# Patient Record
Sex: Female | Born: 1965 | Race: Black or African American | Hispanic: No | Marital: Married | Smoking: Never smoker
Health system: Southern US, Community
[De-identification: ages and names within clinical notes are randomized; demographics above are authoritative.]

## PROBLEM LIST (undated history)

## (undated) DIAGNOSIS — Z9221 Personal history of antineoplastic chemotherapy: Secondary | ICD-10-CM

## (undated) DIAGNOSIS — Z8042 Family history of malignant neoplasm of prostate: Secondary | ICD-10-CM

## (undated) DIAGNOSIS — K219 Gastro-esophageal reflux disease without esophagitis: Secondary | ICD-10-CM

## (undated) DIAGNOSIS — C50919 Malignant neoplasm of unspecified site of unspecified female breast: Secondary | ICD-10-CM

## (undated) DIAGNOSIS — I10 Essential (primary) hypertension: Secondary | ICD-10-CM

## (undated) DIAGNOSIS — Z803 Family history of malignant neoplasm of breast: Secondary | ICD-10-CM

## (undated) DIAGNOSIS — J45909 Unspecified asthma, uncomplicated: Secondary | ICD-10-CM

## (undated) DIAGNOSIS — Z8489 Family history of other specified conditions: Secondary | ICD-10-CM

## (undated) DIAGNOSIS — Z8049 Family history of malignant neoplasm of other genital organs: Secondary | ICD-10-CM

## (undated) HISTORY — DX: Unspecified asthma, uncomplicated: J45.909

## (undated) HISTORY — DX: Family history of malignant neoplasm of prostate: Z80.42

## (undated) HISTORY — DX: Family history of malignant neoplasm of other genital organs: Z80.49

## (undated) HISTORY — PX: ABLATION: SHX5711

## (undated) HISTORY — DX: Malignant neoplasm of unspecified site of unspecified female breast: C50.919

## (undated) HISTORY — DX: Essential (primary) hypertension: I10

## (undated) HISTORY — DX: Family history of malignant neoplasm of breast: Z80.3

---

## 2009-11-09 HISTORY — PX: BREAST BIOPSY: SHX20

## 2018-01-20 ENCOUNTER — Inpatient Hospital Stay
Admission: RE | Admit: 2018-01-20 | Discharge: 2018-01-20 | Disposition: A | Discharge status: Home / Self Care | Payer: Self-pay | Source: Ambulatory Visit | Attending: *Deleted | Admitting: *Deleted

## 2018-01-20 ENCOUNTER — Other Ambulatory Visit: Payer: Self-pay | Admitting: *Deleted

## 2018-01-20 DIAGNOSIS — Z9289 Personal history of other medical treatment: Secondary | ICD-10-CM

## 2018-01-24 ENCOUNTER — Other Ambulatory Visit: Payer: Self-pay | Admitting: Internal Medicine

## 2018-01-24 DIAGNOSIS — Z1231 Encounter for screening mammogram for malignant neoplasm of breast: Secondary | ICD-10-CM

## 2018-01-28 ENCOUNTER — Ambulatory Visit
Admission: RE | Admit: 2018-01-28 | Discharge: 2018-01-28 | Disposition: A | Discharge status: Home / Self Care | Payer: 59 | Source: Ambulatory Visit | Attending: Specialist | Admitting: Specialist

## 2018-01-28 ENCOUNTER — Other Ambulatory Visit: Payer: Self-pay | Admitting: Specialist

## 2018-01-28 DIAGNOSIS — M25469 Effusion, unspecified knee: Secondary | ICD-10-CM

## 2018-01-28 DIAGNOSIS — M79662 Pain in left lower leg: Secondary | ICD-10-CM | POA: Diagnosis not present

## 2018-01-28 DIAGNOSIS — M7989 Other specified soft tissue disorders: Secondary | ICD-10-CM | POA: Diagnosis not present

## 2018-02-04 ENCOUNTER — Ambulatory Visit
Admission: RE | Admit: 2018-02-04 | Discharge: 2018-02-04 | Disposition: A | Discharge status: Home / Self Care | Payer: 59 | Source: Ambulatory Visit | Attending: Internal Medicine | Admitting: Internal Medicine

## 2018-02-04 DIAGNOSIS — Z1231 Encounter for screening mammogram for malignant neoplasm of breast: Secondary | ICD-10-CM | POA: Diagnosis not present

## 2019-09-26 ENCOUNTER — Other Ambulatory Visit: Payer: Self-pay | Admitting: Family

## 2019-09-26 DIAGNOSIS — Z1231 Encounter for screening mammogram for malignant neoplasm of breast: Secondary | ICD-10-CM

## 2019-12-04 ENCOUNTER — Ambulatory Visit: Payer: No Typology Code available for payment source | Attending: Internal Medicine

## 2020-01-15 HISTORY — PX: COLONOSCOPY: SHX174

## 2020-02-11 DIAGNOSIS — Z421 Encounter for breast reconstruction following mastectomy: Secondary | ICD-10-CM

## 2020-02-11 DIAGNOSIS — C50912 Malignant neoplasm of unspecified site of left female breast: Secondary | ICD-10-CM | POA: Diagnosis not present

## 2020-04-10 DIAGNOSIS — J329 Chronic sinusitis, unspecified: Secondary | ICD-10-CM | POA: Diagnosis not present

## 2020-04-10 DIAGNOSIS — J309 Allergic rhinitis, unspecified: Secondary | ICD-10-CM | POA: Diagnosis not present

## 2020-04-23 DIAGNOSIS — J301 Allergic rhinitis due to pollen: Secondary | ICD-10-CM | POA: Diagnosis not present

## 2020-05-08 DIAGNOSIS — J309 Allergic rhinitis, unspecified: Secondary | ICD-10-CM | POA: Diagnosis not present

## 2020-05-13 DIAGNOSIS — L237 Allergic contact dermatitis due to plants, except food: Secondary | ICD-10-CM | POA: Diagnosis not present

## 2020-05-24 DIAGNOSIS — J301 Allergic rhinitis due to pollen: Secondary | ICD-10-CM | POA: Diagnosis not present

## 2020-06-03 DIAGNOSIS — J301 Allergic rhinitis due to pollen: Secondary | ICD-10-CM | POA: Diagnosis not present

## 2020-06-06 DIAGNOSIS — J301 Allergic rhinitis due to pollen: Secondary | ICD-10-CM | POA: Diagnosis not present

## 2020-06-13 DIAGNOSIS — J301 Allergic rhinitis due to pollen: Secondary | ICD-10-CM | POA: Diagnosis not present

## 2020-06-17 DIAGNOSIS — J301 Allergic rhinitis due to pollen: Secondary | ICD-10-CM | POA: Diagnosis not present

## 2020-06-18 DIAGNOSIS — J301 Allergic rhinitis due to pollen: Secondary | ICD-10-CM | POA: Diagnosis not present

## 2020-06-20 DIAGNOSIS — J301 Allergic rhinitis due to pollen: Secondary | ICD-10-CM | POA: Diagnosis not present

## 2020-06-24 DIAGNOSIS — J301 Allergic rhinitis due to pollen: Secondary | ICD-10-CM | POA: Diagnosis not present

## 2020-06-27 DIAGNOSIS — J301 Allergic rhinitis due to pollen: Secondary | ICD-10-CM | POA: Diagnosis not present

## 2020-07-01 DIAGNOSIS — Z20822 Contact with and (suspected) exposure to covid-19: Secondary | ICD-10-CM | POA: Diagnosis not present

## 2020-07-01 DIAGNOSIS — B9689 Other specified bacterial agents as the cause of diseases classified elsewhere: Secondary | ICD-10-CM | POA: Diagnosis not present

## 2020-07-01 DIAGNOSIS — J329 Chronic sinusitis, unspecified: Secondary | ICD-10-CM | POA: Diagnosis not present

## 2020-07-04 DIAGNOSIS — J301 Allergic rhinitis due to pollen: Secondary | ICD-10-CM | POA: Diagnosis not present

## 2020-07-18 DIAGNOSIS — J301 Allergic rhinitis due to pollen: Secondary | ICD-10-CM | POA: Diagnosis not present

## 2020-07-24 DIAGNOSIS — J301 Allergic rhinitis due to pollen: Secondary | ICD-10-CM | POA: Diagnosis not present

## 2020-08-12 DIAGNOSIS — J301 Allergic rhinitis due to pollen: Secondary | ICD-10-CM | POA: Diagnosis not present

## 2020-08-22 DIAGNOSIS — J301 Allergic rhinitis due to pollen: Secondary | ICD-10-CM | POA: Diagnosis not present

## 2020-08-29 DIAGNOSIS — J301 Allergic rhinitis due to pollen: Secondary | ICD-10-CM | POA: Diagnosis not present

## 2020-09-10 DIAGNOSIS — J301 Allergic rhinitis due to pollen: Secondary | ICD-10-CM | POA: Diagnosis not present

## 2020-09-12 DIAGNOSIS — J301 Allergic rhinitis due to pollen: Secondary | ICD-10-CM | POA: Diagnosis not present

## 2020-09-19 DIAGNOSIS — J301 Allergic rhinitis due to pollen: Secondary | ICD-10-CM | POA: Diagnosis not present

## 2020-09-26 DIAGNOSIS — J301 Allergic rhinitis due to pollen: Secondary | ICD-10-CM | POA: Diagnosis not present

## 2020-09-30 DIAGNOSIS — Z113 Encounter for screening for infections with a predominantly sexual mode of transmission: Secondary | ICD-10-CM | POA: Diagnosis not present

## 2020-09-30 DIAGNOSIS — R7302 Impaired glucose tolerance (oral): Secondary | ICD-10-CM | POA: Diagnosis not present

## 2020-09-30 DIAGNOSIS — I10 Essential (primary) hypertension: Secondary | ICD-10-CM | POA: Diagnosis not present

## 2020-09-30 DIAGNOSIS — J309 Allergic rhinitis, unspecified: Secondary | ICD-10-CM | POA: Diagnosis not present

## 2020-09-30 DIAGNOSIS — E782 Mixed hyperlipidemia: Secondary | ICD-10-CM | POA: Diagnosis not present

## 2020-09-30 DIAGNOSIS — E559 Vitamin D deficiency, unspecified: Secondary | ICD-10-CM | POA: Diagnosis not present

## 2020-09-30 DIAGNOSIS — Z0000 Encounter for general adult medical examination without abnormal findings: Secondary | ICD-10-CM | POA: Diagnosis not present

## 2020-09-30 DIAGNOSIS — J4520 Mild intermittent asthma, uncomplicated: Secondary | ICD-10-CM | POA: Diagnosis not present

## 2020-09-30 DIAGNOSIS — Z124 Encounter for screening for malignant neoplasm of cervix: Secondary | ICD-10-CM | POA: Diagnosis not present

## 2020-09-30 DIAGNOSIS — Z112 Encounter for screening for other bacterial diseases: Secondary | ICD-10-CM | POA: Diagnosis not present

## 2020-09-30 DIAGNOSIS — Z1151 Encounter for screening for human papillomavirus (HPV): Secondary | ICD-10-CM | POA: Diagnosis not present

## 2020-09-30 DIAGNOSIS — N760 Acute vaginitis: Secondary | ICD-10-CM | POA: Diagnosis not present

## 2020-10-10 DIAGNOSIS — J301 Allergic rhinitis due to pollen: Secondary | ICD-10-CM | POA: Diagnosis not present

## 2020-10-17 DIAGNOSIS — J301 Allergic rhinitis due to pollen: Secondary | ICD-10-CM | POA: Diagnosis not present

## 2020-10-24 DIAGNOSIS — J301 Allergic rhinitis due to pollen: Secondary | ICD-10-CM | POA: Diagnosis not present

## 2020-11-07 DIAGNOSIS — J301 Allergic rhinitis due to pollen: Secondary | ICD-10-CM | POA: Diagnosis not present

## 2020-11-08 DIAGNOSIS — M170 Bilateral primary osteoarthritis of knee: Secondary | ICD-10-CM | POA: Diagnosis not present

## 2020-11-09 HISTORY — PX: MASTECTOMY: SHX3

## 2020-11-14 DIAGNOSIS — J301 Allergic rhinitis due to pollen: Secondary | ICD-10-CM | POA: Diagnosis not present

## 2020-11-19 ENCOUNTER — Ambulatory Visit
Admission: RE | Admit: 2020-11-19 | Discharge: 2020-11-19 | Disposition: A | Discharge status: Home / Self Care | Payer: No Typology Code available for payment source | Source: Ambulatory Visit | Attending: Family | Admitting: Family

## 2020-11-19 ENCOUNTER — Other Ambulatory Visit: Payer: Self-pay

## 2020-11-19 DIAGNOSIS — Z1231 Encounter for screening mammogram for malignant neoplasm of breast: Secondary | ICD-10-CM | POA: Insufficient documentation

## 2020-11-21 DIAGNOSIS — J301 Allergic rhinitis due to pollen: Secondary | ICD-10-CM | POA: Diagnosis not present

## 2020-11-26 ENCOUNTER — Other Ambulatory Visit: Payer: Self-pay | Admitting: Family

## 2020-11-26 DIAGNOSIS — R921 Mammographic calcification found on diagnostic imaging of breast: Secondary | ICD-10-CM

## 2020-11-26 DIAGNOSIS — R928 Other abnormal and inconclusive findings on diagnostic imaging of breast: Secondary | ICD-10-CM

## 2020-11-28 DIAGNOSIS — J301 Allergic rhinitis due to pollen: Secondary | ICD-10-CM | POA: Diagnosis not present

## 2020-12-04 ENCOUNTER — Ambulatory Visit
Admission: RE | Admit: 2020-12-04 | Discharge: 2020-12-04 | Disposition: A | Discharge status: Home / Self Care | Payer: No Typology Code available for payment source | Source: Ambulatory Visit | Attending: Family | Admitting: Family

## 2020-12-04 ENCOUNTER — Other Ambulatory Visit: Payer: Self-pay

## 2020-12-04 ENCOUNTER — Other Ambulatory Visit: Payer: Self-pay | Admitting: Family

## 2020-12-04 DIAGNOSIS — R921 Mammographic calcification found on diagnostic imaging of breast: Secondary | ICD-10-CM

## 2020-12-04 DIAGNOSIS — N6489 Other specified disorders of breast: Secondary | ICD-10-CM | POA: Insufficient documentation

## 2020-12-04 DIAGNOSIS — R928 Other abnormal and inconclusive findings on diagnostic imaging of breast: Secondary | ICD-10-CM | POA: Insufficient documentation

## 2020-12-05 DIAGNOSIS — J301 Allergic rhinitis due to pollen: Secondary | ICD-10-CM | POA: Diagnosis not present

## 2020-12-06 ENCOUNTER — Other Ambulatory Visit: Payer: Self-pay | Admitting: Family

## 2020-12-06 DIAGNOSIS — J301 Allergic rhinitis due to pollen: Secondary | ICD-10-CM | POA: Diagnosis not present

## 2020-12-06 DIAGNOSIS — N6320 Unspecified lump in the left breast, unspecified quadrant: Secondary | ICD-10-CM

## 2020-12-06 DIAGNOSIS — R921 Mammographic calcification found on diagnostic imaging of breast: Secondary | ICD-10-CM

## 2020-12-06 DIAGNOSIS — R928 Other abnormal and inconclusive findings on diagnostic imaging of breast: Secondary | ICD-10-CM

## 2020-12-12 ENCOUNTER — Ambulatory Visit
Admission: RE | Admit: 2020-12-12 | Discharge: 2020-12-12 | Disposition: A | Discharge status: Home / Self Care | Payer: No Typology Code available for payment source | Source: Ambulatory Visit | Attending: Family | Admitting: Family

## 2020-12-12 ENCOUNTER — Other Ambulatory Visit: Payer: Self-pay | Admitting: Family

## 2020-12-12 ENCOUNTER — Other Ambulatory Visit: Payer: Self-pay

## 2020-12-12 DIAGNOSIS — D0512 Intraductal carcinoma in situ of left breast: Secondary | ICD-10-CM | POA: Diagnosis not present

## 2020-12-12 DIAGNOSIS — J301 Allergic rhinitis due to pollen: Secondary | ICD-10-CM | POA: Diagnosis not present

## 2020-12-12 DIAGNOSIS — R921 Mammographic calcification found on diagnostic imaging of breast: Secondary | ICD-10-CM

## 2020-12-12 DIAGNOSIS — N6320 Unspecified lump in the left breast, unspecified quadrant: Secondary | ICD-10-CM | POA: Diagnosis not present

## 2020-12-12 DIAGNOSIS — R928 Other abnormal and inconclusive findings on diagnostic imaging of breast: Secondary | ICD-10-CM

## 2020-12-12 DIAGNOSIS — C50412 Malignant neoplasm of upper-outer quadrant of left female breast: Secondary | ICD-10-CM | POA: Diagnosis not present

## 2020-12-12 DIAGNOSIS — N6489 Other specified disorders of breast: Secondary | ICD-10-CM | POA: Diagnosis not present

## 2020-12-12 HISTORY — PX: BREAST BIOPSY: SHX20

## 2020-12-13 ENCOUNTER — Encounter: Payer: Self-pay | Admitting: *Deleted

## 2020-12-13 DIAGNOSIS — C50912 Malignant neoplasm of unspecified site of left female breast: Secondary | ICD-10-CM

## 2020-12-13 NOTE — Progress Notes (Signed)
Received message from Electa Sniff, RN that patient had been notified of her new diagnosis of invasive mammary carcinoma of the left breast and was ready for navigation.  Called patient.  She and her husband were on the phone.  She would like to go stay local with her care.  [facility]ires [facility].  Reviewed pathology and answered questions.  Explained it would be Monday before I could schedule her appointments,  [place] number was given to the patient to call with any questions or needs.

## 2020-12-16 ENCOUNTER — Encounter: Payer: Self-pay | Admitting: *Deleted

## 2020-12-16 NOTE — Progress Notes (Signed)
Called patient back today with appointment to see Dr. Rogue Bussing on 12/18/20 @ 9:30 and Dr. Christian Mate on 2/10/ @ 3:45.  Questions were answered.  Will give educational material at her medical oncology appointment.

## 2020-12-17 ENCOUNTER — Inpatient Hospital Stay: Payer: No Typology Code available for payment source | Attending: Internal Medicine | Admitting: Internal Medicine

## 2020-12-17 ENCOUNTER — Encounter: Payer: Self-pay | Admitting: Internal Medicine

## 2020-12-17 ENCOUNTER — Encounter: Payer: Self-pay | Admitting: *Deleted

## 2020-12-17 ENCOUNTER — Inpatient Hospital Stay: Payer: No Typology Code available for payment source

## 2020-12-17 DIAGNOSIS — D0512 Intraductal carcinoma in situ of left breast: Secondary | ICD-10-CM | POA: Diagnosis not present

## 2020-12-17 DIAGNOSIS — D4862 Neoplasm of uncertain behavior of left breast: Secondary | ICD-10-CM

## 2020-12-17 NOTE — Progress Notes (Signed)
Met patient today during her medical oncology consult with Dr. Rogue Bussing.  Her husband was available via the phone.  ER/PR/Her2 are still pending.  Patient is scheduled to see surgeon on 12/19/20.  Patient is to return to see Dr. Rogue Bussing after her surgery.  Gave patient breast cancer educational literature, "My Breast Cancer Treatment Handbook" by Josephine Igo, RN.  She is to call with any questions or needs.

## 2020-12-17 NOTE — Progress Notes (Signed)
[facility] [name] NOTE  Patient Care Team: [name], MD as PCP - General (Internal Medicine)  CHIEF COMPLAINTS/PURPOSE OF CONSULTATION: Breast cancer  # On physical exam, there is a small firm palpable area in the lateral aspect of the left breast.  Targeted ultrasound is performed, showing an irregular shadowing hypoechoic mass at 3:30, 3 cm from the nipple measuring 1.4 x 0.8 x 1.0 cm.  Slightly closer to the nipple is another irregular hypoechoic mass with indistinct margins which has a linear echogenic focus, which may represent the biopsy marking clip from a prior biopsy. The associated mass measures 0.7 x 0.3 x 0.6 cm.  Closer to the nipple again at 3:30, 1 cm from the nipple is a small irregular hypoechoic mass measuring 0.6 x 0.3 x 0.5 cm. There is an internal echogenic focus, possibly a calcification.  All together, the masses at 3:30, 1-3 cm from the nipple are in a linear array and span approximately 3.7 cm.  DIAGNOSIS:  A. BREAST, LEFT AT 3:30 O'CLOCK, 3 CM FROM THE NIPPLE; ULTRASOUND-GUIDED  CORE NEEDLE BIOPSY:  - INVASIVE MAMMARY CARCINOMA, NO SPECIAL TYPE.   Size of invasive carcinoma: 11 mm in this sample  Histologic grade of invasive carcinoma: Grade 3            Glandular/tubular differentiation score: 3            Nuclear pleomorphism score: 3            Mitotic rate score: 3            Total score: 9  Ductal carcinoma in situ: Present, high-grade with comedonecrosis  Lymphovascular invasion: Not identified   ER/PR/HER2: Immunohistochemistry will be performed on block A1, with  reflex to [place] for HER2 2+. The results will be reported in an addendum.   B. BREAST, LEFT AT 3:30 O'CLOCK, 1 CM FROM THE NIPPLE; ULTRASOUND-GUIDED  CORE NEEDLE BIOPSY:  - DUCTAL CARCINOMA IN SITU, HIGH-GRADE.  - NO EVIDENCE OF INFILTRATING MAMMARY CARCINOMA.   #A] 3 cm from the nipple measuring 1.4 x 0.8 x  1.0 cm. 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     HISTORY OF PRESENTING ILLNESS:  Judy Padilla 55 y.o.  female female with no prior history of breast cancer/or malignancies has been referred to [place] for further evaluation recommendations for new diagnosis of breast cancer.  Patient stated that she had a breast biopsy 10 years ago on the left breast; marker placed.  She states to have mammogram on a yearly basis.  Surveillance mammogram in 2019.  Disrupted because of Covid pandemic.   Patient states she was found to have an abnormal screening mammogram which led to diagnostic mammogram/ultrasound/followed by biopsy-as summarized above.  Patient has intermittent hot flashes.  Otherwise denies any antihormone therapy.  Used OCP: for 1 year  Used estrogen and progesterone therapy: none History of Radiation to the chest: none  Number of pregnancies: one pregnancy.  Previous biopsy: 10 years ago; needed closer follow up. Last mammogram 2 years ago/sec to pandemic.  Last menstrual cycle- 2020 mid;  2014-2015 uterine ablation;    Review of Systems  Constitutional: Negative for chills, diaphoresis, fever, malaise/fatigue and weight loss.  HENT: Negative for nosebleeds and sore throat.   Eyes: Negative for double vision.  Respiratory:  Negative for cough, hemoptysis, sputum production, shortness of breath and wheezing.   Cardiovascular: Negative for chest pain, palpitations, orthopnea and leg swelling.  Gastrointestinal: Negative for abdominal pain, blood in stool, constipation, diarrhea, heartburn, melena, nausea and vomiting.  Genitourinary: Negative for dysuria, frequency and urgency.  Musculoskeletal: Negative for back pain and joint pain.  Skin: Negative.  Negative for itching and rash.  Neurological: Negative for dizziness,  tingling, focal weakness, weakness and headaches.  Endo/Heme/Allergies: Does not bruise/bleed easily.  Psychiatric/Behavioral: Negative for depression. The patient is not nervous/anxious and does not have insomnia.      MEDICAL HISTORY:  Past Medical History:  Diagnosis Date  . Asthma   . Breast cancer ([place])   . Hypertension     SURGICAL HISTORY: Past Surgical History:  Procedure Laterality Date  . BREAST BIOPSY Left 2011   Benign per pt  . BREAST BIOPSY Left 12/12/2020   3:30 3 cmfn, Q marker, path pending  . BREAST BIOPSY Left 12/12/2020   3:30 1 cmfn, Vision marker, path pending  . COLONOSCOPY  01/15/2020    SOCIAL HISTORY: Social History   Socioeconomic History  . Marital status: Married    Spouse name: Not on file  . Number of children: Not on file  . Years of education: Not on file  . Highest education level: Not on file  Occupational History  . Not on file  Tobacco Use  . Smoking status: Never Smoker  . Smokeless tobacco: Never Used  Substance and Sexual Activity  . Alcohol use: Not Currently  . Drug use: Never  . Sexual activity: Not on file  Other Topics Concern  . Not on file  Social History Narrative   Lives in [place] with husband; kids- college. Works for [employer]- working from home. No smoking or alcohol.    Social Determinants of Health   Financial Resource Strain: Not on file  Food Insecurity: Not on file  Transportation Needs: Not on file  Physical Activity: Not on file  Stress: Not on file  Social Connections: Not on file  Intimate Partner Violence: Not on file    FAMILY HISTORY: Family History  Problem Relation Age of Onset  . Hypertension Mother   . Diabetes Mother   . Hypertension Father   . Diabetes Father   . Cancer Father        prostate cancer-70s  . Cancer Maternal Grandmother        uterine cancer  . Breast cancer Sister        in in 67s.     ALLERGIES:  is allergic to acetaminophen-caffeine and  shrimp extract allergy skin test.  MEDICATIONS:  Current Outpatient Medications  Medication Sig Dispense Refill  . albuterol (VENTOLIN HFA) 108 (90 Base) MCG/ACT inhaler Inhale 1 puff into the lungs as needed for shortness of breath.    Marland Kitchen amLODipine (NORVASC) 10 MG tablet Take 1 tablet by mouth daily.    Marland Kitchen atorvastatin (LIPITOR) 20 MG tablet Take 1 tablet by mouth daily.    . budesonide (PULMICORT) 0.5 MG/2ML nebulizer solution Take 2 mLs by nebulization as needed for shortness of breath.    . cetirizine (ZYRTEC) 10 MG tablet Take 1 tablet by mouth daily.    . Cholecalciferol 1.25 MG (50000 UT) capsule Take 1 capsule by mouth once a week.    Marland Kitchen EPINEPHrine 0.3 mg/0.3 mL IJ SOAJ injection epinephrine 0.3 mg/0.3 mL injection, auto-injector    . fluticasone (FLONASE) 50 MCG/ACT nasal spray Place 1  spray into both nostrils daily.    . fluticasone furoate-vilanterol (BREO ELLIPTA) 100-25 MCG/INH AEPB Inhale 1 puff into the lungs as needed for shortness of breath.    . naproxen sodium (ALEVE) 220 MG tablet Take 1 tablet by mouth in the morning and at bedtime.    . pantoprazole (PROTONIX) 40 MG tablet Take 1 tablet by mouth daily.     No current facility-administered medications for this visit.      Marland Kitchen  PHYSICAL EXAMINATION: ECOG PERFORMANCE STATUS: 0 - Asymptomatic  Vitals:   12/17/20 0954  BP: 121/88  Pulse: 76  Resp: 16  Temp: (!) 96.5 F (35.8 C)  SpO2: 100%   Filed Weights   12/17/20 0954  Weight: 192 lb (87.1 kg)    Physical Exam Constitutional:      Comments: Alone; ambulating independently.  HENT:     Head: Normocephalic and atraumatic.     Mouth/Throat:     Mouth: Oropharynx is clear and moist.     Pharynx: No oropharyngeal exudate.  Eyes:     Pupils: Pupils are equal, round, and reactive to light.  Cardiovascular:     Rate and Rhythm: Normal rate and regular rhythm.  Pulmonary:     Effort: Pulmonary effort is normal. No respiratory distress.     Breath sounds:  Normal breath sounds. No wheezing.  Abdominal:     General: Bowel sounds are normal. There is no distension.     Palpations: Abdomen is soft. There is no mass.     Tenderness: There is no abdominal tenderness. There is no guarding or rebound.  Musculoskeletal:        General: No tenderness or edema. Normal range of motion.     Cervical back: Normal range of motion and neck supple.  Skin:    General: Skin is warm.     Comments: Right and left BREAST exam (in the presence of nurse)- no unusual skin changes. ~1-2 cm nodules felt in the left breast 3 o'clock position.  Mobile.  Nontender.  Biopsy changes noted.   Neurological:     Mental Status: She is alert and oriented to person, place, and time.  Psychiatric:        Mood and Affect: Affect normal.      LABORATORY DATA:  I have reviewed the data as listed No results found for: WBC, HGB, HCT, MCV, PLT No results for input(s): NA, K, CL, CO2, GLUCOSE, BUN, CREATININE, CALCIUM, GFRNONAA, GFRAA, PROT, ALBUMIN, AST, ALT, ALKPHOS, BILITOT, BILIDIR, IBILI in the last 8760 hours.  RADIOGRAPHIC STUDIES: I have personally reviewed the radiological images as listed and agreed with the findings in the report. MM Digital Diagnostic Unilat L  Result Date: 12/04/2020 CLINICAL DATA:  55 year old female presenting as a recall from screening for possible left breast calcifications. EXAM: DIGITAL DIAGNOSTIC LEFT MAMMOGRAM WITH TOMOSYNTHESIS TECHNIQUE: Left digital diagnostic mammography and breast tomosynthesis was performed. COMPARISON:  Previous exam(s). ACR Breast Density Category c: The breast tissue is heterogeneously dense, which may obscure small masses. FINDINGS: Spot 2D magnification views and full field mL views of the left breast performed. There are grouped pleomorphic calcifications in the outer left breast spanning 1.8 cm. There is an associated asymmetry. No additional new findings identified elsewhere in the left breast. IMPRESSION:  Suspicious calcifications spanning 1.8 cm in the outer left breast with an associated asymmetry. Patient was not able to stay for recommended left breast ultrasound. RECOMMENDATION: 1.  Left breast ultrasound. 2. If a mass is  identified in the outer left breast associated with the calcifications seen mammographically, recommend ultrasound-guided core needle biopsy of this area. If no sonographic correlate is identified, recommend stereotactic core needle biopsy of the left breast calcifications. I was not able to discussed the recommendations with the patient in person today because she left in order to return to work. She will be contacted by our scheduler to make the follow-up appointments. BI-RADS CATEGORY  4: Suspicious. Electronically Signed   By: Audie Pinto M.D.   On: 12/04/2020 15:32   US BREAST LTD UNI LEFT INC AXILLA  Result Date: 12/12/2020 CLINICAL DATA:  55 year old female presenting for ultrasound to complete the diagnostic workup of a left breast asymmetry with calcifications identified on her screening mammogram. EXAM: ULTRASOUND OF THE LEFT BREAST COMPARISON:  Previous exam(s). FINDINGS: On physical exam, there is a small firm palpable area in the lateral aspect of the left breast. Targeted ultrasound is performed, showing an irregular shadowing hypoechoic mass at 3:30, 3 cm from the nipple measuring 1.4 x 0.8 x 1.0 cm. Slightly closer to the nipple is another irregular hypoechoic mass with indistinct margins which has a linear echogenic focus, which may represent the biopsy marking clip from a prior biopsy. The associated mass measures 0.7 x 0.3 x 0.6 cm. Closer to the nipple again at 3:30, 1 cm from the nipple is a small irregular hypoechoic mass measuring 0.6 x 0.3 x 0.5 cm. There is an internal echogenic focus, possibly a calcification. All together, the masses at 3:30, 1-3 cm from the nipple are in a linear array and span approximately 3.7 cm. Ultrasound of the left axilla demonstrates  multiple normal-appearing lymph nodes. IMPRESSION: 1. There are 3 masses in a linear array at 3:30 which span approximately 3.7 cm. 2.  No evidence of left axillary lymphadenopathy. RECOMMENDATION: Ultrasound guided biopsy is recommended for the left breast mass at 3:30, 1 cm from the nipple and at 3:30, 3 cm from the nipple. This will be performed today, and dictated in a separate report. I have discussed the findings and recommendations with the patient. If applicable, a reminder letter will be sent to the patient regarding the next appointment. BI-RADS CATEGORY  5: Highly suggestive of malignancy. Electronically Signed   By: Ammie Ferrier M.D.   On: 12/12/2020 09:24   MM 3D SCREEN BREAST BILATERAL  Result Date: 11/19/2020 CLINICAL DATA:  Screening. EXAM: DIGITAL SCREENING BILATERAL MAMMOGRAM WITH TOMO AND CAD COMPARISON:  Previous exam(s). ACR Breast Density Category c: The breast tissue is heterogeneously dense, which may obscure small masses. FINDINGS: In the left breast, calcifications in the outer aspect of the breast in the middle depth warrant further evaluation. In the right breast, no findings suspicious for malignancy. Images were processed with CAD. IMPRESSION: Further evaluation is suggested for calcifications in the left breast. RECOMMENDATION: Diagnostic mammogram of the left breast. (Code:[id number]) The patient will be contacted regarding the findings, and additional imaging will be scheduled. BI-RADS CATEGORY  0: Incomplete. Need additional imaging evaluation and/or prior mammograms for comparison. Electronically Signed   By: Claudie Revering M.D.   On: 11/19/2020 11:45   MM CLIP PLACEMENT LEFT  Result Date: 12/12/2020 CLINICAL DATA:  Post biopsy mammogram of the left breast for clip placement. EXAM: DIAGNOSTIC LEFT MAMMOGRAM POST ULTRASOUND BIOPSY COMPARISON:  Previous exam(s). FINDINGS: Mammographic images were obtained following ultrasound guided biopsy of 2 masses in the lateral left  breast. The biopsy marking clips are in expected position at the sites of biopsy.  The distal margin of the mass with calcifications in the lateral left breast spans 4.1 cm including the vision tissue marker at the second site of biopsy. There is a 0.9 cm oval mass in the central left breast, just anterior to the open spring shaped biopsy marking clip from the patient's previous biopsy. This mass is not clearly seen on the patient's 2019 or 2016 mammograms, though those studies were not performed with tomosynthesis. IMPRESSION: 1. Appropriate positioning of the Q and vision shaped biopsy marking clip at the sites of biopsy in the lateral left breast. 2. There is a 0.9 cm oval mass just anterior to the biopsy marking clip from the patient's prior benign biopsy. This was not seen on the 2019 or 2016 mammograms. Final Assessment: Post Procedure Mammograms for Marker Placement Electronically Signed   By: Ammie Ferrier M.D.   On: 12/12/2020 09:29   [place] LT BREAST BX W LOC DEV 1ST LESION IMG BX SPEC US GUIDE  Addendum Date: 12/13/2020   ADDENDUM REPORT: 12/13/2020 14:56 ADDENDUM: PATHOLOGY revealed: Site A. BREAST, LEFT AT 3:30 O'CLOCK, 3 CM FROM THE NIPPLE; ULTRASOUND-GUIDED CORE NEEDLE BIOPSY: - INVASIVE MAMMARY CARCINOMA, NO SPECIAL TYPE. 11 mm in this sample. Grade 3. Ductal carcinoma in situ: Present, high-grade with comedonecrosis. Lymphovascular invasion: Not identified. Pathology results are CONCORDANT with imaging findings, per Dr. Ammie Ferrier. PATHOLOGY revealed: Site B. BREAST, LEFT AT 3:30 O'CLOCK, 1 CM FROM THE NIPPLE; ULTRASOUND-GUIDED CORE NEEDLE BIOPSY: - DUCTAL CARCINOMA IN SITU, HIGH-GRADE. - NO EVIDENCE OF INFILTRATING MAMMARY CARCINOMA. Pathology results are CONCORDANT with imaging findings, per Dr. Ammie Ferrier. Pathology results and recommendations below were discussed with patient by telephone on 12/13/2020. Patient reported biopsy site within normal limits with slight tenderness at the  site. Post biopsy care instructions were reviewed, questions were answered and my direct phone number was provided to patient. Patient was instructed to call [facility] if any concerns or questions arise related to the biopsy. Recommendations: 1. Surgical consultation. Request for surgical consultation relayed to Al Pimple RN and Tanya Nones RN at [facility] by Electa Sniff RN on 12/13/2020. Note that there is abnormal tissue seen between the two sites of biopsy in the left breast, and bracketing is recommended for localization prior to surgery if the patient undergoes breast conservation. 2. MRI recommended to evaluate extent of disease due to the multifocal disease and high grade DCIS, with other possible sites of disease seen on mammogram and ultrasound. The patient also has dense, heterogeneous breast tissue. If no other sites of disease are seen for targeting on MRI, recommend stereotactic guided biopsy of the 62m round mass anterior to the spring shaped biopsy marking clip in the left breast on the mammogram. Pathology results reported by LElecta SniffRN on 12/13/2020. Electronically Signed   By: MAmmie FerrierM.D.   On: 12/13/2020 14:56   Result Date: 12/13/2020 CLINICAL DATA:  55year old female presenting for ultrasound-guided biopsy of 2 left breast masses. EXAM: ULTRASOUND GUIDED LEFT BREAST CORE NEEDLE BIOPSY COMPARISON:  Previous exam(s). FINDINGS: I met with the patient and we discussed the procedure of ultrasound-guided biopsy, including benefits and alternatives. We discussed the high likelihood of a successful procedure. We discussed the risks of the procedure, including infection, bleeding, tissue injury, clip migration, and inadequate sampling. Informed written consent was given. The usual time-out protocol was performed immediately prior to the procedure. #1 Lesion quadrant: Lower outer quadrant Using sterile technique and 1% Lidocaine as local anesthetic, under  direct ultrasound visualization, a 14 gauge spring-loaded device was used to perform biopsy of a mass in the left breast at 3:30, 3 cm from the nipple using an inferior approach. At the conclusion of the procedure a Q shaped tissue marker clip was deployed into the biopsy cavity. -------------------------------------------------------------------------------------------------------------------------------------------- #2 Lesion quadrant: Lower outer quadrant Using sterile technique and 1% Lidocaine as local anesthetic, under direct ultrasound visualization, a 14 gauge spring-loaded device was used to perform biopsy of a mass in the left breast at 3:30, 1 cm from the nipple using an inferior approach. At the conclusion of the procedure a vision tissue marker clip was deployed into the biopsy cavity. Follow up 2 view mammogram was performed and dictated separately. IMPRESSION: 1. Ultrasound guided biopsy of a left breast mass at 3:30, 3 cm from the nipple. No apparent complications. 2. Ultrasound guided biopsy of a left breast mass at 3:30, 1 cm from the nipple. No apparent complications. Electronically Signed: By: Ammie Ferrier M.D. On: 12/12/2020 09:26   [place] LT BREAST BX W LOC DEV EA ADD LESION IMG BX SPEC US GUIDE  Addendum Date: 12/13/2020   ADDENDUM REPORT: 12/13/2020 14:56 ADDENDUM: PATHOLOGY revealed: Site A. BREAST, LEFT AT 3:30 O'CLOCK, 3 CM FROM THE NIPPLE; ULTRASOUND-GUIDED CORE NEEDLE BIOPSY: - INVASIVE MAMMARY CARCINOMA, NO SPECIAL TYPE. 11 mm in this sample. Grade 3. Ductal carcinoma in situ: Present, high-grade with comedonecrosis. Lymphovascular invasion: Not identified. Pathology results are CONCORDANT with imaging findings, per Dr. Ammie Ferrier. PATHOLOGY revealed: Site B. BREAST, LEFT AT 3:30 O'CLOCK, 1 CM FROM THE NIPPLE; ULTRASOUND-GUIDED CORE NEEDLE BIOPSY: - DUCTAL CARCINOMA IN SITU, HIGH-GRADE. - NO EVIDENCE OF INFILTRATING MAMMARY CARCINOMA. Pathology results are CONCORDANT with  imaging findings, per Dr. Ammie Ferrier. Pathology results and recommendations below were discussed with patient by telephone on 12/13/2020. Patient reported biopsy site within normal limits with slight tenderness at the site. Post biopsy care instructions were reviewed, questions were answered and my direct phone number was provided to patient. Patient was instructed to call [facility] if any concerns or questions arise related to the biopsy. Recommendations: 1. Surgical consultation. Request for surgical consultation relayed to Al Pimple RN and Tanya Nones RN at [facility] by Electa Sniff RN on 12/13/2020. Note that there is abnormal tissue seen between the two sites of biopsy in the left breast, and bracketing is recommended for localization prior to surgery if the patient undergoes breast conservation. 2. MRI recommended to evaluate extent of disease due to the multifocal disease and high grade DCIS, with other possible sites of disease seen on mammogram and ultrasound. The patient also has dense, heterogeneous breast tissue. If no other sites of disease are seen for targeting on MRI, recommend stereotactic guided biopsy of the 49m round mass anterior to the spring shaped biopsy marking clip in the left breast on the mammogram. Pathology results reported by LElecta SniffRN on 12/13/2020. Electronically Signed   By: MAmmie FerrierM.D.   On: 12/13/2020 14:56   Result Date: 12/13/2020 CLINICAL DATA:  55year old female presenting for ultrasound-guided biopsy of 2 left breast masses. EXAM: ULTRASOUND GUIDED LEFT BREAST CORE NEEDLE BIOPSY COMPARISON:  Previous exam(s). FINDINGS: I met with the patient and we discussed the procedure of ultrasound-guided biopsy, including benefits and alternatives. We discussed the high likelihood of a successful procedure. We discussed the risks of the procedure, including infection, bleeding, tissue injury, clip migration, and inadequate sampling.  Informed written consent was given. The usual time-out  protocol was performed immediately prior to the procedure. #1 Lesion quadrant: Lower outer quadrant Using sterile technique and 1% Lidocaine as local anesthetic, under direct ultrasound visualization, a 14 gauge spring-loaded device was used to perform biopsy of a mass in the left breast at 3:30, 3 cm from the nipple using an inferior approach. At the conclusion of the procedure a Q shaped tissue marker clip was deployed into the biopsy cavity. -------------------------------------------------------------------------------------------------------------------------------------------- #2 Lesion quadrant: Lower outer quadrant Using sterile technique and 1% Lidocaine as local anesthetic, under direct ultrasound visualization, a 14 gauge spring-loaded device was used to perform biopsy of a mass in the left breast at 3:30, 1 cm from the nipple using an inferior approach. At the conclusion of the procedure a vision tissue marker clip was deployed into the biopsy cavity. Follow up 2 view mammogram was performed and dictated separately. IMPRESSION: 1. Ultrasound guided biopsy of a left breast mass at 3:30, 3 cm from the nipple. No apparent complications. 2. Ultrasound guided biopsy of a left breast mass at 3:30, 1 cm from the nipple. No apparent complications. Electronically Signed: By: Ammie Ferrier M.D. On: 12/12/2020 09:26    ASSESSMENT & PLAN:   Neoplasm of uncertain behavior of lower outer quadrant of female breast, left # Left breast 3 o'clock position left breast. 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  # I had a long discussion with the patient in general regarding the treatment options of breast cancer including-surgery; adjuvant radiation; role of adjuvant systemic therapy including-chemotherapy antihormone therapy.   # Patient will likely need lumpectomy with sentinel  lymph node evaluation; followed by radiation. Decision regarding chemotherapy based on final surgical pathology/gene assay.  Await ER/PR status to decide on endocrine therapy.   # Genetics: Patient meets the criteria for genetics; sister diagnosed with breast cancer.  Would recommend genetic counseling/down the line.   Thank you  for allowing me to participate in the care of your pleasant patient. Please do not hesitate to contact me with questions or concerns in the interim.  Spoke with [employer].  Also spoke to patient's husband over the phone.  # DISPOSITION: # follow up TBD- Dr.B   All questions were answered. The patient/family knows to call the clinic with any problems, questions or concerns.    Cammie Sickle, MD 12/17/2020 11:31 AM

## 2020-12-17 NOTE — Assessment & Plan Note (Addendum)
#  Left breast 3 o'clock position left breast. 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  # I had a long discussion with the patient in general regarding the treatment options of breast cancer including-surgery; adjuvant radiation; role of adjuvant systemic therapy including-chemotherapy antihormone therapy.   # Patient will likely need lumpectomy with sentinel lymph node evaluation; followed by radiation. Decision regarding chemotherapy based on final surgical pathology/gene assay.  Await ER/PR status to decide on endocrine therapy.   # Genetics: Patient meets the criteria for genetics; sister diagnosed with breast cancer.  Would recommend genetic counseling/down the line.   Thank you  for allowing me to participate in the care of your pleasant patient. Please do not hesitate to contact me with questions or concerns in the interim.  Spoke with [employer].  Also spoke to patient's husband over the phone.  # DISPOSITION: # follow up TBD- Dr.B

## 2020-12-19 ENCOUNTER — Ambulatory Visit (INDEPENDENT_AMBULATORY_CARE_PROVIDER_SITE_OTHER): Payer: No Typology Code available for payment source | Admitting: Surgery

## 2020-12-19 ENCOUNTER — Other Ambulatory Visit: Payer: Self-pay

## 2020-12-19 ENCOUNTER — Encounter: Payer: Self-pay | Admitting: Surgery

## 2020-12-19 ENCOUNTER — Ambulatory Visit: Payer: No Typology Code available for payment source | Admitting: Surgery

## 2020-12-19 VITALS — BP 132/88 | HR 82 | Temp 99.0°F | Ht 68.0 in | Wt 192.4 lb

## 2020-12-19 DIAGNOSIS — C50912 Malignant neoplasm of unspecified site of left female breast: Secondary | ICD-10-CM

## 2020-12-19 NOTE — Progress Notes (Signed)
Patient ID: Judy Padilla, female   DOB: [birth year], 55 y.o.   MRN: [id number]  Chief Complaint: Left breast cancer  History of Present Illness Judy Padilla is a 55 y.o. female with recent diagnosis of left breast cancer, multifocal at 330 of the left breast.  At 3 cm from nipple is an invasive carcinoma about 11 mm on the sample, grade 3.  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  It was thought prudent to proceed with MRI to better determine the extent of her DCIS. Her biopsy was on February 3, and her prognostic indicators were not back yet.  She utilized birth control for about 1 year in 2000.  She is postmenopausal.  She has a family history of breast cancer in her sister.  She began menstruating at the age of 88 she has been pregnant once at the age of 79.  She has not felt a mass, noted any discharge from the nipple or any skin changes.  She has had no breast pain and she does not do monthly exams but she does do yearly mammography.   Past Medical History Past Medical History:  Diagnosis Date  . Asthma   . Breast cancer ([place])   . Hypertension       Past Surgical History:  Procedure Laterality Date  . ABLATION    . BREAST BIOPSY Left 2011   Benign per pt  . BREAST BIOPSY Left 12/12/2020   3:30 3 cmfn, Q marker, path pending  . BREAST BIOPSY Left 12/12/2020   3:30 1 cmfn, Vision marker, path pending  . COLONOSCOPY  01/15/2020    Allergies  Allergen Reactions  . Acetaminophen-Caffeine Swelling  . Shrimp Extract Allergy Skin Test Shortness Of Breath    Current Outpatient Medications  Medication Sig Dispense Refill  . albuterol (VENTOLIN HFA) 108 (90 Base) MCG/ACT inhaler Inhale 1 puff into the lungs as needed for shortness of breath.    Marland Kitchen amLODipine (NORVASC) 10 MG tablet Take 1 tablet by mouth daily.    Marland Kitchen atorvastatin (LIPITOR) 20 MG tablet Take 1 tablet by mouth daily.    . budesonide (PULMICORT) 0.5 MG/2ML nebulizer solution Take 2 mLs by nebulization as  needed for shortness of breath.    . cetirizine (ZYRTEC) 10 MG tablet Take 1 tablet by mouth daily.    . Cholecalciferol 1.25 MG (50000 UT) capsule Take 1 capsule by mouth once a week.    Marland Kitchen EPINEPHrine 0.3 mg/0.3 mL IJ SOAJ injection epinephrine 0.3 mg/0.3 mL injection, auto-injector    . fluticasone (FLONASE) 50 MCG/ACT nasal spray Place 1 spray into both nostrils daily.    . fluticasone furoate-vilanterol (BREO ELLIPTA) 100-25 MCG/INH AEPB Inhale 1 puff into the lungs as needed for shortness of breath.    . naproxen sodium (ALEVE) 220 MG tablet Take 1 tablet by mouth in the morning and at bedtime.    . pantoprazole (PROTONIX) 40 MG tablet Take 1 tablet by mouth daily.     No current facility-administered medications for this visit.    Family History Family History  Problem Relation Age of Onset  . Hypertension Mother   . Diabetes Mother   . Hypertension Father   . Diabetes Father   . Cancer Father        prostate cancer-70s  . Cancer Maternal Grandmother        uterine cancer  . Breast cancer Sister        in in 21s.  Social History Social History   Tobacco Use  . Smoking status: Never Smoker  . Smokeless tobacco: Never Used  Substance Use Topics  . Alcohol use: Not Currently  . Drug use: Never        Review of Systems  Constitutional: Negative.   HENT: Negative.   Eyes: Negative.   Respiratory: Negative.   Cardiovascular: Negative.   Gastrointestinal: Negative.   Genitourinary: Negative.   Skin: Negative.   Neurological: Negative.   Psychiatric/Behavioral: Negative.       Physical Exam Blood pressure 132/88, pulse 82, temperature 99 F (37.2 C), temperature source Oral, height 5\' 8"  (1.727 m), weight 192 lb 6.4 oz (87.3 kg), last menstrual period 01/04/2018. Last Weight  Most recent update: 12/19/2020  2:15 PM   Weight  87.3 kg (192 lb 6.4 oz)            CONSTITUTIONAL: Well developed, and nourished, appropriately responsive and aware without  distress.   EYES: Sclera non-icteric.   EARS, NOSE, MOUTH AND THROAT: Mask worn.   Hearing is intact to voice.  NECK: Trachea is midline, and there is no jugular venous distension.  LYMPH NODES:  Lymph nodes in the neck are not enlarged. RESPIRATORY:  Lungs are clear, and breath sounds are equal bilaterally. Normal respiratory effort without pathologic use of accessory muscles. CARDIOVASCULAR: Heart is regular in rate and rhythm. GI: The abdomen is soft, nontender, and nondistended. There were no palpable masses. I did not appreciate hepatosplenomegaly. There were normal bowel sounds. GU: Her breast exam is unremarkable aside from the postbiopsy changes in the outer lower quadrant of the left breast.  No suspicious nor dominant masses in either breast present as per exam post biopsy. MUSCULOSKELETAL:  Symmetrical muscle tone appreciated in all four extremities.    SKIN: Skin turgor is normal. No pathologic skin lesions appreciated.  NEUROLOGIC:  Motor and sensation appear grossly normal.  Cranial nerves are grossly without defect. PSYCH:  Alert and oriented to person, place and time. Affect is appropriate for situation.  Data Reviewed I have personally reviewed what is currently available of the patient's imaging, recent labs and medical records.   Labs:  No flowsheet data found. No flowsheet data found.    Imaging: Radiology review:   CLINICAL DATA:  Screening.  EXAM: DIGITAL SCREENING BILATERAL MAMMOGRAM WITH TOMO AND CAD  COMPARISON:  Previous exam(s).  ACR Breast Density Category c: The breast tissue is heterogeneously dense, which may obscure small masses.  FINDINGS: In the left breast, calcifications in the outer aspect of the breast in the middle depth warrant further evaluation. In the right breast, no findings suspicious for malignancy. Images were processed with CAD.  IMPRESSION: Further evaluation is suggested for calcifications in the  left breast.  RECOMMENDATION: Diagnostic mammogram of the left breast. (Code:FI-L-34M)  The patient will be contacted regarding the findings, and additional imaging will be scheduled.  BI-RADS CATEGORY  0: Incomplete. Need additional imaging evaluation and/or prior mammograms for comparison.   Electronically Signed   By: Claudie Revering M.D.   On: 11/19/2020 11:45 CLINICAL DATA:  55 year old female presenting as a recall from screening for possible left breast calcifications.  EXAM: DIGITAL DIAGNOSTIC LEFT MAMMOGRAM WITH TOMOSYNTHESIS  TECHNIQUE: Left digital diagnostic mammography and breast tomosynthesis was performed.  COMPARISON:  Previous exam(s).  ACR Breast Density Category c: The breast tissue is heterogeneously dense, which may obscure small masses.  FINDINGS: Spot 2D magnification views and full field mL views of the left breast  performed. There are grouped pleomorphic calcifications in the outer left breast spanning 1.8 cm. There is an associated asymmetry. No additional new findings identified elsewhere in the left breast.  IMPRESSION: Suspicious calcifications spanning 1.8 cm in the outer left breast with an associated asymmetry. Patient was not able to stay for recommended left breast ultrasound.  RECOMMENDATION: 1.  Left breast ultrasound.  2. If a mass is identified in the outer left breast associated with the calcifications seen mammographically, recommend ultrasound-guided core needle biopsy of this area. If no sonographic correlate is identified, recommend stereotactic core needle biopsy of the left breast calcifications.  I was not able to discussed the recommendations with the patient in person today because she left in order to return to work. She will be contacted by our scheduler to make the follow-up appointments.  BI-RADS CATEGORY  4: Suspicious.   Electronically Signed   By: Audie Pinto M.D.   On: 12/04/2020  15:32  ADDENDUM REPORT: 12/13/2020 14:56  ADDENDUM: PATHOLOGY revealed: Site A. BREAST, LEFT AT 3:30 O'CLOCK, 3 CM FROM THE NIPPLE; ULTRASOUND-GUIDED CORE NEEDLE BIOPSY: - INVASIVE MAMMARY CARCINOMA, NO SPECIAL TYPE. 11 mm in this sample. Grade 3. Ductal carcinoma in situ: Present, high-grade with comedonecrosis. Lymphovascular invasion: Not identified.  Pathology results are CONCORDANT with imaging findings, per Dr. Ammie Ferrier.  PATHOLOGY revealed: Site B. BREAST, LEFT AT 3:30 O'CLOCK, 1 CM FROM THE NIPPLE; ULTRASOUND-GUIDED CORE NEEDLE BIOPSY: - DUCTAL CARCINOMA IN SITU, HIGH-GRADE. - NO EVIDENCE OF INFILTRATING MAMMARY CARCINOMA.  Pathology results are CONCORDANT with imaging findings, per Dr. Ammie Ferrier.  Pathology results and recommendations below were discussed with patient by telephone on 12/13/2020. Patient reported biopsy site within normal limits with slight tenderness at the site. Post biopsy care instructions were reviewed, questions were answered and my direct phone number was provided to patient. Patient was instructed to call [facility] if any concerns or questions arise related to the biopsy.  Recommendations:  1. Surgical consultation. Request for surgical consultation relayed to Al Pimple RN and Tanya Nones RN at [facility] by Electa Sniff RN on 12/13/2020. Note that there is abnormal tissue seen between the two sites of biopsy in the left breast, and bracketing is recommended for localization prior to surgery if the patient undergoes breast conservation.  2. MRI recommended to evaluate extent of disease due to the multifocal disease and high grade DCIS, with other possible sites of disease seen on mammogram and ultrasound. The patient also has dense, heterogeneous breast tissue. If no other sites of disease are seen for targeting on MRI, recommend stereotactic guided biopsy of the 79mm round mass  anterior to the spring shaped biopsy marking clip in the left breast on the mammogram.  Pathology results reported by Electa Sniff RN on 12/13/2020.   Electronically Signed   By: Ammie Ferrier M.D.   On: 12/13/2020 14:56   Assessment    Invasive mammary carcinoma left breast, DCIS left breast.  Multifocal. Patient Active Problem List   Diagnosis Date Noted  . Neoplasm of uncertain behavior of lower outer quadrant of female breast, left 12/17/2020    Plan    We will follow-up prognostic indicators. Will obtain breast MRI, currently scheduled for December 23, 2020. Patient to follow-up in the office Tuesday, February 15, anticipating results of the above. Today we reviewed various case scenarios and hypotheticals.  We discussed them at length answering multiple questions.  We can be much more definitive on Tuesday, and likely  proceed with scheduling surgery. We also proceeded with scheduling consultation for plastic surgery, as one hypothetical raised has the possibility of either oncoplastic surgery or mastectomy. We discussed the role of radiation treatment and breast conservation.  We also discussed the goals of breast conservation requiring negative margins.  If she elected to proceed with mastectomy, or is limited to that option, she would definitely wish to consider plastic surgical for immediate reconstruction.  Face-to-face time spent with the patient and accompanying care providers(if present) was 55 minutes, with more than 50% of the time spent counseling, educating, and coordinating care of the patient.      Ronny Bacon M.D., FACS 12/19/2020, 3:33 PM

## 2020-12-19 NOTE — Patient Instructions (Addendum)
MRI Breast scheduled 12/23/2020 @ 8:45 am at [facility]. Nothing to eat/drink 4 hours prior. Referral has been sent to Plastic surgeon Debbra Riding . Someone from their office will call to schedule an appointment. If you do not hear from their office by the end of the next week please call our office.  Lumpectomy  A lumpectomy, sometimes called a partial mastectomy, is surgery to remove a cancerous tumor or mass (the lump) from a breast. It is a form of breast-conserving or breast-preservation surgery. This means that the cancerous tissue is removed but the breast remains intact. During a lumpectomy, the portion of the breast that contains the tumor is removed. Some normal tissue around the lump may be taken out to make sure that all of the tumor has been removed. Lymph nodes under your arm may also be removed and tested to find out if the cancer has spread. Lymph nodes are part of the body's disease-fighting system (immune system) and are usually the first place where breast cancer spreads. Tell a health care provider about:  Any allergies you have.  All medicines you are taking, including vitamins, herbs, eye drops, creams, and over-the-counter medicines.  Any problems you or family members have had with anesthetic medicines.  Any blood disorders you have.  Any surgeries you have had.  Any medical conditions you have.  Whether you are pregnant or may be pregnant. What are the risks? Generally, this is a safe procedure. However, problems may occur, including:  Bleeding.  Infection.  Allergic reaction to medicines.  Pain, swelling, weakness, or numbness in the arm on the side of your surgery.  Temporary swelling.  Change in the shape of the breast, particularly if a large portion is removed.  Scar tissue that forms at the surgical site and feels hard to the touch.  Blood clots. What happens before the procedure? Staying hydrated Follow instructions from your  health care provider about hydration, which may include:  Up to 2 hours before the procedure - you may continue to drink clear liquids, such as water, clear fruit juice, black coffee, and plain tea.   Eating and drinking restrictions Follow instructions from your health care provider about eating and drinking, which may include:  8 hours before the procedure - stop eating heavy meals or foods, such as meat, fried foods, or fatty foods.  6 hours before the procedure - stop eating light meals or foods, such as toast or cereal.  6 hours before the procedure - stop drinking milk or drinks that contain milk.  2 hours before the procedure - stop drinking clear liquids. Medicines Ask your health care provider about:  Changing or stopping your regular medicines. This is especially important if you are taking diabetes medicines or blood thinners.  Taking medicines such as aspirin and ibuprofen. These medicines can thin your blood. Do not take these medicines unless your health care provider tells you to take them.  Taking over-the-counter medicines, vitamins, herbs, and supplements. General instructions  Prior to surgery, your health care provider may do a procedure to locate and mark the tumor area in your breast (localization). This will help guide your surgeon to where the incision will be made. This may be done with: ? Imaging, such as a mammogram, ultrasound, or MRI. ? Insertion of a small wire, clip, or seed, or an implant that will reflect a radar signal.  You may have screening tests or exams to get baseline measurements of your arm. These can  be compared to measurements done after surgery to monitor for swelling (lymphedema) that can develop after having lymph nodes removed.  Ask your health care provider: ? How your surgery site will be marked. ? What steps will be taken to help prevent infection. These may include:  Washing skin with a germ-killing soap.  Taking antibiotic  medicine.  Plan to have someone take you home from the hospital or clinic.  Plan to have a responsible adult care for you for at least 24 hours after you leave the hospital or clinic. This is important. What happens during the procedure?  An IV will be inserted into one of your veins.  You will be given one or more of the following: ? A medicine to help you relax (sedative). ? A medicine to numb the area (local anesthetic). ? A medicine to make you fall asleep (general anesthetic).  Your health care provider will use a kind of electric scalpel that uses heat to reduce bleeding (electrocautery knife). A curved incision that follows the natural curve of your breast will be made. This type of incision will allow for minimal scarring and better healing.  The tumor will be removed along with some of the tissue around it. This will be sent to the lab for testing. Your health care provider may also remove lymph nodes at this time if needed.  If the tumor is close to the muscles over your chest, some muscle tissue may also be removed.  A small drain tube may be inserted into your breast area or armpit to collect fluid that may build up after surgery. This tube will be connected to a suction bulb on the outside of your body to remove the fluid.  The incision will be closed with stitches (sutures).  A bandage (dressing) may be placed over the incision. The procedure may vary among health care providers and hospitals.   What happens after the procedure?  Your blood pressure, heart rate, breathing rate, and blood oxygen level will be monitored until you leave the hospital or clinic.  You will be given medicine for pain as needed.  Your IV will be removed when you are able to eat and drink by mouth.  You will be encouraged to get up and walk as soon as you can. This is important to improve blood flow and breathing. Ask for help if you feel weak or unsteady.  You may have: ? A drain tube in  place for 2-3 days to prevent a collection of blood (hematoma) from developing in the breast. You will be given instructions about caring for the drain before you go home. ? A pressure bandage applied for 1-2 days to prevent bleeding or swelling. Your pressure bandage may look like a thick piece of fabric or an elastic wrap. Ask your health care provider how to care for your bandage at home.  You may be given a tight sleeve to wear over your arm on the side of your surgery. You should wear this sleeve as told by your health care provider.  Do not drive for 24 hours if you were given a sedative during your procedure. Summary  A lumpectomy, sometimes called a partial mastectomy, is surgery to remove a cancerous tumor or mass (the lump) from a breast.  During a lumpectomy, the portion of the breast that contains the tumor is removed. Lymph nodes under your arm may also be removed and tested to find out if the cancer has spread.  Plan to  have someone take you home from the hospital or clinic.  You may have a drain tube in place for 2-3 days to prevent a collection of blood (hematoma) from developing in the breast. You will be given instructions about caring for the drain before you go home. This information is not intended to replace advice given to you by your health care provider. Make sure you discuss any questions you have with your health care provider. Document Revised: 05/01/2019 Document Reviewed: 05/01/2019 Elsevier Patient Education  [place].

## 2020-12-23 ENCOUNTER — Ambulatory Visit
Admission: RE | Admit: 2020-12-23 | Discharge: 2020-12-23 | Disposition: A | Discharge status: Home / Self Care | Payer: No Typology Code available for payment source | Source: Ambulatory Visit | Attending: Surgery | Admitting: Surgery

## 2020-12-23 ENCOUNTER — Institutional Professional Consult (permissible substitution): Payer: No Typology Code available for payment source | Admitting: Plastic Surgery

## 2020-12-23 ENCOUNTER — Other Ambulatory Visit: Payer: Self-pay

## 2020-12-23 DIAGNOSIS — C50912 Malignant neoplasm of unspecified site of left female breast: Secondary | ICD-10-CM | POA: Diagnosis not present

## 2020-12-23 DIAGNOSIS — C50512 Malignant neoplasm of lower-outer quadrant of left female breast: Secondary | ICD-10-CM | POA: Diagnosis not present

## 2020-12-23 LAB — SURGICAL PATHOLOGY

## 2020-12-23 MED ORDER — GADOBUTROL 1 MMOL/ML IV SOLN
8.0000 mL | Freq: Once | INTRAVENOUS | Status: AC | PRN
  Administered 2020-12-23: 8 mL via INTRAVENOUS

## 2020-12-24 ENCOUNTER — Encounter: Payer: Self-pay | Admitting: Surgery

## 2020-12-24 ENCOUNTER — Ambulatory Visit (INDEPENDENT_AMBULATORY_CARE_PROVIDER_SITE_OTHER): Payer: No Typology Code available for payment source | Admitting: Surgery

## 2020-12-24 ENCOUNTER — Telehealth: Payer: Self-pay | Admitting: Internal Medicine

## 2020-12-24 VITALS — BP 115/75 | HR 83 | Ht 68.0 in | Wt 192.0 lb

## 2020-12-24 DIAGNOSIS — Z170 Estrogen receptor positive status [ER+]: Secondary | ICD-10-CM

## 2020-12-24 DIAGNOSIS — C50512 Malignant neoplasm of lower-outer quadrant of left female breast: Secondary | ICD-10-CM

## 2020-12-24 NOTE — Telephone Encounter (Signed)
On 2/15-I called patient and left a voicemail to review the results of the breast receptor profile-ER positive/PR positive HER-2/neu positive.  Also discussed with Dr. Christian Mate.  Plan upfront surgery as invasive component appears less than 2 cm.  Patient will need Mediport placement for adjuvant chemotherapy.  Patient currently awaiting discussion with plastic surgery.   FYI-Sheena/Anne.

## 2020-12-24 NOTE — Progress Notes (Signed)
Extensive discussion.   MRI yesterday appears to reveal that the extent of nonmass enhancement likely the extent of DCIS may extend a total of 5 cm, however this is not the size of the invasive mass that was biopsied. Fortunately it is all localized to the 330 region of the left breast. This seems to be keeping the potential for breast conservation viable.    Prognostic indicator showed that this is triple positive, with 3+ HER-2/neu positivity. However the actual invasive mass present is relatively small on ultrasound less than 2 cm.  In discussion with Dr Rogue Bussing, that Herceptin would be best utilized in the adjuvant setting, so no neoadjuvant planned.   I believe we still have options, though it is more confident that surgical/local control efficiency will be completed with mastectomy I do not feel I can limit her choice.  It appears her visit with plastic surgery is been deferred to the 18th. Somewhat limited with my experience with oncoplastic options, I am deferring to Dr. Eusebio Friendly expertise to determine if oncoplastic reconstruction is feasible considering the area involved with DCIS.    Again, I am uncomfortable with proceeding with a more aggressive approach I.e.mastectomy and sentinel lymph node biopsy however, I am reluctant to limit her options without another opinion.  This discussion was at least 40 minutes with all of it focused on treatment options.

## 2020-12-24 NOTE — Patient Instructions (Addendum)
Dr Marla Roe will see you today and speak with you about what can be done surgically. We will speak further with Dr Tish Men about what to do first as far as surgery verses chemotherapy. We will call you about the next steps after this discussion.

## 2020-12-25 DIAGNOSIS — Z170 Estrogen receptor positive status [ER+]: Secondary | ICD-10-CM | POA: Insufficient documentation

## 2020-12-25 DIAGNOSIS — C50512 Malignant neoplasm of lower-outer quadrant of left female breast: Secondary | ICD-10-CM | POA: Insufficient documentation

## 2020-12-26 ENCOUNTER — Telehealth: Payer: Self-pay | Admitting: Internal Medicine

## 2020-12-26 DIAGNOSIS — J301 Allergic rhinitis due to pollen: Secondary | ICD-10-CM | POA: Diagnosis not present

## 2020-12-26 NOTE — Telephone Encounter (Signed)
On 2/16- I called patient and had a very long discussion with the patient and husband regarding the results of the breast cancer profile/and its implication.  Discussed that patient will need adjuvant chemotherapy.  Discussed chemotherapy options/schedules/d duration.  Recommend port placement.  With regards to surgery discussed the pros and cons of lumpectomy versus mastectomy.  However for the final recommendations for surgical option to shared decision with her surgeons [Dr.Rodenberg; Dr.[name]].  Patient asked to inform us regarding the timing of the surgery-we'll plan a follow-up visit in 2 weeks post surgery to review the adjuvant therapy plan. GB  FYI-Dr.Rodenberg;Dr.[name].

## 2020-12-27 ENCOUNTER — Encounter: Payer: Self-pay | Admitting: Plastic Surgery

## 2020-12-27 ENCOUNTER — Ambulatory Visit (INDEPENDENT_AMBULATORY_CARE_PROVIDER_SITE_OTHER): Payer: No Typology Code available for payment source | Admitting: Plastic Surgery

## 2020-12-27 ENCOUNTER — Other Ambulatory Visit: Payer: Self-pay

## 2020-12-27 VITALS — BP 127/81 | HR 82 | Temp 97.1°F | Ht 68.0 in | Wt 190.0 lb

## 2020-12-27 DIAGNOSIS — D4862 Neoplasm of uncertain behavior of left breast: Secondary | ICD-10-CM | POA: Diagnosis not present

## 2020-12-27 DIAGNOSIS — Z170 Estrogen receptor positive status [ER+]: Secondary | ICD-10-CM | POA: Diagnosis not present

## 2020-12-27 DIAGNOSIS — C50512 Malignant neoplasm of lower-outer quadrant of left female breast: Secondary | ICD-10-CM

## 2020-12-27 NOTE — Progress Notes (Addendum)
Patient ID: Judy Padilla, female    DOB: March 05, 1966, 55 y.o.   MRN: [id number]   Chief Complaint  Patient presents with  . Advice Only  . Breast Cancer    The patient is a 55 year old female here for evaluation and consultation for breast reconstruction.  She was recently screened with a mammogram and found to have irregularities.  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  The lesion is in the 3 o'clock position of the left lateral breast.  She is 5 feet 8 inches tall and weighs 190 pounds.  Her preoperative bra size is a 38B.  She would like to be around the same size.  She has a history of asthma and is not a smoker.  Currently a sister is going through breast cancer as well.  She is seeing Dr. Christian Mate in [place].   Review of Systems  Constitutional: Negative.  Negative for activity change and appetite change.  HENT: Negative.   Eyes: Negative.   Respiratory: Negative.  Negative for chest tightness and shortness of breath.   Cardiovascular: Negative for leg swelling.  Gastrointestinal: Negative for abdominal distention and abdominal pain.  Endocrine: Negative.   Genitourinary: Negative.   Musculoskeletal: Negative.   Neurological: Negative.   Hematological: Negative.     Past Medical History:  Diagnosis Date  . Asthma   . Breast cancer ([place])   . Hypertension     Past Surgical History:  Procedure Laterality Date  . ABLATION    . BREAST BIOPSY Left 2011   Benign per pt  . BREAST BIOPSY Left 12/12/2020   3:30 3 cmfn, Q marker, path pending  . BREAST BIOPSY Left 12/12/2020   3:30 1 cmfn, Vision marker, path pending  . COLONOSCOPY  01/15/2020      Current Outpatient Medications:  .  albuterol (VENTOLIN HFA) 108 (90 Base) MCG/ACT inhaler, Inhale 1 puff into the lungs as needed for shortness of breath., Disp: , Rfl:  .  amLODipine (NORVASC) 10 MG tablet, Take 1 tablet by mouth daily., Disp: , Rfl:   .  atorvastatin (LIPITOR) 20 MG tablet, Take 1 tablet by mouth daily., Disp: , Rfl:  .  budesonide (PULMICORT) 0.5 MG/2ML nebulizer solution, Take 2 mLs by nebulization as needed for shortness of breath., Disp: , Rfl:  .  cetirizine (ZYRTEC) 10 MG tablet, Take 1 tablet by mouth daily., Disp: , Rfl:  .  Cholecalciferol 1.25 MG (50000 UT) capsule, Take 1 capsule by mouth once a week., Disp: , Rfl:  .  EPINEPHrine 0.3 mg/0.3 mL IJ SOAJ injection, epinephrine 0.3 mg/0.3 mL injection, auto-injector, Disp: , Rfl:  .  fluticasone (FLONASE) 50 MCG/ACT nasal spray, Place 1 spray into both nostrils daily., Disp: , Rfl:  .  fluticasone furoate-vilanterol (BREO ELLIPTA) 100-25 MCG/INH AEPB, Inhale 1 puff into the lungs as needed for shortness of breath., Disp: , Rfl:  .  naproxen sodium (ALEVE) 220 MG tablet, Take 1 tablet by mouth in the morning and at bedtime., Disp: , Rfl:  .  NON FORMULARY, Takes weekly allergy shots, Disp: , Rfl:  .  pantoprazole (PROTONIX) 40 MG tablet, Take 1 tablet by mouth daily., Disp: , Rfl:    Objective:   Vitals:   12/27/20 0845  BP: 127/81  Pulse: 82  Temp: (!) 97.1 F (36.2 C)  SpO2: 100%    Physical Exam Vitals and nursing note reviewed.  Constitutional:  Appearance: Normal appearance.  HENT:     Head: Normocephalic and atraumatic.  Cardiovascular:     Rate and Rhythm: Normal rate.     Pulses: Normal pulses.  Pulmonary:     Effort: Pulmonary effort is normal. No respiratory distress.  Abdominal:     General: Abdomen is flat. There is no distension.     Tenderness: There is no abdominal tenderness.  Skin:    General: Skin is warm.     Coloration: Skin is not jaundiced.     Findings: No bruising or lesion.  Neurological:     General: No focal deficit present.     Mental Status: She is alert and oriented to person, place, and time.  Psychiatric:        Mood and Affect: Mood normal.        Behavior: Behavior normal.        Thought Content: Thought  content normal.     Assessment & Plan:  Malignant neoplasm of lower-outer quadrant of left breast of female, estrogen receptor positive ([place])  Neoplasm of uncertain behavior of lower outer quadrant of female breast, left  We had a detailed conversation about the patient's options for breast reconstruction. Several reconstruction options were explained to the patient.  It is important to remember that breast reconstruction is an optional procedure. Reconstruction often requires several stages of surgery and this means more than one operation.  The surgeries are often done several months apart.  The entire process from start to finish can take a year or more. The major goal of breast reconstruction is to look normal in clothing. There will always be scars and a difference noticeable without clothes.  This is true for asymmetries where both breasts will not be identical.  Surgery may be needed or desired to the non-cancerous breast in order to achieve better symmetry and satisfactory results.  Regardless of the reconstructive method, there is always risks and the possibility that the procedure will fail or have complications.  This could required additional surgeries.    We discussed the available methods of breast reconstruction and included:  1. Tissue expander with Acellular dermal matrix followed by implant based reconstruction. This can be done as one surgery or multiple surgeries.  2. Autologous reconstruction can include using a muscle or tissue from another area of the body for the reconstruction.  3. Combined procedures like the latissismus dorsi flaps that often uses the muscle with an expander or implant.  For each of the method discussed the risks, benefits, scars and recovery time were discussed in detail. Specific risks included bleeding, infection, hematoma, seroma, scarring, pain, wound healing complications, flap loss, fat necrosis, capsular contracture, need for implant removal, donor  site complications, bulge, hernia, umbilical necrosis, need for urgent reoperation, and need for dressing changes.   After the options were discussed we focused on the patient's desires and the procedure that was best for her based on all the information.  A total of 45 minutes of face-to-face time was spent in this encounter, of which >6 she was in get in for 0% was spent in counseling.    With her family history and current size of her breast she is leaning towards reconstruction with expander and Flex HD placement.  We did discuss all the other options as listed above.  I also confirmed all the information is correct with her and Dr. Christian Mate and I spoke with Dr. Christian Mate.  I evaluated the pathology as well.  We will  move towards immediate breast reconstruction of the left breast with expander and Flex HD.  The patient is aware this is a staged procedure.  Pictures were obtained of the patient and placed in the chart with the patient's or guardian's permission.   [place], DO

## 2020-12-30 ENCOUNTER — Other Ambulatory Visit: Payer: Self-pay | Admitting: Surgery

## 2020-12-30 DIAGNOSIS — Z170 Estrogen receptor positive status [ER+]: Secondary | ICD-10-CM

## 2020-12-30 DIAGNOSIS — C50512 Malignant neoplasm of lower-outer quadrant of left female breast: Secondary | ICD-10-CM

## 2020-12-31 ENCOUNTER — Telehealth: Payer: Self-pay | Admitting: Surgery

## 2020-12-31 DIAGNOSIS — R0981 Nasal congestion: Secondary | ICD-10-CM | POA: Diagnosis not present

## 2020-12-31 DIAGNOSIS — J309 Allergic rhinitis, unspecified: Secondary | ICD-10-CM | POA: Diagnosis not present

## 2020-12-31 NOTE — Telephone Encounter (Signed)
Patient has been advised of Pre-Admission date/time, COVID Testing date and Surgery date.  Surgery Date: 02/10/21 Preadmission Testing Date: 02/03/21 (phone 8a-1p) Covid Testing Date: 02/06/21 - patient advised to go to the [facility] ([place] between 8a-1p  Patient has been made aware to arrive at 7:45 am day for surgery on 02/10/21 as will be having SLN bx prior to surgery.  Patient voices understanding with the above.

## 2021-01-03 ENCOUNTER — Other Ambulatory Visit: Payer: Self-pay

## 2021-01-03 ENCOUNTER — Telehealth (INDEPENDENT_AMBULATORY_CARE_PROVIDER_SITE_OTHER): Payer: No Typology Code available for payment source | Admitting: Plastic Surgery

## 2021-01-03 ENCOUNTER — Encounter: Payer: Self-pay | Admitting: Plastic Surgery

## 2021-01-03 DIAGNOSIS — Z170 Estrogen receptor positive status [ER+]: Secondary | ICD-10-CM

## 2021-01-03 DIAGNOSIS — C50512 Malignant neoplasm of lower-outer quadrant of left female breast: Secondary | ICD-10-CM

## 2021-01-03 NOTE — Progress Notes (Signed)
   Subjective:    Patient ID: Judy Padilla, female    DOB: 1966/05/17, 55 y.o.   MRN: [id number]  The patient is a 55 yrs old bf joining me by phone.  She was diagnosed with left breast invasive mammary carcinoma with DCIS.  She has decided on a mastectomy of the left breast.  She is 5 feet 8 inches tall weighs 190 pounds.  Her preoperative bra size is a 38B.  She would still like to be around the same size.  She works for [employer].  She is able to work at home on the computer.  She has a history of asthma and a sister going through breast cancer as well.  She is not a smoker.ncer as well.    Review of Systems  Constitutional: Negative.   Eyes: Negative.   Respiratory: Negative.   Cardiovascular: Negative.   Gastrointestinal: Negative.   Endocrine: Negative.   Genitourinary: Negative.   Musculoskeletal: Negative.        Objective:   Physical Exam - virtual      Assessment & Plan:     ICD-10-CM   1. Malignant neoplasm of lower-outer quadrant of left breast of female, estrogen receptor positive ([place])  C50.512    Z17.0     The patient gave consent to have this visit done by telemedicine / virtual visit.  This is also consent for access the chart and treat the patient via this visit. The patient is located at home.  I, the provider, am at the office.  We spent 10 minutes together for the visit.      Plan for left breast mastectomy with immediate breast reconstruction with Expander and Flex HD placement with Dr. Christian Mate.

## 2021-01-09 DIAGNOSIS — J301 Allergic rhinitis due to pollen: Secondary | ICD-10-CM | POA: Diagnosis not present

## 2021-01-14 ENCOUNTER — Other Ambulatory Visit: Payer: Self-pay | Admitting: Surgery

## 2021-01-14 DIAGNOSIS — C50512 Malignant neoplasm of lower-outer quadrant of left female breast: Secondary | ICD-10-CM

## 2021-01-14 DIAGNOSIS — Z170 Estrogen receptor positive status [ER+]: Secondary | ICD-10-CM

## 2021-01-23 DIAGNOSIS — J301 Allergic rhinitis due to pollen: Secondary | ICD-10-CM | POA: Diagnosis not present

## 2021-01-28 ENCOUNTER — Other Ambulatory Visit: Payer: Self-pay

## 2021-01-28 ENCOUNTER — Encounter: Payer: Self-pay | Admitting: Surgical

## 2021-01-28 ENCOUNTER — Ambulatory Visit (INDEPENDENT_AMBULATORY_CARE_PROVIDER_SITE_OTHER): Payer: No Typology Code available for payment source | Admitting: Surgical

## 2021-01-28 VITALS — BP 130/85 | HR 69 | Ht 68.0 in | Wt 198.8 lb

## 2021-01-28 DIAGNOSIS — D4862 Neoplasm of uncertain behavior of left breast: Secondary | ICD-10-CM

## 2021-01-28 DIAGNOSIS — C50512 Malignant neoplasm of lower-outer quadrant of left female breast: Secondary | ICD-10-CM

## 2021-01-28 DIAGNOSIS — Z170 Estrogen receptor positive status [ER+]: Secondary | ICD-10-CM

## 2021-01-28 MED ORDER — CEPHALEXIN 500 MG PO CAPS: 500.0000 mg | ORAL_CAPSULE | Freq: Four times a day (QID) | ORAL | 0 refills | Status: AC

## 2021-01-28 MED ORDER — ONDANSETRON HCL 4 MG PO TABS: 4.0000 mg | ORAL_TABLET | Freq: Three times a day (TID) | ORAL | 0 refills | Status: DC | PRN

## 2021-01-28 MED ORDER — HYDROCODONE-ACETAMINOPHEN 7.5-325 MG PO TABS: 1.0000 | ORAL_TABLET | Freq: Four times a day (QID) | ORAL | 0 refills | Status: AC | PRN

## 2021-01-28 MED ORDER — DIAZEPAM 2 MG PO TABS: 2.0000 mg | ORAL_TABLET | Freq: Two times a day (BID) | ORAL | 0 refills | Status: DC | PRN

## 2021-01-28 NOTE — Progress Notes (Signed)
Patient ID: Judy Padilla, female    DOB: June 02, 1966, 55 y.o.   MRN: [id number]  Chief Complaint  Patient presents with  . Pre-op Exam      ICD-10-CM   1. Malignant neoplasm of lower-outer quadrant of left breast of female, estrogen receptor positive ([place])  C50.512    Z17.0   2. Neoplasm of uncertain behavior of lower outer quadrant of female breast, left  D48.62     History of Present Illness: Judy Padilla is a 55 y.o.  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  She presents for preoperative evaluation for upcoming procedure, immediate left breast reconstruction placement of tissue expander and Flex HD, scheduled for 02/10/2021 with Dr. Marla Roe. Patient is scheduled for left simple mastectomy with axillary sentinel lymph node biopsy by Dr. Christian Mate with general surgery followed by the immediate breast reconstruction.  The patient has not had problems with anesthesia. No history of DVT/PE.  No family history of DVT/PE.  No family or personal history of bleeding or clotting disorders.  Patient is not currently taking any blood thinners.  No history of CVA/MI.   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  She has decided on a mastectomy of the left breast.  Her preop bra size is 30 8B.  She would like to be about the same size.  She is not a smoker.  Job: Works from home for [employer] for: Asthma, hypertension  Patient reports she is overall feeling well, she has some questions about the planned procedure.  She reports she is going to have chemotherapy postoperatively. She reports she is not going to need radiation due to having a mastectomy versus lumpectomy.  Past Medical History: Allergies: Allergies  Allergen Reactions  . Shrimp Extract Allergy Skin Test Shortness Of Breath  . Other Swelling    Cannot take Excedrin     Current Medications:  Current  Outpatient Medications:  .  albuterol (VENTOLIN HFA) 108 (90 Base) MCG/ACT inhaler, Inhale 1 puff into the lungs every 6 (six) hours as needed for shortness of breath., Disp: , Rfl:  .  amLODipine (NORVASC) 10 MG tablet, Take 10 mg by mouth daily., Disp: , Rfl:  .  atorvastatin (LIPITOR) 80 MG tablet, Take 80 mg by mouth daily., Disp: , Rfl:  .  budesonide (PULMICORT) 0.5 MG/2ML nebulizer solution, Take 2 mLs by nebulization daily., Disp: , Rfl:  .  cetirizine (ZYRTEC) 10 MG tablet, Take 10 mg by mouth daily as needed for allergies., Disp: , Rfl:  .  Cholecalciferol 1.25 MG (50000 UT) capsule, Take 50,000 Units by mouth once a week., Disp: , Rfl:  .  fluticasone (FLONASE) 50 MCG/ACT nasal spray, Place 1 spray into both nostrils daily as needed for allergies., Disp: , Rfl:  .  fluticasone furoate-vilanterol (BREO ELLIPTA) 100-25 MCG/INH AEPB, Inhale 1 puff into the lungs as needed for shortness of breath., Disp: , Rfl:  .  Multiple Vitamins-Minerals (CENTRUM ADULTS) TABS, Take 1 tablet by mouth daily., Disp: , Rfl:  .  naproxen sodium (ALEVE) 220 MG tablet, Take 220 mg by mouth daily as needed (knee pain)., Disp: , Rfl:  .  NON FORMULARY, Takes weekly allergy shots, Disp: , Rfl:  .  pantoprazole (PROTONIX) 40 MG tablet, Take 40 mg by mouth daily., Disp: , Rfl:  .  EPINEPHrine 0.3 mg/0.3 mL IJ SOAJ injection, Inject 0.3 mg  into the muscle as needed for anaphylaxis. (Patient not taking: Reported on 01/28/2021), Disp: , Rfl:   Past Medical Problems: Past Medical History:  Diagnosis Date  . Asthma   . Breast cancer ([place])   . Hypertension     Past Surgical History: Past Surgical History:  Procedure Laterality Date  . ABLATION    . BREAST BIOPSY Left 2011   Benign per pt  . BREAST BIOPSY Left 12/12/2020   3:30 3 cmfn, Q marker, path pending  . BREAST BIOPSY Left 12/12/2020   3:30 1 cmfn, Vision marker, path pending  . COLONOSCOPY  01/15/2020    Social History: Social History    Socioeconomic History  . Marital status: Married    Spouse name: Not on file  . Number of children: Not on file  . Years of education: Not on file  . Highest education level: Not on file  Occupational History  . Not on file  Tobacco Use  . Smoking status: Never Smoker  . Smokeless tobacco: Never Used  Substance and Sexual Activity  . Alcohol use: Not Currently  . Drug use: Never  . Sexual activity: Not on file  Other Topics Concern  . Not on file  Social History Narrative   Lives in [place] with husband; kids- college. Works for [employer]- working from home. No smoking or alcohol.    Social Determinants of Health   Financial Resource Strain: Not on file  Food Insecurity: Not on file  Transportation Needs: Not on file  Physical Activity: Not on file  Stress: Not on file  Social Connections: Not on file  Intimate Partner Violence: Not on file    Family History: Family History  Problem Relation Age of Onset  . Hypertension Mother   . Diabetes Mother   . Hypertension Father   . Diabetes Father   . Cancer Father        prostate cancer-70s  . Cancer Maternal Grandmother        uterine cancer  . Breast cancer Sister        in in 43s.     Review of Systems: Review of Systems  Constitutional: Negative.   Respiratory: Negative.   Cardiovascular: Negative.   Gastrointestinal: Negative.   Neurological: Negative.     Physical Exam: Vital Signs BP 130/85 (BP Location: Left Arm, Patient Position: Sitting, Cuff Size: Normal)   Pulse 69   Ht 5\' 8"  (1.727 m)   Wt 198 lb 12.8 oz (90.2 kg)   LMP 01/04/2018   SpO2 98%   BMI 30.23 kg/m   Physical Exam Constitutional:      General: Not in acute distress.    Appearance: Normal appearance. Not ill-appearing.  HENT:     Head: Normocephalic and atraumatic.  Eyes:     Pupils: Pupils are equal, round Neck:     Musculoskeletal: Normal range of motion.  Cardiovascular:     Rate and Rhythm: Normal  rate    Pulses: Normal pulses. Pulmonary:     Effort: Pulmonary effort is normal. No respiratory distress.  Abdominal:     General: Abdomen is flat. There is no distension.     Palpations: Abdomen is soft.     Tenderness: There is no abdominal tenderness.  Musculoskeletal: Normal range of motion.  Skin:    General: Skin is warm and dry.     Findings: No erythema or rash.  Neurological:     General: No focal deficit present.     Mental  Status: Alert and oriented to person, place, and time. Mental status is at baseline.     Motor: No weakness.  Psychiatric:        Mood and Affect: Mood normal.        Behavior: Behavior normal.    Assessment/Plan: The patient is scheduled for immediate left breast reconstruction with placement of tissue expander and Flex HD with Dr. Marla Roe after left breast mastectomy by general surgery.  Risks, benefits, and alternatives of procedure discussed, questions answered and consent obtained.    Smoking Status: Non-smoker; Counseling Given?  N/A Last Mammogram: Screening mammogram of bilateral breasts on 11/19/2020 followed by diagnostic left breast mammogram and left breast clip placement.; Results: Invasive mammary carcinoma of left breast  Caprini Score: 6, high; Risk Factors include: Age, BMI greater than 25, current breast cancer and length of planned surgery. Recommendation for mechanical and pharmacological prophylaxis for 7 to 10 days postoperatively. Encourage early ambulation.  Given the patient's overall health status and her functionality, will hold off on postoperative pharmacological prophylaxis and monitor.  Pictures obtained: 12/27/2020  Post-op Rx sent to pharmacy: Norco, Zofran, Keflex, Valium  Patient was provided with the breast reconstruction and General Surgical Risk consent document and Pain Medication Agreement prior to their appointment.  They had adequate time to read through the risk consent documents and Pain Medication Agreement.  We also discussed them in person together during this preop appointment. All of their questions were answered to their satisfaction.  Recommended calling if they have any further questions.  Risk consent form and Pain Medication Agreement to be scanned into [facility] mammogram of bilateral breasts chart.  The risks that can be encountered with and after placement of a breast expander placement were discussed and include the following but not limited to these: bleeding, infection, delayed healing, anesthesia risks, skin sensation changes, injury to structures including nerves, blood vessels, and muscles which may be temporary or permanent, allergies to tape, suture materials and glues, blood products, topical preparations or injected agents, skin contour irregularities, skin discoloration and swelling, deep vein thrombosis, cardiac and pulmonary complications, pain, which may persist, fluid accumulation, wrinkling of the skin over the expander, changes in nipple or breast sensation, expander leakage or rupture, faulty position of the expander, persistent pain, formation of tight scar tissue around the expander (capsular contracture), possible need for revisional surgery or staged procedures.   Electronically signed by: Carola Rhine Alexcia Schools, PA-C 01/28/2021 1:45 PM

## 2021-01-28 NOTE — H&P (View-Only) (Signed)
Patient ID: Judy Padilla, female    DOB: Aug 30, 1966, 55 y.o.   MRN: [id number]  Chief Complaint  Patient presents with  . Pre-op Exam      ICD-10-CM   1. Malignant neoplasm of lower-outer quadrant of left breast of female, estrogen receptor positive ([place])  C50.512    Z17.0   2. Neoplasm of uncertain behavior of lower outer quadrant of female breast, left  D48.62     History of Present Illness: Judy Padilla is a 55 y.o.  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  She presents for preoperative evaluation for upcoming procedure, immediate left breast reconstruction placement of tissue expander and Flex HD, scheduled for 02/10/2021 with Dr. Marla Roe. Patient is scheduled for left simple mastectomy with axillary sentinel lymph node biopsy by Dr. Christian Mate with general surgery followed by the immediate breast reconstruction.  The patient has not had problems with anesthesia. No history of DVT/PE.  No family history of DVT/PE.  No family or personal history of bleeding or clotting disorders.  Patient is not currently taking any blood thinners.  No history of CVA/MI.   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  She has decided on a mastectomy of the left breast.  Her preop bra size is 30 8B.  She would like to be about the same size.  She is not a smoker.  Job: Works from home for [employer] for: Asthma, hypertension  Patient reports she is overall feeling well, she has some questions about the planned procedure.  She reports she is going to have chemotherapy postoperatively. She reports she is not going to need radiation due to having a mastectomy versus lumpectomy.  Past Medical History: Allergies: Allergies  Allergen Reactions  . Shrimp Extract Allergy Skin Test Shortness Of Breath  . Other Swelling    Cannot take Excedrin     Current Medications:  Current  Outpatient Medications:  .  albuterol (VENTOLIN HFA) 108 (90 Base) MCG/ACT inhaler, Inhale 1 puff into the lungs every 6 (six) hours as needed for shortness of breath., Disp: , Rfl:  .  amLODipine (NORVASC) 10 MG tablet, Take 10 mg by mouth daily., Disp: , Rfl:  .  atorvastatin (LIPITOR) 80 MG tablet, Take 80 mg by mouth daily., Disp: , Rfl:  .  budesonide (PULMICORT) 0.5 MG/2ML nebulizer solution, Take 2 mLs by nebulization daily., Disp: , Rfl:  .  cetirizine (ZYRTEC) 10 MG tablet, Take 10 mg by mouth daily as needed for allergies., Disp: , Rfl:  .  Cholecalciferol 1.25 MG (50000 UT) capsule, Take 50,000 Units by mouth once a week., Disp: , Rfl:  .  fluticasone (FLONASE) 50 MCG/ACT nasal spray, Place 1 spray into both nostrils daily as needed for allergies., Disp: , Rfl:  .  fluticasone furoate-vilanterol (BREO ELLIPTA) 100-25 MCG/INH AEPB, Inhale 1 puff into the lungs as needed for shortness of breath., Disp: , Rfl:  .  Multiple Vitamins-Minerals (CENTRUM ADULTS) TABS, Take 1 tablet by mouth daily., Disp: , Rfl:  .  naproxen sodium (ALEVE) 220 MG tablet, Take 220 mg by mouth daily as needed (knee pain)., Disp: , Rfl:  .  NON FORMULARY, Takes weekly allergy shots, Disp: , Rfl:  .  pantoprazole (PROTONIX) 40 MG tablet, Take 40 mg by mouth daily., Disp: , Rfl:  .  EPINEPHrine 0.3 mg/0.3 mL IJ SOAJ injection, Inject 0.3 mg  into the muscle as needed for anaphylaxis. (Patient not taking: Reported on 01/28/2021), Disp: , Rfl:   Past Medical Problems: Past Medical History:  Diagnosis Date  . Asthma   . Breast cancer ([place])   . Hypertension     Past Surgical History: Past Surgical History:  Procedure Laterality Date  . ABLATION    . BREAST BIOPSY Left 2011   Benign per pt  . BREAST BIOPSY Left 12/12/2020   3:30 3 cmfn, Q marker, path pending  . BREAST BIOPSY Left 12/12/2020   3:30 1 cmfn, Vision marker, path pending  . COLONOSCOPY  01/15/2020    Social History: Social History    Socioeconomic History  . Marital status: Married    Spouse name: Not on file  . Number of children: Not on file  . Years of education: Not on file  . Highest education level: Not on file  Occupational History  . Not on file  Tobacco Use  . Smoking status: Never Smoker  . Smokeless tobacco: Never Used  Substance and Sexual Activity  . Alcohol use: Not Currently  . Drug use: Never  . Sexual activity: Not on file  Other Topics Concern  . Not on file  Social History Narrative   Lives in [place] with husband; kids- college. Works for [employer]- working from home. No smoking or alcohol.    Social Determinants of Health   Financial Resource Strain: Not on file  Food Insecurity: Not on file  Transportation Needs: Not on file  Physical Activity: Not on file  Stress: Not on file  Social Connections: Not on file  Intimate Partner Violence: Not on file    Family History: Family History  Problem Relation Age of Onset  . Hypertension Mother   . Diabetes Mother   . Hypertension Father   . Diabetes Father   . Cancer Father        prostate cancer-70s  . Cancer Maternal Grandmother        uterine cancer  . Breast cancer Sister        in in 33s.     Review of Systems: Review of Systems  Constitutional: Negative.   Respiratory: Negative.   Cardiovascular: Negative.   Gastrointestinal: Negative.   Neurological: Negative.     Physical Exam: Vital Signs BP 130/85 (BP Location: Left Arm, Patient Position: Sitting, Cuff Size: Normal)   Pulse 69   Ht 5\' 8"  (1.727 m)   Wt 198 lb 12.8 oz (90.2 kg)   LMP 01/04/2018   SpO2 98%   BMI 30.23 kg/m   Physical Exam Constitutional:      General: Not in acute distress.    Appearance: Normal appearance. Not ill-appearing.  HENT:     Head: Normocephalic and atraumatic.  Eyes:     Pupils: Pupils are equal, round Neck:     Musculoskeletal: Normal range of motion.  Cardiovascular:     Rate and Rhythm: Normal  rate    Pulses: Normal pulses. Pulmonary:     Effort: Pulmonary effort is normal. No respiratory distress.  Abdominal:     General: Abdomen is flat. There is no distension.     Palpations: Abdomen is soft.     Tenderness: There is no abdominal tenderness.  Musculoskeletal: Normal range of motion.  Skin:    General: Skin is warm and dry.     Findings: No erythema or rash.  Neurological:     General: No focal deficit present.     Mental  Status: Alert and oriented to person, place, and time. Mental status is at baseline.     Motor: No weakness.  Psychiatric:        Mood and Affect: Mood normal.        Behavior: Behavior normal.    Assessment/Plan: The patient is scheduled for immediate left breast reconstruction with placement of tissue expander and Flex HD with Dr. Marla Roe after left breast mastectomy by general surgery.  Risks, benefits, and alternatives of procedure discussed, questions answered and consent obtained.    Smoking Status: Non-smoker; Counseling Given?  N/A Last Mammogram: Screening mammogram of bilateral breasts on 11/19/2020 followed by diagnostic left breast mammogram and left breast clip placement.; Results: Invasive mammary carcinoma of left breast  Caprini Score: 6, high; Risk Factors include: Age, BMI greater than 25, current breast cancer and length of planned surgery. Recommendation for mechanical and pharmacological prophylaxis for 7 to 10 days postoperatively. Encourage early ambulation.  Given the patient's overall health status and her functionality, will hold off on postoperative pharmacological prophylaxis and monitor.  Pictures obtained: 12/27/2020  Post-op Rx sent to pharmacy: Norco, Zofran, Keflex, Valium  Patient was provided with the breast reconstruction and General Surgical Risk consent document and Pain Medication Agreement prior to their appointment.  They had adequate time to read through the risk consent documents and Pain Medication Agreement.  We also discussed them in person together during this preop appointment. All of their questions were answered to their satisfaction.  Recommended calling if they have any further questions.  Risk consent form and Pain Medication Agreement to be scanned into [facility] mammogram of bilateral breasts chart.  The risks that can be encountered with and after placement of a breast expander placement were discussed and include the following but not limited to these: bleeding, infection, delayed healing, anesthesia risks, skin sensation changes, injury to structures including nerves, blood vessels, and muscles which may be temporary or permanent, allergies to tape, suture materials and glues, blood products, topical preparations or injected agents, skin contour irregularities, skin discoloration and swelling, deep vein thrombosis, cardiac and pulmonary complications, pain, which may persist, fluid accumulation, wrinkling of the skin over the expander, changes in nipple or breast sensation, expander leakage or rupture, faulty position of the expander, persistent pain, formation of tight scar tissue around the expander (capsular contracture), possible need for revisional surgery or staged procedures.   Electronically signed by: Carola Rhine Savalas Monje, PA-C 01/28/2021 1:45 PM

## 2021-01-30 DIAGNOSIS — J301 Allergic rhinitis due to pollen: Secondary | ICD-10-CM | POA: Diagnosis not present

## 2021-02-03 ENCOUNTER — Other Ambulatory Visit: Payer: Self-pay

## 2021-02-03 ENCOUNTER — Ambulatory Visit: Payer: Self-pay | Admitting: Surgery

## 2021-02-03 ENCOUNTER — Encounter
Admission: RE | Admit: 2021-02-03 | Discharge: 2021-02-03 | Disposition: A | Discharge status: Home / Self Care | Payer: No Typology Code available for payment source | Source: Ambulatory Visit | Attending: Surgery | Admitting: Surgery

## 2021-02-03 DIAGNOSIS — Z01818 Encounter for other preprocedural examination: Secondary | ICD-10-CM | POA: Diagnosis not present

## 2021-02-03 DIAGNOSIS — C50512 Malignant neoplasm of lower-outer quadrant of left female breast: Secondary | ICD-10-CM

## 2021-02-03 HISTORY — DX: Family history of other specified conditions: Z84.89

## 2021-02-03 HISTORY — DX: Gastro-esophageal reflux disease without esophagitis: K21.9

## 2021-02-03 NOTE — Patient Instructions (Addendum)
Your procedure is scheduled on:02-10-21 MONDAY Report to the Radiology Desk in the [facility] (2nd desk on right) @ 7:45 am  REMEMBER: Instructions that are not followed completely may result in serious medical risk, up to and including death; or upon the discretion of your surgeon and anesthesiologist your surgery may need to be rescheduled.  Do not eat food after midnight the night before surgery.  No gum chewing, lozengers or hard candies.  You may however, drink CLEAR liquids up to 2 hours before you are scheduled to arrive for your surgery. Do not drink anything within 2 hours of your scheduled arrival time.  Clear liquids include: - water  - apple juice without pulp - gatorade - black coffee or tea (Do NOT add milk or creamers to the coffee or tea) Do NOT drink anything that is not on this list.  TAKE THESE MEDICATIONS THE MORNING OF SURGERY WITH A SIP OF WATER: -NORVASC (AMLODIPINE) -LIPITOR (ATORVASTATIN) -PROTONIX (PANTOPRAZOLE)-take one the night before and one on the morning of surgery - helps to prevent nausea after surgery.)  USE YOUR ALBUTEROL INHALER THE MORNING OF SURGERY AND BRING ALBUTEROL INHALER TO [facility]  One week prior to surgery: Stop Anti-inflammatories (NSAIDS) such as Advil, Aleve, Ibuprofen, Motrin, Naproxen, Naprosyn and Aspirin based products such as Excedrin, Goodys Powder, BC Powder-OK TO TAKE TYLENOL IF NEEDED  Stop ANY OVER THE COUNTER supplements until after surgery-HOWEVER, YOU MAY CONTINUE YOUR VITAMIN D AND MULTIVITAMIN UP UNTIL THE DAY BEFORE YOUR SURGERY  No Alcohol for 24 hours before or after surgery.  No Smoking including e-cigarettes for 24 hours prior to surgery.  No chewable tobacco products for at least 6 hours prior to surgery.  No nicotine patches on the day of surgery.  Do not use any "recreational" drugs for at least a week prior to your surgery.  Please be advised that the combination of cocaine and anesthesia may have  negative outcomes, up to and including death. If you test positive for cocaine, your surgery will be cancelled.  On the morning of surgery brush your teeth with toothpaste and water, you may rinse your mouth with mouthwash if you wish. Do not swallow any toothpaste or mouthwash.  Do not wear jewelry, make-up, hairpins, clips or nail polish.  Do not wear lotions, powders, or perfumes.   Do not shave body from the neck down 48 hours prior to surgery just in case you cut yourself which could leave a site for infection.  Also, freshly shaved skin may become irritated if using the CHG soap.  Contact lenses, hearing aids and dentures may not be worn into surgery.  Do not bring valuables to the hospital. [facility] is not responsible for any missing/lost belongings or valuables.   Use CHG Soap as directed on instruction sheet.  Notify your doctor if there is any change in your medical condition (cold, fever, infection).  Wear comfortable clothing (specific to your surgery type) to the hospital.  Plan for stool softeners for home use; pain medications have a tendency to cause constipation. You can also help prevent constipation by eating foods high in fiber such as fruits and vegetables and drinking plenty of fluids as your diet allows.  After surgery, you can help prevent lung complications by doing breathing exercises.  Take deep breaths and cough every 1-2 hours. Your doctor may order a device called an Incentive Spirometer to help you take deep breaths. When coughing or sneezing, hold a pillow firmly against your  incision with both hands. This is called "splinting." Doing this helps protect your incision. It also decreases belly discomfort.  If you are being admitted to the hospital overnight, leave your suitcase in the car. After surgery it may be brought to your room.  If you are being discharged the day of surgery, you will not be allowed to drive home. You will need a responsible  adult (18 years or older) to drive you home and stay with you that night.   If you are taking public transportation, you will need to have a responsible adult (18 years or older) with you. Please confirm with your physician that it is acceptable to use public transportation.   Please call the [facility] at [phone] if you have any questions about these instructions.  Surgery Visitation Policy:  Patients undergoing a surgery or procedure may have one family member or support person with them as long as that person is not COVID-19 positive or experiencing its symptoms.  That person may remain in the waiting area during the procedure.  Inpatient Visitation:    Visiting hours are 7 a.m. to 8 p.m. Inpatients will be allowed two visitors daily. The visitors may change each day during the patient's stay. No visitors under the age of 1. Any visitor under the age of 72 must be accompanied by an adult. The visitor must pass COVID-19 screenings, use hand sanitizer when entering and exiting the patient's room and wear a mask at all times, including in the patient's room. Patients must also wear a mask when staff or their visitor are in the room. Masking is required regardless of vaccination status.

## 2021-02-04 ENCOUNTER — Encounter
Admission: RE | Admit: 2021-02-04 | Discharge: 2021-02-04 | Disposition: A | Discharge status: Home / Self Care | Payer: No Typology Code available for payment source | Source: Ambulatory Visit | Attending: Surgery | Admitting: Surgery

## 2021-02-04 ENCOUNTER — Other Ambulatory Visit: Payer: Self-pay

## 2021-02-04 DIAGNOSIS — Z01812 Encounter for preprocedural laboratory examination: Secondary | ICD-10-CM | POA: Insufficient documentation

## 2021-02-04 DIAGNOSIS — Z01818 Encounter for other preprocedural examination: Secondary | ICD-10-CM | POA: Diagnosis not present

## 2021-02-04 LAB — COMPREHENSIVE METABOLIC PANEL
ALT: 28 U/L (ref 0–44)
AST: 22 U/L (ref 15–41)
Albumin: 4.3 g/dL (ref 3.5–5.0)
Alkaline Phosphatase: 83 U/L (ref 38–126)
Anion gap: 7 (ref 5–15)
BUN: 22 mg/dL — ABNORMAL HIGH (ref 6–20)
CO2: 24 mmol/L (ref 22–32)
Calcium: 9.4 mg/dL (ref 8.9–10.3)
Chloride: 107 mmol/L (ref 98–111)
Creatinine, Ser: 0.9 mg/dL (ref 0.44–1.00)
GFR, Estimated: >60 mL/min (ref >60)
Glucose, Bld: 99 mg/dL (ref 70–99)
Potassium: 4.1 mmol/L (ref 3.5–5.1)
Sodium: 138 mmol/L (ref 135–145)
Total Bilirubin: 0.9 mg/dL (ref 0.3–1.2)
Total Protein: 7.2 g/dL (ref 6.5–8.1)

## 2021-02-04 LAB — CBC WITH DIFFERENTIAL/PLATELET
Abs Immature Granulocytes: 0.03 10*3/uL (ref 0.00–0.07)
Basophils Absolute: 0 10*3/uL (ref 0.0–0.1)
Basophils Relative: 0 %
Eosinophils Absolute: 0.2 10*3/uL (ref 0.0–0.5)
Eosinophils Relative: 4 %
HCT: 41.2 % (ref 36.0–46.0)
Hemoglobin: 13.4 g/dL (ref 12.0–15.0)
Immature Granulocytes: 1 %
Lymphocytes Relative: 32 %
Lymphs Abs: 1.9 10*3/uL (ref 0.7–4.0)
MCH: 26.8 pg (ref 26.0–34.0)
MCHC: 32.5 g/dL (ref 30.0–36.0)
MCV: 82.4 fL (ref 80.0–100.0)
Monocytes Absolute: 0.5 10*3/uL (ref 0.1–1.0)
Monocytes Relative: 9 %
Neutro Abs: 3.1 10*3/uL (ref 1.7–7.7)
Neutrophils Relative %: 54 %
Platelets: 257 10*3/uL (ref 150–400)
RBC: 5 MIL/uL (ref 3.87–5.11)
RDW: 14.8 % (ref 11.5–15.5)
WBC: 5.8 10*3/uL (ref 4.0–10.5)
nRBC: 0 % (ref 0.0–0.2)

## 2021-02-06 ENCOUNTER — Other Ambulatory Visit: Payer: Self-pay

## 2021-02-06 ENCOUNTER — Ambulatory Visit (INDEPENDENT_AMBULATORY_CARE_PROVIDER_SITE_OTHER): Payer: No Typology Code available for payment source | Admitting: Surgery

## 2021-02-06 ENCOUNTER — Other Ambulatory Visit
Admission: RE | Admit: 2021-02-06 | Discharge: 2021-02-06 | Disposition: A | Discharge status: Home / Self Care | Payer: No Typology Code available for payment source | Source: Ambulatory Visit | Attending: Surgery | Admitting: Surgery

## 2021-02-06 ENCOUNTER — Encounter: Payer: Self-pay | Admitting: Surgery

## 2021-02-06 VITALS — BP 143/87 | HR 89 | Temp 98.4°F | Ht 68.0 in | Wt 192.6 lb

## 2021-02-06 DIAGNOSIS — Z01812 Encounter for preprocedural laboratory examination: Secondary | ICD-10-CM | POA: Diagnosis not present

## 2021-02-06 DIAGNOSIS — Z170 Estrogen receptor positive status [ER+]: Secondary | ICD-10-CM | POA: Diagnosis not present

## 2021-02-06 DIAGNOSIS — Z20822 Contact with and (suspected) exposure to covid-19: Secondary | ICD-10-CM | POA: Insufficient documentation

## 2021-02-06 DIAGNOSIS — C50512 Malignant neoplasm of lower-outer quadrant of left female breast: Secondary | ICD-10-CM

## 2021-02-06 LAB — SARS CORONAVIRUS 2 (TAT 6-24 HRS): SARS Coronavirus 2: NEGATIVE

## 2021-02-06 NOTE — H&P (View-Only) (Signed)
Patient ID: Judy Padilla, female   DOB: 08-May-1966, 55 y.o.   MRN: [id number]  Chief Complaint: Left breast cancer  History of Present Illness She presents today in preparation for her left total mastectomy with SLNBx and reconstruction scheduled for Monday April 4.   She reminded me about the consideration of getting her Port-A-Cath placed in the same OR visit. I reviewed Dr. Aletha Halim note, indicating he did want a MediPort placed. So I believe we can get this accomplished in her same trip to the OR. Previously on initial presentation: Judy Padilla is a 55 y.o. female with recent diagnosis of left breast cancer, multifocal at 330 of the left breast.  At 3 cm from nipple is an invasive carcinoma about 11 mm on the sample, grade 3.  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  It was thought prudent to proceed with MRI to better determine the extent of her DCIS. Her biopsy was on February 3, and her prognostic indicators were not back yet.  She utilized birth control for about 1 year in 2000.  She is postmenopausal.  She has a family history of breast cancer in her sister.  She began menstruating at the age of 70 she has been pregnant once at the age of 89.  She has not felt a mass, noted any discharge from the nipple or any skin changes.  She has had no breast pain and she does not do monthly exams but she does do yearly mammography.   Past Medical History Past Medical History:  Diagnosis Date  . Asthma    well controlled  . Breast cancer ([place])   . Family history of adverse reaction to anesthesia    sister-hard time waking up  . GERD (gastroesophageal reflux disease)   . Hypertension       Past Surgical History:  Procedure Laterality Date  . ABLATION    . BREAST BIOPSY Left 2011   Benign per pt  . BREAST BIOPSY Left 12/12/2020   3:30 3 cmfn, Q marker, path pending  . BREAST BIOPSY Left 12/12/2020   3:30 1 cmfn, Vision marker, path pending  . COLONOSCOPY   01/15/2020    Allergies  Allergen Reactions  . Shrimp Extract Allergy Skin Test Shortness Of Breath  . Other Swelling    Cannot take Excedrin     Current Outpatient Medications  Medication Sig Dispense Refill  . albuterol (VENTOLIN HFA) 108 (90 Base) MCG/ACT inhaler Inhale 1 puff into the lungs every 6 (six) hours as needed for shortness of breath.    Marland Kitchen amLODipine (NORVASC) 10 MG tablet Take 10 mg by mouth every morning.    Marland Kitchen atorvastatin (LIPITOR) 80 MG tablet Take 80 mg by mouth every morning.    . budesonide (PULMICORT) 0.5 MG/2ML nebulizer solution Take 2 mLs by nebulization daily.    . cetirizine (ZYRTEC) 10 MG tablet Take 10 mg by mouth daily as needed for allergies.    . Cholecalciferol 1.25 MG (50000 UT) capsule Take 50,000 Units by mouth once a week.    . diazepam (VALIUM) 2 MG tablet Take 1 tablet (2 mg total) by mouth every 12 (twelve) hours as needed for muscle spasms. (Patient not taking: Reported on 02/03/2021) 20 tablet 0  . EPINEPHrine 0.3 mg/0.3 mL IJ SOAJ injection Inject 0.3 mg into the muscle as needed for anaphylaxis. (Patient not taking: Reported on 01/28/2021)    . fluticasone (FLONASE) 50 MCG/ACT nasal spray Place 1 spray into  both nostrils daily as needed for allergies.    . fluticasone furoate-vilanterol (BREO ELLIPTA) 100-25 MCG/INH AEPB Inhale 1 puff into the lungs as needed for shortness of breath.    . GNP BUDESONIDE NASAL SPRAY NA Place 1 spray into the nose daily.    . Multiple Vitamins-Minerals (CENTRUM ADULTS) TABS Take 1 tablet by mouth daily.    . naproxen sodium (ALEVE) 220 MG tablet Take 220 mg by mouth daily as needed (knee pain).    . NON FORMULARY Takes weekly allergy shots    . ondansetron (ZOFRAN) 4 MG tablet Take 1 tablet (4 mg total) by mouth every 8 (eight) hours as needed for nausea or vomiting. (Patient not taking: Reported on 02/03/2021) 20 tablet 0  . pantoprazole (PROTONIX) 40 MG tablet Take 40 mg by mouth every morning.     No current  facility-administered medications for this visit.    Family History Family History  Problem Relation Age of Onset  . Hypertension Mother   . Diabetes Mother   . Hypertension Father   . Diabetes Father   . Cancer Father        prostate cancer-70s  . Cancer Maternal Grandmother        uterine cancer  . Breast cancer Sister        in in 54s.       Social History Social History   Tobacco Use  . Smoking status: Never Smoker  . Smokeless tobacco: Never Used  Vaping Use  . Vaping Use: Never used  Substance Use Topics  . Alcohol use: Not Currently  . Drug use: Never        Review of Systems  Constitutional: Negative.   HENT: Negative.   Eyes: Negative.   Respiratory: Negative.   Cardiovascular: Negative.   Gastrointestinal: Negative.   Genitourinary: Negative.   Skin: Negative.   Neurological: Negative.   Psychiatric/Behavioral: Negative.       Physical Exam Last menstrual period 01/04/2018.   CONSTITUTIONAL: Well developed, and nourished, appropriately responsive and aware without distress.   EYES: Sclera non-icteric.   EARS, NOSE, MOUTH AND THROAT: Mask worn.   Hearing is intact to voice.  NECK: Trachea is midline, and there is no jugular venous distension.  LYMPH NODES:  Lymph nodes in the neck are not enlarged. RESPIRATORY:  Lungs are clear, and breath sounds are equal bilaterally. Normal respiratory effort without pathologic use of accessory muscles. CARDIOVASCULAR: Heart is regular in rate and rhythm. GI: The abdomen is soft, nontender, and nondistended. There were no palpable masses. I did not appreciate hepatosplenomegaly. There were normal bowel sounds. GU: Her breast exam is unremarkable aside from the postbiopsy changes in the outer lower quadrant of the left breast.  No suspicious nor dominant masses in either breast present as per exam post biopsy. MUSCULOSKELETAL:  Symmetrical muscle tone appreciated in all four extremities.    SKIN: Skin turgor is  normal. No pathologic skin lesions appreciated.  NEUROLOGIC:  Motor and sensation appear grossly normal.  Cranial nerves are grossly without defect. PSYCH:  Alert and oriented to person, place and time. Affect is appropriate for situation.  Data Reviewed I have personally reviewed what is currently available of the patient's imaging, recent labs and medical records.   Labs:  CBC Latest Ref Rng & Units 02/04/2021  WBC 4.0 - 10.5 K/uL 5.8  Hemoglobin 12.0 - 15.0 g/dL 13.4  Hematocrit 36.0 - 46.0 % 41.2  Platelets 150 - 400 K/uL 257   CMP  Latest Ref Rng & Units 02/04/2021  Glucose 70 - 99 mg/dL 99  BUN 6 - 20 mg/dL 22(H)  Creatinine 0.44 - 1.00 mg/dL 0.90  Sodium 135 - 145 mmol/L 138  Potassium 3.5 - 5.1 mmol/L 4.1  Chloride 98 - 111 mmol/L 107  CO2 22 - 32 mmol/L 24  Calcium 8.9 - 10.3 mg/dL 9.4  Total Protein 6.5 - 8.1 g/dL 7.2  Total Bilirubin 0.3 - 1.2 mg/dL 0.9  Alkaline Phos 38 - 126 U/L 83  AST 15 - 41 U/L 22  ALT 0 - 44 U/L 28      Imaging: Radiology review:   CLINICAL DATA:  Screening.  EXAM: DIGITAL SCREENING BILATERAL MAMMOGRAM WITH TOMO AND CAD  COMPARISON:  Previous exam(s).  ACR Breast Density Category c: The breast tissue is heterogeneously dense, which may obscure small masses.  FINDINGS: In the left breast, calcifications in the outer aspect of the breast in the middle depth warrant further evaluation. In the right breast, no findings suspicious for malignancy. Images were processed with CAD.  IMPRESSION: Further evaluation is suggested for calcifications in the left breast.  RECOMMENDATION: Diagnostic mammogram of the left breast. (Code:FI-L-6M)  The patient will be contacted regarding the findings, and additional imaging will be scheduled.  BI-RADS CATEGORY  0: Incomplete. Need additional imaging evaluation and/or prior mammograms for comparison.   Electronically Signed   By: Claudie Revering M.D.   On: 11/19/2020 11:45 CLINICAL  DATA:  55 year old female presenting as a recall from screening for possible left breast calcifications.  EXAM: DIGITAL DIAGNOSTIC LEFT MAMMOGRAM WITH TOMOSYNTHESIS  TECHNIQUE: Left digital diagnostic mammography and breast tomosynthesis was performed.  COMPARISON:  Previous exam(s).  ACR Breast Density Category c: The breast tissue is heterogeneously dense, which may obscure small masses.  FINDINGS: Spot 2D magnification views and full field mL views of the left breast performed. There are grouped pleomorphic calcifications in the outer left breast spanning 1.8 cm. There is an associated asymmetry. No additional new findings identified elsewhere in the left breast.  IMPRESSION: Suspicious calcifications spanning 1.8 cm in the outer left breast with an associated asymmetry. Patient was not able to stay for recommended left breast ultrasound.  RECOMMENDATION: 1.  Left breast ultrasound.  2. If a mass is identified in the outer left breast associated with the calcifications seen mammographically, recommend ultrasound-guided core needle biopsy of this area. If no sonographic correlate is identified, recommend stereotactic core needle biopsy of the left breast calcifications.  I was not able to discussed the recommendations with the patient in person today because she left in order to return to work. She will be contacted by our scheduler to make the follow-up appointments.  BI-RADS CATEGORY  4: Suspicious.   Electronically Signed   By: Audie Pinto M.D.   On: 12/04/2020 15:32  ADDENDUM REPORT: 12/13/2020 14:56  ADDENDUM: PATHOLOGY revealed: Site A. BREAST, LEFT AT 3:30 O'CLOCK, 3 CM FROM THE NIPPLE; ULTRASOUND-GUIDED CORE NEEDLE BIOPSY: - INVASIVE MAMMARY CARCINOMA, NO SPECIAL TYPE. 11 mm in this sample. Grade 3. Ductal carcinoma in situ: Present, high-grade with comedonecrosis. Lymphovascular invasion: Not identified.  Pathology results are  CONCORDANT with imaging findings, per Dr. Ammie Ferrier.  PATHOLOGY revealed: Site B. BREAST, LEFT AT 3:30 O'CLOCK, 1 CM FROM THE NIPPLE; ULTRASOUND-GUIDED CORE NEEDLE BIOPSY: - DUCTAL CARCINOMA IN SITU, HIGH-GRADE. - NO EVIDENCE OF INFILTRATING MAMMARY CARCINOMA.  Pathology results are CONCORDANT with imaging findings, per Dr. Ammie Ferrier.  Pathology results and recommendations below were discussed  with patient by telephone on 12/13/2020. Patient reported biopsy site within normal limits with slight tenderness at the site. Post biopsy care instructions were reviewed, questions were answered and my direct phone number was provided to patient. Patient was instructed to call [facility] if any concerns or questions arise related to the biopsy.  Recommendations:  1. Surgical consultation. Request for surgical consultation relayed to Al Pimple RN and Tanya Nones RN at [facility] by Electa Sniff RN on 12/13/2020. Note that there is abnormal tissue seen between the two sites of biopsy in the left breast, and bracketing is recommended for localization prior to surgery if the patient undergoes breast conservation.  2. MRI recommended to evaluate extent of disease due to the multifocal disease and high grade DCIS, with other possible sites of disease seen on mammogram and ultrasound. The patient also has dense, heterogeneous breast tissue. If no other sites of disease are seen for targeting on MRI, recommend stereotactic guided biopsy of the 37m round mass anterior to the spring shaped biopsy marking clip in the left breast on the mammogram.  Pathology results reported by LElecta SniffRN on 12/13/2020.   Electronically Signed   By: MAmmie FerrierM.D.   On: 12/13/2020 14:56 CLINICAL DATA:  Status post ultrasound-guided biopsies of 2 LEFT breast masses at the 3:30 o'clock axis, 3 cm from the nipple and 1 cm from the nipple respectively.  Both biopsy sites were positive for malignancy.  Largest mass was located at the 3:30 o'clock axis measuring 1.4 cm greatest dimension. Altogether, these masses measured 3.7 cm extent by ultrasound. Postprocedure diagnostic mammogram of 12/12/2020 showed the extent from the most posterior calcifications to the most anterior clip to be 4.1 cm.  Q shaped clip placed at the biopsy site located 3 cm from the nipple. Vision clip placed at the biopsy site located 1 cm from the nipple.  LABS:  Not performed at imaging center.  EXAM: BILATERAL BREAST MRI WITH AND WITHOUT CONTRAST  TECHNIQUE: Multiplanar, multisequence MR images of both breasts were obtained prior to and following the intravenous administration of 8 ml of Gadavist  Three-dimensional MR images were rendered by post-processing of the original MR data on an independent workstation. The three-dimensional MR images were interpreted, and findings are reported in the following complete MRI report for this study. Three dimensional images were evaluated at the independent interpreting workstation using the DynaCAD thin client.  COMPARISON:  Previous exams including diagnostic mammogram dated 12/04/2020, ultrasound-guided biopsy dated 12/12/2020 and LEFT breast diagnostic mammogram dated 12/12/2020.  FINDINGS: Breast composition: c. Heterogeneous fibroglandular tissue.  Background parenchymal enhancement: Mild  Right breast: No suspicious enhancing mass, non-mass enhancement or secondary signs of malignancy within the RIGHT breast.  Left breast: 2 biopsy site markers are identified within the lower outer quadrant of the LEFT breast, located approximately 2 cm apart, corresponding to the 2 sites of biopsy-proven malignancy. Contiguous mass and non-mass enhancement is present within the lower outer quadrant of the LEFT breast, including these 2 biopsy site markers, measuring 5 cm extent (series 10, image  37).  The most posterior portion of this contiguous mass and non-mass enhancement, corresponding to the known malignancies, is located 1.2 cm posterior to the most posterior biopsy clip, corresponding to the most posterior extent of calcifications seen on postprocedure mammogram of 12/12/2020. The craniocaudal extent measures up to 2.5 cm at the level of the most anterior biopsy site marker.  There is additional contiguous linear non-mass enhancement  extending to the skin of the lower outer quadrant of the LEFT breast, at anterior depth, versus contiguous prominent superficial blood vessel (series 10, images 18-31).  Additional oval circumscribed enhancing mass is seen within the retroareolar LEFT breast, at far posterior depth, stable mammographically for greater than 5 years indicating benignity.  No suspicious enhancing masses, non-mass enhancement or secondary signs of malignancy elsewhere within the LEFT breast.  Lymph nodes: No abnormal appearing lymph nodes.  Ancillary findings:  None.  IMPRESSION: 1. Biopsy-proven malignancies within the lower outer quadrant of the LEFT breast. The associated contiguous mass and non-mass enhancement within the lower outer quadrant of the LEFT breast measures 5 cm AP extent, extending 1.2 cm posterior to the most posterior biopsy clip (corresponding to the most posterior extent of calcifications seen on postprocedure mammogram of 12/12/2020) and extending approximately 2 cm inferior to the most anterior biopsy site marker. 2. Additional contiguous linear non-mass enhancement extending to the skin of the lower outer quadrant (series 10, images 18 through 31), suspicious for skin involvement, versus adjacent prominent superficial blood vessel. 3. No evidence of multicentric disease within the LEFT breast. 4. No evidence of malignancy within the RIGHT breast. 5. No evidence of metastatic lymphadenopathy.  RECOMMENDATION: Per  treatment plan for patient's known LEFT breast malignancies (2 biopsy-proven sites within the lower outer quadrant), overall measuring 5 cm extent.  BI-RADS CATEGORY  6: Known biopsy-proven malignancy.   Electronically Signed   By: Franki Cabot M.D.   On: 12/23/2020 11:02  Assessment    Invasive mammary carcinoma left breast, DCIS left breast.  Multifocal.  Triple positive:  ER/PR and HER-2 positive.  Patient Active Problem List   Diagnosis Date Noted  . Malignant neoplasm of lower-outer quadrant of left breast of female, estrogen receptor positive ([place]) 12/25/2020  . Neoplasm of uncertain behavior of lower outer quadrant of female breast, left 12/17/2020    Plan    She had elected to proceed with left total mastectomy, with immediate reconstruction.  Port-A-Cath placement, right subclavian anticipated site. Risks reviewed, and accepted.   Scheduled for Monday, February 10, 2021. Face-to-face time spent with the patient and accompanying care providers(if present) was 20 minutes, with more than 50% of the time spent counseling, educating, and coordinating care of the patient.      Ronny Bacon M.D., FACS 02/06/2021, 8:59 AM

## 2021-02-06 NOTE — Patient Instructions (Signed)
If you have any concerns or questions, please feel free to call our office.       Exercises Following Breast Surgery The following exercises are recommended and safe to do after you have surgery on your breast or your lymph nodes near your armpit (axillary nodes). These exercises may improve your ability to move your chest, shoulders, and back (mobility). This can help to relieve pain, swelling, and stiffness that you may experience after surgery. Exercises Ask your health care provider which exercises are safe for you. Do exercises exactly as told by your health care provider and adjust them as directed. You should not feel pain when you do these exercises. Stop right away if you feel any pain. Do not begin these exercises until told by your health care provider. Deep breathing 1. Lie down on your back. You may bend your knees for comfort. 2. Slowly breathe in as much air as you can and expand your chest and abdomen. ? Think about pushing your belly button away from your spine while you do this. ? It may help to put your hands on your belly so you can feel it expand. 3. Slowly breathe out. Repeat these steps 4-5 times or the number of times that is comfortable for you.   Wand exercise 1. Lie down on your back. You may bend your knees for comfort. 2. Position your hands so your thumbs are pointing to each other. With both hands, hold a wand-shaped object, such as a broom handle or a cane. Your hands should be about shoulder width apart on the object. 3. Start with the object resting across your hips. 4. Slowly lift the wand toward the ceiling. Use your unaffected arm to help lift the wand. If it is comfortable for you, lengthen the movement to go from your hips to over your head. Repeat these steps 5-7 times. Elbow winging 1. Lie down on your back. You may bend your knees for comfort. 2. Clasp your hands together behind your head so that your elbows point toward the ceiling. 3. Keep your  hands together and move your elbows apart and down toward the floor as far as you comfortably can. Repeat these steps 5-7 times. Swelling reduction, lying down 1. Lie down on your back. You may bend your knees for comfort. 2. Raise (elevate) your affected arm above the level of your heart and keep it elevated during the exercise. You may rest your arm on top of pillows to make this easier. 3. Open and close your hand 15-25 times in a row. 4. Bend and straighten your elbow 15-25 times in a row. 5. You may keep your arm elevated for up to 45 minutes at a time. This helps to reduce swelling. Do this exercise 2-4 times a day. Swelling reduction, sitting 1. Hold a tennis ball, a rolled-up towel, or a similar object in your hand on your affected side. 2. Slowly squeeze the ball or towel. Try to do this using only your hand muscles. 3. Relax your hand. Repeat these steps 15-25 times.   Shoulder blade stretch 1. Sit in a chair, facing a table. Your back should be against the back of the chair. 2. Place your unaffected arm on the table with your palm down and your elbow bent. This arm will support you and will not move during the exercise. 3. Place your affected arm on the table with your palm down and your elbow straight. 4. Slide your affected arm forward, toward the opposite  side of the table, but avoid leaning forward. ? While you do this, you should feel your shoulder blade on your affected side move away from the back of the chair. ? You may put a towel under your hand to help it slide more easily. Repeat these steps 5-7 times. Shoulder blade squeeze 1. Sit in a stable chair with good posture. Avoid letting your back touch the back of the chair. 2. Your arms should be at your sides with your elbows bent. You may rest your forearms on a pillow. 3. Squeeze your shoulder blades together. Think about trying to bring them down and back. ? Keep your shoulders level. ? Do not lift your shoulders up  toward your ears. 4. Relax your muscles completely before you repeat this exercise. Repeat these steps 5-7 times. Side bend 1. Sit in a stable chair with your feet flat on the floor. You may put your feet shoulder-width apart to help you feel more stable. 2. Clasp your hands together in your lap and lift your hands slowly over your head until your arms are straight. You may use your unaffected arm to help lift your other arm. 3. With your arms above your head, bend at your waist to your right so you feel a stretch in your left side. 4. Straighten your abdomen and move your arms back to the center, above your head. 5. Repeat step 3 on the other side of your body, by bending to the left until you feel a stretch in your right side. Repeat these steps 5-7 times. Chest wall stretch 1. Stand facing a corner. Put one foot near the corner and the other foot about 18 inches (45 cm) behind the forward foot. It does not matter which foot is forward. 2. Bend your elbows and place your forearms on the wall, one on each side of the corner. Your elbows should be as close to shoulder height as possible. 3. Keep your body straight as you move your hips toward the corner. Do not let your shoulders move up toward your ears. Repeat these steps 5-7 times. Wall climb, flexion 1. Stand facing a wall with your toes about 8-10 inches (20-25 cm) from the wall. 2. Place your hands on the wall, about shoulder width apart. 3. Move your hands up the wall and stretch toward the ceiling (flexion). ? Do not let your shoulders shrug. ? Do not arch your back. Repeat these steps 5-7 times. Wall climb, abduction 1. Stand about 18 inches (45.7 cm) from a wall, with your affected side facing the wall. 2. Bend the elbow of your arm on your affected side, and place your hand on the wall. 3. Move your hand up the wall and toward the ceiling (abduction). Do not let your shoulders shrug. Repeat these steps 5-7 times. Contact a  health care provider if you:  Feel like you are getting weaker.  Have trouble doing any of the exercises.  Often fall or lose your balance.  Have pain or swelling that gets worse.  Develop: ? New pain or swelling. ? A feeling of heaviness in your arm. ? Headaches that are different than usual. ? Numbness or tingling in your arms or chest.  Become dizzy.  Have blurry vision.  Develop problems in your incisions, such as bleeding or drainage. This information is not intended to replace advice given to you by your health care provider. Make sure you discuss any questions you have with your health care provider. Document Revised:  02/16/2019 Document Reviewed: 07/29/2018 Elsevier Patient Education  [place].

## 2021-02-06 NOTE — Progress Notes (Signed)
Patient ID: Judy Padilla, female   DOB: 23-Aug-1966, 55 y.o.   MRN: [id number]  Chief Complaint: Left breast cancer  History of Present Illness She presents today in preparation for her left total mastectomy with SLNBx and reconstruction scheduled for Monday April 4.   She reminded me about the consideration of getting her Port-A-Cath placed in the same OR visit. I reviewed Dr. Aletha Halim note, indicating he did want a MediPort placed. So I believe we can get this accomplished in her same trip to the OR. Previously on initial presentation: Judy Padilla is a 55 y.o. female with recent diagnosis of left breast cancer, multifocal at 330 of the left breast.  At 3 cm from nipple is an invasive carcinoma about 11 mm on the sample, grade 3.  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  It was thought prudent to proceed with MRI to better determine the extent of her DCIS. Her biopsy was on February 3, and her prognostic indicators were not back yet.  She utilized birth control for about 1 year in 2000.  She is postmenopausal.  She has a family history of breast cancer in her sister.  She began menstruating at the age of 68 she has been pregnant once at the age of 5.  She has not felt a mass, noted any discharge from the nipple or any skin changes.  She has had no breast pain and she does not do monthly exams but she does do yearly mammography.   Past Medical History Past Medical History:  Diagnosis Date  . Asthma    well controlled  . Breast cancer ([place])   . Family history of adverse reaction to anesthesia    sister-hard time waking up  . GERD (gastroesophageal reflux disease)   . Hypertension       Past Surgical History:  Procedure Laterality Date  . ABLATION    . BREAST BIOPSY Left 2011   Benign per pt  . BREAST BIOPSY Left 12/12/2020   3:30 3 cmfn, Q marker, path pending  . BREAST BIOPSY Left 12/12/2020   3:30 1 cmfn, Vision marker, path pending  . COLONOSCOPY   01/15/2020    Allergies  Allergen Reactions  . Shrimp Extract Allergy Skin Test Shortness Of Breath  . Other Swelling    Cannot take Excedrin     Current Outpatient Medications  Medication Sig Dispense Refill  . albuterol (VENTOLIN HFA) 108 (90 Base) MCG/ACT inhaler Inhale 1 puff into the lungs every 6 (six) hours as needed for shortness of breath.    Marland Kitchen amLODipine (NORVASC) 10 MG tablet Take 10 mg by mouth every morning.    Marland Kitchen atorvastatin (LIPITOR) 80 MG tablet Take 80 mg by mouth every morning.    . budesonide (PULMICORT) 0.5 MG/2ML nebulizer solution Take 2 mLs by nebulization daily.    . cetirizine (ZYRTEC) 10 MG tablet Take 10 mg by mouth daily as needed for allergies.    . Cholecalciferol 1.25 MG (50000 UT) capsule Take 50,000 Units by mouth once a week.    . diazepam (VALIUM) 2 MG tablet Take 1 tablet (2 mg total) by mouth every 12 (twelve) hours as needed for muscle spasms. (Patient not taking: Reported on 02/03/2021) 20 tablet 0  . EPINEPHrine 0.3 mg/0.3 mL IJ SOAJ injection Inject 0.3 mg into the muscle as needed for anaphylaxis. (Patient not taking: Reported on 01/28/2021)    . fluticasone (FLONASE) 50 MCG/ACT nasal spray Place 1 spray into  both nostrils daily as needed for allergies.    . fluticasone furoate-vilanterol (BREO ELLIPTA) 100-25 MCG/INH AEPB Inhale 1 puff into the lungs as needed for shortness of breath.    . GNP BUDESONIDE NASAL SPRAY NA Place 1 spray into the nose daily.    . Multiple Vitamins-Minerals (CENTRUM ADULTS) TABS Take 1 tablet by mouth daily.    . naproxen sodium (ALEVE) 220 MG tablet Take 220 mg by mouth daily as needed (knee pain).    . NON FORMULARY Takes weekly allergy shots    . ondansetron (ZOFRAN) 4 MG tablet Take 1 tablet (4 mg total) by mouth every 8 (eight) hours as needed for nausea or vomiting. (Patient not taking: Reported on 02/03/2021) 20 tablet 0  . pantoprazole (PROTONIX) 40 MG tablet Take 40 mg by mouth every morning.     No current  facility-administered medications for this visit.    Family History Family History  Problem Relation Age of Onset  . Hypertension Mother   . Diabetes Mother   . Hypertension Father   . Diabetes Father   . Cancer Father        prostate cancer-70s  . Cancer Maternal Grandmother        uterine cancer  . Breast cancer Sister        in in 8s.       Social History Social History   Tobacco Use  . Smoking status: Never Smoker  . Smokeless tobacco: Never Used  Vaping Use  . Vaping Use: Never used  Substance Use Topics  . Alcohol use: Not Currently  . Drug use: Never        Review of Systems  Constitutional: Negative.   HENT: Negative.   Eyes: Negative.   Respiratory: Negative.   Cardiovascular: Negative.   Gastrointestinal: Negative.   Genitourinary: Negative.   Skin: Negative.   Neurological: Negative.   Psychiatric/Behavioral: Negative.       Physical Exam Last menstrual period 01/04/2018.   CONSTITUTIONAL: Well developed, and nourished, appropriately responsive and aware without distress.   EYES: Sclera non-icteric.   EARS, NOSE, MOUTH AND THROAT: Mask worn.   Hearing is intact to voice.  NECK: Trachea is midline, and there is no jugular venous distension.  LYMPH NODES:  Lymph nodes in the neck are not enlarged. RESPIRATORY:  Lungs are clear, and breath sounds are equal bilaterally. Normal respiratory effort without pathologic use of accessory muscles. CARDIOVASCULAR: Heart is regular in rate and rhythm. GI: The abdomen is soft, nontender, and nondistended. There were no palpable masses. I did not appreciate hepatosplenomegaly. There were normal bowel sounds. GU: Her breast exam is unremarkable aside from the postbiopsy changes in the outer lower quadrant of the left breast.  No suspicious nor dominant masses in either breast present as per exam post biopsy. MUSCULOSKELETAL:  Symmetrical muscle tone appreciated in all four extremities.    SKIN: Skin turgor is  normal. No pathologic skin lesions appreciated.  NEUROLOGIC:  Motor and sensation appear grossly normal.  Cranial nerves are grossly without defect. PSYCH:  Alert and oriented to person, place and time. Affect is appropriate for situation.  Data Reviewed I have personally reviewed what is currently available of the patient's imaging, recent labs and medical records.   Labs:  CBC Latest Ref Rng & Units 02/04/2021  WBC 4.0 - 10.5 K/uL 5.8  Hemoglobin 12.0 - 15.0 g/dL 13.4  Hematocrit 36.0 - 46.0 % 41.2  Platelets 150 - 400 K/uL 257   CMP  Latest Ref Rng & Units 02/04/2021  Glucose 70 - 99 mg/dL 99  BUN 6 - 20 mg/dL 22(H)  Creatinine 0.44 - 1.00 mg/dL 0.90  Sodium 135 - 145 mmol/L 138  Potassium 3.5 - 5.1 mmol/L 4.1  Chloride 98 - 111 mmol/L 107  CO2 22 - 32 mmol/L 24  Calcium 8.9 - 10.3 mg/dL 9.4  Total Protein 6.5 - 8.1 g/dL 7.2  Total Bilirubin 0.3 - 1.2 mg/dL 0.9  Alkaline Phos 38 - 126 U/L 83  AST 15 - 41 U/L 22  ALT 0 - 44 U/L 28      Imaging: Radiology review:   CLINICAL DATA:  Screening.  EXAM: DIGITAL SCREENING BILATERAL MAMMOGRAM WITH TOMO AND CAD  COMPARISON:  Previous exam(s).  ACR Breast Density Category c: The breast tissue is heterogeneously dense, which may obscure small masses.  FINDINGS: In the left breast, calcifications in the outer aspect of the breast in the middle depth warrant further evaluation. In the right breast, no findings suspicious for malignancy. Images were processed with CAD.  IMPRESSION: Further evaluation is suggested for calcifications in the left breast.  RECOMMENDATION: Diagnostic mammogram of the left breast. (Code:[id number])  The patient will be contacted regarding the findings, and additional imaging will be scheduled.  BI-RADS CATEGORY  0: Incomplete. Need additional imaging evaluation and/or prior mammograms for comparison.   Electronically Signed   By: Claudie Revering M.D.   On: 11/19/2020 11:45 CLINICAL  DATA:  55 year old female presenting as a recall from screening for possible left breast calcifications.  EXAM: DIGITAL DIAGNOSTIC LEFT MAMMOGRAM WITH TOMOSYNTHESIS  TECHNIQUE: Left digital diagnostic mammography and breast tomosynthesis was performed.  COMPARISON:  Previous exam(s).  ACR Breast Density Category c: The breast tissue is heterogeneously dense, which may obscure small masses.  FINDINGS: Spot 2D magnification views and full field mL views of the left breast performed. There are grouped pleomorphic calcifications in the outer left breast spanning 1.8 cm. There is an associated asymmetry. No additional new findings identified elsewhere in the left breast.  IMPRESSION: Suspicious calcifications spanning 1.8 cm in the outer left breast with an associated asymmetry. Patient was not able to stay for recommended left breast ultrasound.  RECOMMENDATION: 1.  Left breast ultrasound.  2. If a mass is identified in the outer left breast associated with the calcifications seen mammographically, recommend ultrasound-guided core needle biopsy of this area. If no sonographic correlate is identified, recommend stereotactic core needle biopsy of the left breast calcifications.  I was not able to discussed the recommendations with the patient in person today because she left in order to return to work. She will be contacted by our scheduler to make the follow-up appointments.  BI-RADS CATEGORY  4: Suspicious.   Electronically Signed   By: Audie Pinto M.D.   On: 12/04/2020 15:32  ADDENDUM REPORT: 12/13/2020 14:56  ADDENDUM: PATHOLOGY revealed: Site A. BREAST, LEFT AT 3:30 O'CLOCK, 3 CM FROM THE NIPPLE; ULTRASOUND-GUIDED CORE NEEDLE BIOPSY: - INVASIVE MAMMARY CARCINOMA, NO SPECIAL TYPE. 11 mm in this sample. Grade 3. Ductal carcinoma in situ: Present, high-grade with comedonecrosis. Lymphovascular invasion: Not identified.  Pathology results are  CONCORDANT with imaging findings, per Dr. Ammie Ferrier.  PATHOLOGY revealed: Site B. BREAST, LEFT AT 3:30 O'CLOCK, 1 CM FROM THE NIPPLE; ULTRASOUND-GUIDED CORE NEEDLE BIOPSY: - DUCTAL CARCINOMA IN SITU, HIGH-GRADE. - NO EVIDENCE OF INFILTRATING MAMMARY CARCINOMA.  Pathology results are CONCORDANT with imaging findings, per Dr. Ammie Ferrier.  Pathology results and recommendations below were discussed  with patient by telephone on 12/13/2020. Patient reported biopsy site within normal limits with slight tenderness at the site. Post biopsy care instructions were reviewed, questions were answered and my direct phone number was provided to patient. Patient was instructed to call [facility] if any concerns or questions arise related to the biopsy.  Recommendations:  1. Surgical consultation. Request for surgical consultation relayed to Al Pimple RN and Tanya Nones RN at [facility] by Electa Sniff RN on 12/13/2020. Note that there is abnormal tissue seen between the two sites of biopsy in the left breast, and bracketing is recommended for localization prior to surgery if the patient undergoes breast conservation.  2. MRI recommended to evaluate extent of disease due to the multifocal disease and high grade DCIS, with other possible sites of disease seen on mammogram and ultrasound. The patient also has dense, heterogeneous breast tissue. If no other sites of disease are seen for targeting on MRI, recommend stereotactic guided biopsy of the 16m round mass anterior to the spring shaped biopsy marking clip in the left breast on the mammogram.  Pathology results reported by LElecta SniffRN on 12/13/2020.   Electronically Signed   By: MAmmie FerrierM.D.   On: 12/13/2020 14:56 CLINICAL DATA:  Status post ultrasound-guided biopsies of 2 LEFT breast masses at the 3:30 o'clock axis, 3 cm from the nipple and 1 cm from the nipple respectively.  Both biopsy sites were positive for malignancy.  Largest mass was located at the 3:30 o'clock axis measuring 1.4 cm greatest dimension. Altogether, these masses measured 3.7 cm extent by ultrasound. Postprocedure diagnostic mammogram of 12/12/2020 showed the extent from the most posterior calcifications to the most anterior clip to be 4.1 cm.  Q shaped clip placed at the biopsy site located 3 cm from the nipple. Vision clip placed at the biopsy site located 1 cm from the nipple.  LABS:  Not performed at imaging center.  EXAM: BILATERAL BREAST MRI WITH AND WITHOUT CONTRAST  TECHNIQUE: Multiplanar, multisequence MR images of both breasts were obtained prior to and following the intravenous administration of 8 ml of Gadavist  Three-dimensional MR images were rendered by post-processing of the original MR data on an independent workstation. The three-dimensional MR images were interpreted, and findings are reported in the following complete MRI report for this study. Three dimensional images were evaluated at the independent interpreting workstation using the DynaCAD thin client.  COMPARISON:  Previous exams including diagnostic mammogram dated 12/04/2020, ultrasound-guided biopsy dated 12/12/2020 and LEFT breast diagnostic mammogram dated 12/12/2020.  FINDINGS: Breast composition: c. Heterogeneous fibroglandular tissue.  Background parenchymal enhancement: Mild  Right breast: No suspicious enhancing mass, non-mass enhancement or secondary signs of malignancy within the RIGHT breast.  Left breast: 2 biopsy site markers are identified within the lower outer quadrant of the LEFT breast, located approximately 2 cm apart, corresponding to the 2 sites of biopsy-proven malignancy. Contiguous mass and non-mass enhancement is present within the lower outer quadrant of the LEFT breast, including these 2 biopsy site markers, measuring 5 cm extent (series 10, image  37).  The most posterior portion of this contiguous mass and non-mass enhancement, corresponding to the known malignancies, is located 1.2 cm posterior to the most posterior biopsy clip, corresponding to the most posterior extent of calcifications seen on postprocedure mammogram of 12/12/2020. The craniocaudal extent measures up to 2.5 cm at the level of the most anterior biopsy site marker.  There is additional contiguous linear non-mass enhancement  extending to the skin of the lower outer quadrant of the LEFT breast, at anterior depth, versus contiguous prominent superficial blood vessel (series 10, images 18-31).  Additional oval circumscribed enhancing mass is seen within the retroareolar LEFT breast, at far posterior depth, stable mammographically for greater than 5 years indicating benignity.  No suspicious enhancing masses, non-mass enhancement or secondary signs of malignancy elsewhere within the LEFT breast.  Lymph nodes: No abnormal appearing lymph nodes.  Ancillary findings:  None.  IMPRESSION: 1. Biopsy-proven malignancies within the lower outer quadrant of the LEFT breast. The associated contiguous mass and non-mass enhancement within the lower outer quadrant of the LEFT breast measures 5 cm AP extent, extending 1.2 cm posterior to the most posterior biopsy clip (corresponding to the most posterior extent of calcifications seen on postprocedure mammogram of 12/12/2020) and extending approximately 2 cm inferior to the most anterior biopsy site marker. 2. Additional contiguous linear non-mass enhancement extending to the skin of the lower outer quadrant (series 10, images 18 through 31), suspicious for skin involvement, versus adjacent prominent superficial blood vessel. 3. No evidence of multicentric disease within the LEFT breast. 4. No evidence of malignancy within the RIGHT breast. 5. No evidence of metastatic lymphadenopathy.  RECOMMENDATION: Per  treatment plan for patient's known LEFT breast malignancies (2 biopsy-proven sites within the lower outer quadrant), overall measuring 5 cm extent.  BI-RADS CATEGORY  6: Known biopsy-proven malignancy.   Electronically Signed   By: Franki Cabot M.D.   On: 12/23/2020 11:02  Assessment    Invasive mammary carcinoma left breast, DCIS left breast.  Multifocal.  Triple positive:  ER/PR and HER-2 positive.  Patient Active Problem List   Diagnosis Date Noted  . Malignant neoplasm of lower-outer quadrant of left breast of female, estrogen receptor positive ([place]) 12/25/2020  . Neoplasm of uncertain behavior of lower outer quadrant of female breast, left 12/17/2020    Plan    She had elected to proceed with left total mastectomy, with immediate reconstruction.  Port-A-Cath placement, right subclavian anticipated site. Risks reviewed, and accepted.   Scheduled for Monday, February 10, 2021. Face-to-face time spent with the patient and accompanying care providers(if present) was 20 minutes, with more than 50% of the time spent counseling, educating, and coordinating care of the patient.      Ronny Bacon M.D., FACS 02/06/2021, 8:59 AM

## 2021-02-09 MED ORDER — CEFAZOLIN SODIUM-DEXTROSE 2-4 GM/100ML-% IV SOLN
2.0000 g | INTRAVENOUS | Status: AC
  Administered 2021-02-10: 2 g via INTRAVENOUS

## 2021-02-09 MED ORDER — CHLORHEXIDINE GLUCONATE CLOTH 2 % EX PADS: 6.0000 | MEDICATED_PAD | Freq: Once | CUTANEOUS | Status: DC

## 2021-02-09 MED ORDER — ORAL CARE MOUTH RINSE: 15.0000 mL | Freq: Once | OROMUCOSAL | Status: AC

## 2021-02-09 MED ORDER — CELECOXIB 200 MG PO CAPS: 200.0000 mg | ORAL_CAPSULE | ORAL | Status: AC

## 2021-02-09 MED ORDER — GABAPENTIN 300 MG PO CAPS: 300.0000 mg | ORAL_CAPSULE | ORAL | Status: AC

## 2021-02-09 MED ORDER — BUPIVACAINE LIPOSOME 1.3 % IJ SUSP: 20.0000 mL | Freq: Once | INTRAMUSCULAR | Status: DC

## 2021-02-09 MED ORDER — ACETAMINOPHEN 500 MG PO TABS: 1000.0000 mg | ORAL_TABLET | ORAL | Status: AC

## 2021-02-09 MED ORDER — CEFAZOLIN SODIUM-DEXTROSE 2-4 GM/100ML-% IV SOLN: 2.0000 g | INTRAVENOUS | Status: DC

## 2021-02-09 MED ORDER — LACTATED RINGERS IV SOLN: INTRAVENOUS | Status: DC

## 2021-02-09 MED ORDER — CHLORHEXIDINE GLUCONATE 0.12 % MT SOLN: 15.0000 mL | Freq: Once | OROMUCOSAL | Status: AC

## 2021-02-10 ENCOUNTER — Ambulatory Visit
Admission: RE | Admit: 2021-02-10 | Discharge: 2021-02-10 | Disposition: A | Discharge status: Home / Self Care | Payer: No Typology Code available for payment source | Source: Ambulatory Visit | Attending: Surgery | Admitting: Surgery

## 2021-02-10 ENCOUNTER — Other Ambulatory Visit: Payer: Self-pay

## 2021-02-10 ENCOUNTER — Ambulatory Visit: Payer: No Typology Code available for payment source

## 2021-02-10 ENCOUNTER — Encounter: Payer: Self-pay | Admitting: Surgery

## 2021-02-10 ENCOUNTER — Encounter
Admission: RE | Disposition: A | Discharge status: Home / Self Care | Payer: Self-pay | Source: Ambulatory Visit | Attending: Surgery

## 2021-02-10 ENCOUNTER — Ambulatory Visit: Payer: No Typology Code available for payment source | Admitting: Urgent Care

## 2021-02-10 DIAGNOSIS — Z170 Estrogen receptor positive status [ER+]: Secondary | ICD-10-CM

## 2021-02-10 DIAGNOSIS — Z79899 Other long term (current) drug therapy: Secondary | ICD-10-CM | POA: Diagnosis not present

## 2021-02-10 DIAGNOSIS — R928 Other abnormal and inconclusive findings on diagnostic imaging of breast: Secondary | ICD-10-CM | POA: Diagnosis not present

## 2021-02-10 DIAGNOSIS — C50412 Malignant neoplasm of upper-outer quadrant of left female breast: Secondary | ICD-10-CM | POA: Diagnosis not present

## 2021-02-10 DIAGNOSIS — C50512 Malignant neoplasm of lower-outer quadrant of left female breast: Secondary | ICD-10-CM | POA: Insufficient documentation

## 2021-02-10 DIAGNOSIS — Z452 Encounter for adjustment and management of vascular access device: Secondary | ICD-10-CM | POA: Diagnosis not present

## 2021-02-10 DIAGNOSIS — Z95828 Presence of other vascular implants and grafts: Secondary | ICD-10-CM

## 2021-02-10 DIAGNOSIS — I10 Essential (primary) hypertension: Secondary | ICD-10-CM | POA: Diagnosis not present

## 2021-02-10 HISTORY — PX: PORTACATH PLACEMENT: SHX2246

## 2021-02-10 HISTORY — PX: BREAST RECONSTRUCTION WITH PLACEMENT OF TISSUE EXPANDER AND FLEX HD (ACELLULAR HYDRATED DERMIS): SHX6295

## 2021-02-10 HISTORY — PX: SIMPLE MASTECTOMY WITH AXILLARY SENTINEL NODE BIOPSY: SHX6098

## 2021-02-10 SURGERY — SIMPLE MASTECTOMY WITH AXILLARY SENTINEL NODE BIOPSY
Anesthesia: General | Site: Chest | Laterality: Right

## 2021-02-10 MED ORDER — OXYCODONE HCL 5 MG PO TABS
5.0000 mg | ORAL_TABLET | ORAL | Status: DC | PRN
  Administered 2021-02-10: 5 mg via ORAL

## 2021-02-10 MED ORDER — BUPIVACAINE LIPOSOME 1.3 % IJ SUSP
INTRAMUSCULAR | Status: AC
  Filled 2021-02-10: qty 20

## 2021-02-10 MED ORDER — OXYCODONE HCL 5 MG PO TABS
ORAL_TABLET | ORAL | Status: AC
  Filled 2021-02-10: qty 1

## 2021-02-10 MED ORDER — DEXAMETHASONE SODIUM PHOSPHATE 10 MG/ML IJ SOLN
INTRAMUSCULAR | Status: DC | PRN
  Administered 2021-02-10: 10 mg via INTRAVENOUS

## 2021-02-10 MED ORDER — PROMETHAZINE HCL 25 MG/ML IJ SOLN: 6.2500 mg | INTRAMUSCULAR | Status: DC | PRN

## 2021-02-10 MED ORDER — FENTANYL CITRATE (PF) 100 MCG/2ML IJ SOLN
INTRAMUSCULAR | Status: AC
  Filled 2021-02-10: qty 2

## 2021-02-10 MED ORDER — FENTANYL CITRATE (PF) 100 MCG/2ML IJ SOLN
INTRAMUSCULAR | Status: DC | PRN
  Administered 2021-02-10: 50 ug via INTRAVENOUS
  Administered 2021-02-10 (×2): 25 ug via INTRAVENOUS

## 2021-02-10 MED ORDER — ISOSULFAN BLUE 1 % ~~LOC~~ SOLN
SUBCUTANEOUS | Status: AC
  Filled 2021-02-10: qty 5

## 2021-02-10 MED ORDER — LIDOCAINE HCL (CARDIAC) PF 100 MG/5ML IV SOSY
PREFILLED_SYRINGE | INTRAVENOUS | Status: DC | PRN
  Administered 2021-02-10: 100 mg via INTRAVENOUS

## 2021-02-10 MED ORDER — PROPOFOL 10 MG/ML IV BOLUS
INTRAVENOUS | Status: DC | PRN
  Administered 2021-02-10: 30 mg via INTRAVENOUS
  Administered 2021-02-10: 170 mg via INTRAVENOUS

## 2021-02-10 MED ORDER — DEXMEDETOMIDINE (PRECEDEX) IN NS 20 MCG/5ML (4 MCG/ML) IV SYRINGE
PREFILLED_SYRINGE | INTRAVENOUS | Status: AC
  Filled 2021-02-10: qty 5

## 2021-02-10 MED ORDER — SUCCINYLCHOLINE CHLORIDE 200 MG/10ML IV SOSY
PREFILLED_SYRINGE | INTRAVENOUS | Status: AC
  Filled 2021-02-10: qty 10

## 2021-02-10 MED ORDER — BUPIVACAINE-EPINEPHRINE (PF) 0.25% -1:200000 IJ SOLN
INTRAMUSCULAR | Status: DC | PRN
  Administered 2021-02-10: 9 mL

## 2021-02-10 MED ORDER — ONDANSETRON HCL 4 MG/2ML IJ SOLN
INTRAMUSCULAR | Status: DC | PRN
  Administered 2021-02-10: 4 mg via INTRAVENOUS

## 2021-02-10 MED ORDER — ACETAMINOPHEN 325 MG PO TABS: 650.0000 mg | ORAL_TABLET | ORAL | Status: DC | PRN

## 2021-02-10 MED ORDER — FENTANYL CITRATE (PF) 100 MCG/2ML IJ SOLN
25.0000 ug | INTRAMUSCULAR | Status: DC | PRN
  Administered 2021-02-10: 25 ug via INTRAVENOUS

## 2021-02-10 MED ORDER — CELECOXIB 200 MG PO CAPS
ORAL_CAPSULE | ORAL | Status: AC
  Administered 2021-02-10: 200 mg via ORAL
  Filled 2021-02-10: qty 1

## 2021-02-10 MED ORDER — BUPIVACAINE HCL (PF) 0.25 % IJ SOLN
INTRAMUSCULAR | Status: AC
  Filled 2021-02-10: qty 30

## 2021-02-10 MED ORDER — ROCURONIUM BROMIDE 10 MG/ML (PF) SYRINGE
PREFILLED_SYRINGE | INTRAVENOUS | Status: AC
  Filled 2021-02-10: qty 10

## 2021-02-10 MED ORDER — PROPOFOL 10 MG/ML IV BOLUS
INTRAVENOUS | Status: AC
  Filled 2021-02-10: qty 20

## 2021-02-10 MED ORDER — TECHNETIUM TC 99M TILMANOCEPT KIT
1.0000 | PACK | Freq: Once | INTRAVENOUS | Status: AC | PRN
  Administered 2021-02-10: 1.1 via INTRADERMAL

## 2021-02-10 MED ORDER — PHENYLEPHRINE HCL (PRESSORS) 10 MG/ML IV SOLN
INTRAVENOUS | Status: DC | PRN
  Administered 2021-02-10: 50 ug via INTRAVENOUS
  Administered 2021-02-10 (×5): 100 ug via INTRAVENOUS

## 2021-02-10 MED ORDER — EPHEDRINE SULFATE 50 MG/ML IJ SOLN
INTRAMUSCULAR | Status: DC | PRN
  Administered 2021-02-10 (×3): 5 mg via INTRAVENOUS
  Administered 2021-02-10: 10 mg via INTRAVENOUS
  Administered 2021-02-10: 5 mg via INTRAVENOUS
  Administered 2021-02-10: 10 mg via INTRAVENOUS

## 2021-02-10 MED ORDER — ISOSULFAN BLUE 1 % ~~LOC~~ SOLN
SUBCUTANEOUS | Status: DC | PRN
  Administered 2021-02-10: 4 mL via SUBCUTANEOUS

## 2021-02-10 MED ORDER — EPHEDRINE 5 MG/ML INJ
INTRAVENOUS | Status: AC
  Filled 2021-02-10: qty 10

## 2021-02-10 MED ORDER — MIDAZOLAM HCL 2 MG/2ML IJ SOLN
INTRAMUSCULAR | Status: AC
  Filled 2021-02-10: qty 2

## 2021-02-10 MED ORDER — NEOMYCIN-POLYMYXIN B GU 40-200000 IR SOLN
Status: AC
  Filled 2021-02-10: qty 1

## 2021-02-10 MED ORDER — BUPIVACAINE-EPINEPHRINE (PF) 0.25% -1:200000 IJ SOLN
INTRAMUSCULAR | Status: AC
  Filled 2021-02-10: qty 30

## 2021-02-10 MED ORDER — ONDANSETRON HCL 4 MG/2ML IJ SOLN
INTRAMUSCULAR | Status: AC
  Filled 2021-02-10: qty 2

## 2021-02-10 MED ORDER — MIDAZOLAM HCL 2 MG/2ML IJ SOLN
INTRAMUSCULAR | Status: DC | PRN
  Administered 2021-02-10: 2 mg via INTRAVENOUS

## 2021-02-10 MED ORDER — ACETAMINOPHEN 500 MG PO TABS
ORAL_TABLET | ORAL | Status: AC
  Administered 2021-02-10: 1000 mg via ORAL
  Filled 2021-02-10: qty 2

## 2021-02-10 MED ORDER — FENTANYL CITRATE (PF) 100 MCG/2ML IJ SOLN: 25.0000 ug | INTRAMUSCULAR | Status: DC | PRN

## 2021-02-10 MED ORDER — CHLORHEXIDINE GLUCONATE 0.12 % MT SOLN
OROMUCOSAL | Status: AC
  Administered 2021-02-10: 15 mL via OROMUCOSAL
  Filled 2021-02-10: qty 15

## 2021-02-10 MED ORDER — SODIUM CHLORIDE (PF) 0.9 % IJ SOLN
INTRAMUSCULAR | Status: AC
  Filled 2021-02-10: qty 50

## 2021-02-10 MED ORDER — GABAPENTIN 300 MG PO CAPS
ORAL_CAPSULE | ORAL | Status: AC
  Administered 2021-02-10: 300 mg via ORAL
  Filled 2021-02-10: qty 1

## 2021-02-10 MED ORDER — ACETAMINOPHEN 650 MG RE SUPP: 650.0000 mg | RECTAL | Status: DC | PRN

## 2021-02-10 MED ORDER — SODIUM CHLORIDE (PF) 0.9 % IJ SOLN
INTRAMUSCULAR | Status: AC
  Filled 2021-02-10: qty 10

## 2021-02-10 MED ORDER — BUPIVACAINE-EPINEPHRINE 0.25% -1:200000 IJ SOLN: INTRAMUSCULAR | Status: DC | PRN

## 2021-02-10 MED ORDER — HEPARIN SODIUM (PORCINE) 5000 UNIT/ML IJ SOLN
INTRAMUSCULAR | Status: AC
  Filled 2021-02-10: qty 1

## 2021-02-10 MED ORDER — SODIUM CHLORIDE 0.9% FLUSH: 3.0000 mL | Freq: Two times a day (BID) | INTRAVENOUS | Status: DC

## 2021-02-10 MED ORDER — CEFAZOLIN SODIUM-DEXTROSE 2-4 GM/100ML-% IV SOLN
INTRAVENOUS | Status: AC
  Filled 2021-02-10: qty 100

## 2021-02-10 MED ORDER — DEXMEDETOMIDINE (PRECEDEX) IN NS 20 MCG/5ML (4 MCG/ML) IV SYRINGE
PREFILLED_SYRINGE | INTRAVENOUS | Status: DC | PRN
  Administered 2021-02-10: 8 ug via INTRAVENOUS
  Administered 2021-02-10: 4 ug via INTRAVENOUS
  Administered 2021-02-10: 8 ug via INTRAVENOUS

## 2021-02-10 MED ORDER — SODIUM CHLORIDE 0.9 % IV SOLN
INTRAVENOUS | Status: DC | PRN
  Administered 2021-02-10: 4 mL via INTRAMUSCULAR

## 2021-02-10 MED ORDER — SODIUM CHLORIDE 0.9 % IV SOLN: 250.0000 mL | INTRAVENOUS | Status: DC | PRN

## 2021-02-10 MED ORDER — LIDOCAINE HCL (PF) 2 % IJ SOLN
INTRAMUSCULAR | Status: AC
  Filled 2021-02-10: qty 5

## 2021-02-10 MED ORDER — SODIUM CHLORIDE 0.9% FLUSH: 3.0000 mL | INTRAVENOUS | Status: DC | PRN

## 2021-02-10 MED ORDER — DEXAMETHASONE SODIUM PHOSPHATE 10 MG/ML IJ SOLN
INTRAMUSCULAR | Status: AC
  Filled 2021-02-10: qty 1

## 2021-02-10 MED ORDER — SODIUM CHLORIDE 0.9 % IV SOLN
INTRAVENOUS | Status: DC | PRN
  Administered 2021-02-10: 10 mL via INTRAMUSCULAR

## 2021-02-10 SURGICAL SUPPLY — 96 items
ADH SKN CLS APL DERMABOND .7 (GAUZE/BANDAGES/DRESSINGS) ×6
APL PRP STRL LF DISP 70% ISPRP (MISCELLANEOUS) ×4
APPLIER CLIP 9.375 SM OPEN (CLIP) ×3
APR CLP SM 9.3 20 MLT OPN (CLIP) ×2
BAG DECANTER FOR FLEXI CONT (MISCELLANEOUS) ×6 IMPLANT
BINDER BREAST LRG (GAUZE/BANDAGES/DRESSINGS) IMPLANT
BINDER BREAST MEDIUM (GAUZE/BANDAGES/DRESSINGS) IMPLANT
BINDER BREAST XLRG (GAUZE/BANDAGES/DRESSINGS) ×3 IMPLANT
BINDER BREAST XXLRG (GAUZE/BANDAGES/DRESSINGS) IMPLANT
BIOPATCH WHT 1IN DISK W/4.0 H (GAUZE/BANDAGES/DRESSINGS) ×3 IMPLANT
BLADE BOVIE TIP EXT 4 (BLADE) ×3 IMPLANT
BLADE SURG 15 STRL LF DISP TIS (BLADE) ×4 IMPLANT
BLADE SURG 15 STRL SS (BLADE) ×6
BLADE SURG SZ10 CARB STEEL (BLADE) ×6 IMPLANT
BNDG GAUZE 4.5X4.1 6PLY STRL (MISCELLANEOUS) ×6 IMPLANT
BOOT SUTURE AID YELLOW STND (SUTURE) ×3 IMPLANT
BULB RESERV EVAC DRAIN JP 100C (MISCELLANEOUS) ×3 IMPLANT
CANISTER SUCT 1200ML W/VALVE (MISCELLANEOUS) IMPLANT
CHLORAPREP W/TINT 26 (MISCELLANEOUS) ×6 IMPLANT
CLIP APPLIE 9.375 SM OPEN (CLIP) ×2 IMPLANT
CLIP VESOCCLUDE MED 6/CT (CLIP) IMPLANT
COVER LIGHT HANDLE STERIS (MISCELLANEOUS) ×6 IMPLANT
COVER WAND RF STERILE (DRAPES) ×3 IMPLANT
DECANTER SPIKE VIAL GLASS SM (MISCELLANEOUS) IMPLANT
DERMABOND ADVANCED (GAUZE/BANDAGES/DRESSINGS) ×3
DERMABOND ADVANCED .7 DNX12 (GAUZE/BANDAGES/DRESSINGS) ×6 IMPLANT
DRAIN CHANNEL JP 15F RND 16 (MISCELLANEOUS) IMPLANT
DRAIN CHANNEL JP 19F (MISCELLANEOUS) ×3 IMPLANT
DRAPE C-ARM XRAY 36X54 (DRAPES) ×3 IMPLANT
DRAPE LAPAROTOMY TRNSV 106X77 (MISCELLANEOUS) ×3 IMPLANT
DRSG GAUZE FLUFF 36X18 (GAUZE/BANDAGES/DRESSINGS) ×3 IMPLANT
DRSG TELFA 3X8 NADH (GAUZE/BANDAGES/DRESSINGS) ×3 IMPLANT
ELECT CAUTERY BLADE 6.4 (BLADE) ×6 IMPLANT
ELECT CAUTERY BLADE TIP 2.5 (TIP) ×6
ELECT REM PT RETURN 9FT ADLT (ELECTROSURGICAL) ×3
ELECTRODE CAUTERY BLDE TIP 2.5 (TIP) ×4 IMPLANT
ELECTRODE REM PT RTRN 9FT ADLT (ELECTROSURGICAL) ×2 IMPLANT
GLOVE ORTHO TXT STRL SZ7.5 (GLOVE) ×6 IMPLANT
GLOVE SURG ENC MOIS LTX SZ6.5 (GLOVE) ×12 IMPLANT
GOWN STRL REUS W/ TWL LRG LVL3 (GOWN DISPOSABLE) ×12 IMPLANT
GOWN STRL REUS W/TWL LRG LVL3 (GOWN DISPOSABLE) ×18
GRAFT FLEX HD 6X16 PLIABLE (Tissue) ×3 IMPLANT
HEMOSTAT ARISTA ABSORB 3G PWDR (HEMOSTASIS) IMPLANT
IMPL EXPANDER BREAST 535CC (Breast) ×2 IMPLANT
IMPLANT BREAST 535CC (Breast) ×1 IMPLANT
IMPLANT EXPANDER BREAST 535CC (Breast) ×2 IMPLANT
IV NS 1000ML (IV SOLUTION)
IV NS 1000ML BAXH (IV SOLUTION) IMPLANT
IV NS 500ML (IV SOLUTION) ×3
IV NS 500ML BAXH (IV SOLUTION) ×2 IMPLANT
KIT FILL ASEPTIC TRANSFER (MISCELLANEOUS) ×3 IMPLANT
KIT PORT POWER 8FR ISP CVUE (Port) ×3 IMPLANT
KIT TURNOVER KIT A (KITS) ×3 IMPLANT
MANIFOLD NEPTUNE II (INSTRUMENTS) ×3 IMPLANT
NEEDLE FILTER BLUNT 18X 1/2SAF (NEEDLE) ×2
NEEDLE FILTER BLUNT 18X1 1/2 (NEEDLE) ×4 IMPLANT
NEEDLE HYPO 22GX1.5 SAFETY (NEEDLE) ×3 IMPLANT
PACK BASIN MAJOR ARMC (MISCELLANEOUS) ×3 IMPLANT
PACK PORT-A-CATH (MISCELLANEOUS) ×3 IMPLANT
PACK UNIVERSAL (MISCELLANEOUS) IMPLANT
PAD ABD DERMACEA PRESS 5X9 (GAUZE/BANDAGES/DRESSINGS) ×12 IMPLANT
PIN SAFETY STRL (MISCELLANEOUS) ×3 IMPLANT
SLEVE PROBE SENORX GAMMA FIND (MISCELLANEOUS) ×3 IMPLANT
SOL PREP PVP 2OZ (MISCELLANEOUS) ×6
SOLUTION PREP PVP 2OZ (MISCELLANEOUS) ×4 IMPLANT
SPONGE LAP 18X18 RF (DISPOSABLE) ×6 IMPLANT
STAPLER SKIN PROX 35W (STAPLE) IMPLANT
SUT ETHILON 3-0 FS-10 30 BLK (SUTURE) ×3
SUT MNCRL 3-0 UNDYED SH (SUTURE) ×2 IMPLANT
SUT MNCRL 4-0 (SUTURE) ×6
SUT MNCRL 4-0 27XMFL (SUTURE) ×4
SUT MNCRL AB 4-0 PS2 18 (SUTURE) ×6 IMPLANT
SUT MNCRL+ 5-0 UNDYED PC-3 (SUTURE) ×4 IMPLANT
SUT MONOCRYL 3-0 UNDYED (SUTURE) ×1
SUT MONOCRYL 5-0 (SUTURE) ×2
SUT PDS PLUS 2 (SUTURE) ×6
SUT PDS PLUS AB 2-0 CT-1 (SUTURE) ×12 IMPLANT
SUT SILK 2 0 (SUTURE) ×3
SUT SILK 2 0 SH (SUTURE) ×3 IMPLANT
SUT SILK 2-0 18XBRD TIE 12 (SUTURE) ×2 IMPLANT
SUT SILK 3 0 SH 30 (SUTURE) ×3 IMPLANT
SUT SILK 4 0 SH (SUTURE) ×3 IMPLANT
SUT VIC AB 3-0 54X BRD REEL (SUTURE) IMPLANT
SUT VIC AB 3-0 BRD 54 (SUTURE)
SUT VIC AB 3-0 SH 27 (SUTURE)
SUT VIC AB 3-0 SH 27X BRD (SUTURE) IMPLANT
SUTURE EHLN 3-0 FS-10 30 BLK (SUTURE) ×2 IMPLANT
SUTURE MNCRL 4-0 27XMF (SUTURE) ×4 IMPLANT
SWABSTK COMLB BENZOIN TINCTURE (MISCELLANEOUS) IMPLANT
SYR 10ML LL (SYRINGE) ×9 IMPLANT
SYR 20ML LL LF (SYRINGE) ×3 IMPLANT
SYR 3ML LL SCALE MARK (SYRINGE) ×3 IMPLANT
SYR 5ML LL (SYRINGE) ×3 IMPLANT
SYR BULB IRRIG 60ML STRL (SYRINGE) ×6 IMPLANT
TOWEL OR 17X26 4PK STRL BLUE (TOWEL DISPOSABLE) ×3 IMPLANT
WATER STERILE IRR 1000ML POUR (IV SOLUTION) ×3 IMPLANT

## 2021-02-10 NOTE — Anesthesia Postprocedure Evaluation (Signed)
Anesthesia Post Note  Patient: Judy Padilla  Procedure(s) Performed: SIMPLE MASTECTOMY WITH AXILLARY SENTINEL NODE BIOPSY (Left Breast) INSERTION PORT-A-CATH (Right Chest) IMMEDIATE LEFT BREAST RECONSTRUCTION WITH PLACEMENT OF TISSUE EXPANDER AND FLEX HD (ACELLULAR HYDRATED DERMIS) (Left Breast)  Patient location during evaluation: PACU Anesthesia Type: General Level of consciousness: awake and alert and oriented Pain management: pain level controlled Vital Signs Assessment: post-procedure vital signs reviewed and stable Respiratory status: spontaneous breathing, nonlabored ventilation and respiratory function stable Cardiovascular status: blood pressure returned to baseline and stable Postop Assessment: no signs of nausea or vomiting Anesthetic complications: no   No complications documented.   Last Vitals:  Vitals:   02/10/21 1345 02/10/21 1406  BP: 104/67 98/63  Pulse: 76 87  Resp: 16 16  Temp: (!) 36.3 C   SpO2: 95% 95%    Last Pain:  Vitals:   02/10/21 1406  TempSrc: Temporal  PainSc: 5                  Raymont Andreoni

## 2021-02-10 NOTE — Anesthesia Procedure Notes (Signed)
Procedure Name: LMA Insertion Date/Time: 02/10/2021 9:30 AM Performed by: Doreen Salvage, CRNA Pre-anesthesia Checklist: Patient identified, Patient being monitored, Timeout performed, Emergency Drugs available and Suction available Patient Re-evaluated:Patient Re-evaluated prior to induction Oxygen Delivery Method: Circle system utilized Preoxygenation: Pre-oxygenation with 100% oxygen Induction Type: IV induction Ventilation: Mask ventilation without difficulty LMA: LMA inserted LMA Size: 4.5 Tube type: Oral Number of attempts: 1 Placement Confirmation: positive ETCO2 and breath sounds checked- equal and bilateral Tube secured with: Tape Dental Injury: Teeth and Oropharynx as per pre-operative assessment

## 2021-02-10 NOTE — Anesthesia Procedure Notes (Deleted)
Performed by: Dierdra Salameh, CRNA       

## 2021-02-10 NOTE — Anesthesia Preprocedure Evaluation (Addendum)
Anesthesia Evaluation  Patient identified by MRN, date of birth, ID band Patient awake    Reviewed: Allergy & Precautions, H&P , NPO status , Patient's Chart, lab work & pertinent test results, reviewed documented beta blocker date and time   History of Anesthesia Complications Negative for: history of anesthetic complications  Airway Mallampati: III  TM Distance: >3 FB Neck ROM: full    Dental  (+) Dental Advidsory Given, Teeth Intact, Missing   Pulmonary neg shortness of breath, asthma (well under control since allergy shots) , neg recent URI,    Pulmonary exam normal breath sounds clear to auscultation       Cardiovascular Exercise Tolerance: Good hypertension, (-) angina(-) Past MI and (-) Cardiac Stents Normal cardiovascular exam(-) dysrhythmias (-) Valvular Problems/Murmurs Rhythm:regular Rate:Normal     Neuro/Psych negative neurological ROS  negative psych ROS   GI/Hepatic Neg liver ROS, GERD  ,  Endo/Other  negative endocrine ROS  Renal/GU negative Renal ROS  negative genitourinary   Musculoskeletal   Abdominal   Peds  Hematology negative hematology ROS (+)   Anesthesia Other Findings Past Medical History: No date: Asthma     Comment:  well controlled No date: Breast cancer ([facility]) No date: Family history of adverse reaction to anesthesia     Comment:  sister-hard time waking up No date: GERD (gastroesophageal reflux disease) No date: Hypertension   Reproductive/Obstetrics negative OB ROS                            Anesthesia Physical Anesthesia Plan  ASA: II  Anesthesia Plan: General   Post-op Pain Management:    Induction: Intravenous  PONV Risk Score and Plan: 3 and Ondansetron, Dexamethasone, Midazolam, Promethazine and Treatment may vary due to age or medical condition  Airway Management Planned: LMA  Additional Equipment:   Intra-op Plan:   Post-operative  Plan: Extubation in OR  Informed Consent: I have reviewed the patients History and Physical, chart, labs and discussed the procedure including the risks, benefits and alternatives for the proposed anesthesia with the patient or authorized representative who has indicated his/her understanding and acceptance.     Dental Advisory Given  Plan Discussed with: Anesthesiologist, CRNA and Surgeon  Anesthesia Plan Comments:         Anesthesia Quick Evaluation

## 2021-02-10 NOTE — Discharge Instructions (Addendum)
INSTRUCTIONS FOR AFTER SURGERY   You will likely have some questions about what to expect following your operation.  The following information will help you and your family understand what to expect when you are discharged from the hospital.  Following these guidelines will help ensure a smooth recovery and reduce risks of complications.  Postoperative instructions include information on: diet, wound care, medications and physical activity.  AFTER SURGERY Expect to go home after the procedure.  In some cases, you may need to spend one night in the hospital for observation.  DIET This surgery does not require a specific diet.  However, I have to mention that the healthier you eat the better your body can start healing. It is important to increasing your protein intake.  This means limiting the foods with added sugar.  Focus on fruits and vegetables and some meat. It is very important to drink water after your surgery.  If your urine is bright yellow, then it is concentrated, and you need to drink more water.  As a general rule after surgery, you should have 8 ounces of water every hour while awake.  If you find you are persistently nauseated or unable to take in liquids let us know.  NO TOBACCO USE or EXPOSURE.  This will slow your healing process and increase the risk of a wound.  WOUND CARE If you have a drain: Clean with baby wipes for 3-5 days and then you can shower.  If you have a binder you may remove it to shower and then put it back on. If you have steri-strips / tape directly attached to your skin leave them in place. It is OK to get these wet.  No baths, pools or hot tubs for two weeks. We close your incision to leave the smallest and best-looking scar. No ointment or creams on your incisions until given the go ahead.  Especially not Neosporin (Too many skin reactions with this one).  A few weeks after surgery you can use Mederma and start massaging the scar. We ask you to wear your binder or  sports bra for the first 6 weeks around the clock, including while sleeping. This provides added comfort and helps reduce the fluid accumulation at the surgery site.  ACTIVITY No heavy lifting until cleared by the doctor.  It is OK to walk and climb stairs. In fact, moving your legs is very important to decrease your risk of a blood clot.  It will also help keep you from getting deconditioned.  Every 1 to 2 hours get up and walk for 5 minutes. This will help with a quicker recovery back to normal.  Let pain be your guide so you don't do too much.  NO, you cannot do the spring cleaning and don't plan on taking care of anyone else.  This is your time for TLC.   WORK Everyone returns to work at different times. As a rough guide, most people take at least 1 - 2 weeks off prior to returning to work. If you need documentation for your job, bring the forms to your postoperative follow up visit.  DRIVING Arrange for someone to bring you home from the hospital.  You may be able to drive a few days after surgery but not while taking any narcotics or valium.  BOWEL MOVEMENTS Constipation can occur after anesthesia and while taking pain medication.  It is important to stay ahead for your comfort.  We recommend taking Milk of Magnesia (2 tablespoons; twice   a day) while taking the pain pills.  SEROMA This is fluid your body tried to put in the surgical site.  This is normal but if it creates excessive pain and swelling let us know.  It usually decreases in a few weeks.  MEDICATIONS and PAIN CONTROL At your preoperative visit for you history and physical you were given the following medications: 1. An antibiotic: Start this medication when you get home and take according to the instructions on the bottle. 2. Zofran 4 mg:  This is to treat nausea and vomiting.  You can take this every 6 hours as needed and only if needed. 3. Norco (hydrocodone/acetaminophen) 5/325 mg:  This is only to be used after you have  taken the motrin or the tylenol. Every 8 hours as needed. Over the counter Medication to take: 4. Ibuprofen (Motrin) 600 mg:  Take this every 6 hours.  If you have additional pain then take 500 mg of the tylenol.  Only take the Norco after you have tried these two. 5. Miralax or stool softener of choice: Take this according to the bottle if you take the [place] Call your surgeon's office if any of the following occur: . Fever 101 degrees F or greater . Excessive bleeding or fluid from the incision site. . Pain that increases over time without aid from the medications . Redness, warmth, or pus draining from incision sites . Persistent nausea or inability to take in liquids . Severe misshapen area that underwent the operation.   [facility] Plastic Surgery Specialist  What is the benefit of having a drain?  During surgery your tissue layers are separated.  This raw surface stimulates your body to fill the space with serous fluid.  This is normal but you don't want that fluid to collect and prevent healing.  A fluid collection can also become infected.  The Jackson-Pratt (JP) drain is used to eliminate this collection of fluid and allow the tissue to heal together.    Jackson-Pratt (JP) bulb    How to care for your drainage and suction unit at home Your drainage catheter will be connected to a collection device. The vacuum caused when the device is compressed allows drainage to collect in the device.    Wendee Copp your hands with soap and water before and after touching the system. . Empty the JP drain every 12 hours once you get home from your procedure. . Record the fluid amount on the record sheet included. . Start with stripping the drain tube to push the clots or excess fluid to the bulb.  Do this by pinching the tube with one hand near your skin.  Then with the other hand squeeze the tubing and work it toward the bulb.  This should be done several times a day.  This may collapse the  tube which will correct on its own.   . Use a safety pin to attach your collection device to your clothing so there is no tension on the insertion site.   . If you have drainage at the skin insertion site, you can apply a gauze dressing and secure it with tape. . If the drain falls out, apply a gauze dressing over the drain insertion site and secure with tape.   To empty the collection device:   . Release the stopper on the top of the collection unit (bulb).  Signa Kell contents into a measuring container such as a plastic medicine cup.  . Record  the day and amount of drainage on the attached sheet. . This should be done at least twice a day.    To compress the Jackson-Pratt Bulb:  . Release the stopper at the top of the bulb. Marland Kitchen Squeeze the bulb tightly in your fist, squeezing air out of the bulb.  . Replace the stopper while the bulb is compressed.  . Be careful not to spill the contents when squeezing the bulb. . The drainage will start bright red and turn to pink and then yellow with time. . IMPORTANT: If the bulb is not squeezed before adding the stopper it will not draw out the fluid.  Care for the JP drain site and your skin daily:  . You may shower three days after surgery. . Secure the drain to a ribbon or cloth around your waist while showering so it does not pull out while showering. . Be sure your hands are cleaned with soap and water. . Use a clean wet cotton swab to clean the skin around the drain site.  . Use another cotton swab to place Vaseline or antibiotic ointment on the skin around the drain.     Contact your physician if any of the following occur:  Marland Kitchen The fluid in the bulb becomes cloudy. . Your temperature is greater than 101.4.  Marland Kitchen The incision opens. . If you have drainage at the skin insertion site, you can apply a gauze dressing and secure it with tape. . If the drain falls out, apply a gauze dressing over the drain insertion site and secure with tape.  . You will  usually have more drainage when you are active than while you rest or are asleep. If the drainage increases significantly or is bloody call the physician                             Bring this record with you to each office visit Date  Drainage Volume  Date   Drainage volume                                                                                                                                                                                         AMBULATORY SURGERY  DISCHARGE INSTRUCTIONS   1) The drugs that you were given will stay in your system until tomorrow so for the next 24 hours you should not:  A) Drive an automobile B) Make any legal decisions C) Drink any alcoholic beverage   2) You may resume regular meals tomorrow.  Today it is better to start with liquids and gradually work up  to solid foods.  You may eat anything you prefer, but it is better to start with liquids, then soup and crackers, and gradually work up to solid foods.   3) Please notify your doctor immediately if you have any unusual bleeding, trouble breathing, redness and pain at the surgery site, drainage, fever, or pain not relieved by medication.    4) Additional Instructions:   PAIN MED (OXYCODONE) GIVEN AT HOSPITAL AT 2:10 PM. TAKE MEDS AS PRESCRIBED PRIOR TO DAY OF SURGERY.        Please contact your physician with any problems or Same Day Surgery at [phone], Monday through Friday 6 am to 4 pm, or [place] at [facility] number at [phone].

## 2021-02-10 NOTE — Op Note (Signed)
  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  Post-operative Diagnosis: Same.  Procedure: Left simple mastectomy and sentinel lymph node biopsy  Surgeon:  Ronny Bacon, M.D., [facility]  Anesthesia:  General endotracheal  Estimated Blood Loss: 100 ml  Specimens: Left breast tissue, apparent single sentinel lymph node.  Complications: None.  Indications for Procedure:  This is a 55 y.o. female who presents with the above.  The risks of bleeding, infection, injury to surrounding structures, hematoma, seroma, open wound, cosmetic deformity, and the need for further surgery were all discussed with the patient and was willing to proceed.  Prior to this procedure, the patient had undergone sentinel lymphoscintigraphy.  Description of Procedure: The patient was correctly identified in the preoperative area and brought into the operating room.  The patient was placed supine with VTE prophylaxis in place.  Appropriate time-outs were performed.  Anesthesia was induced and the patient was intubated.  Appropriate antibiotics were infused.  A visual dye was injected in the left periareolar region under aseptic conditions, and was left, prior to the Port-A-Cath placement.  After the Port-A-Cath was completed, the left chest and axilla were prepped and draped in usual sterile fashion.   An elliptical incision was then copmpleted over the breast encompassing the nipple-areolar complex.  Using electrosurgery, subcutaneous flaps were created superiorly to the clavicle, inferiorly to the inframammary fold, medially to the sternum, and laterally to the latissimus dorsi with careful attention to create flaps of adequate thickness.  The breast tissue was then dissected with the pectoralis fascia as the deep margin.  2-0 silk suture was used to mark the specimen as short superior and long lateral.  The cavity was  then irrigated and hemostasis was assured with electrocautery.   Then using the hand-held probe an area of high count was identified in the axilla, blunt dissection with judicious cautery was used to dissect down the past the pectoral fascia into the axilla and the hand-held probe was used to guide dissection. A normal-appearing and 5000 count lymph node was identified and resected.  This had a count of over 5000.  No additional lymph nodes were identified with background counts of less than 200.  The wound bed had a count of less than 200.  The cavity was irrigated and hemostasis was assured with electrocautery.    The remaining procedure was then turned over to Dr. Audelia Hives for the immediate reconstruction as planned.  The patient tolerated the procedure well and all counts were correct at the transition.  Sentinel Node Biopsy Synoptic Operative Report  Operation performed with curative intent:Yes  Tracer(s) used to identify sentinel nodes in the upfront surgery (non-neoadjuvant) setting (select all that apply):Dye and Radioactive Tracer  Tracer(s) used to identify sentinel nodes in the neoadjuvant setting (select all that apply):N/A  All nodes (colored or non-colored) present at the end of a dye-filled lymphatic channel were removed:No: No colored lymphatics were identified within the axilla.   All significantly radioactive nodes were removed:Yes  All palpable suspicious nodes were removed:Yes  Biopsy-proven positive nodes marked with clips prior to chemotherapy were identified and removed:N/A    Ronny Bacon M.D., [facility] 02/10/2021, 12:19 PM

## 2021-02-10 NOTE — Op Note (Signed)
SURGICAL PROCEDURE REPORT  DATE OF PROCEDURE: 02/10/2021   ATTENDING SURGEON:  Ronny Bacon, M.D., FACS   ANESTHESIA: General LMA  PRE-OPERATIVE DIAGNOSIS: Left breast HER-2 positive cancer requiring durable central venous access for therapy  POST-OPERATIVE DIAGNOSIS: Same.  PROCEDURE: (cpt: 29798, 92119)  Insertion of right subclavian vein Bard PowerPort central venous catheter with subcutaneous port under fluoroscopic guidance   INTRAOPERATIVE FINDINGS: Fluoroscopic confirmation of guidewire in superior vena cava, tip at fluoroscopy appears to be at caval atrial junction, excellent return and easy flush, well-secured tunneled central venous catheter with subcutaneous port at completion of the procedure   FLUOROSCOPY: as recorded.   ESTIMATED BLOOD LOSS: 251m   SPECIMENS: None   IMPLANTS: 66F tunneled Bard PowerPort central venous catheter with subcutaneous port  DRAINS: None   COMPLICATIONS: None apparent, catheter meeting excess resistance while passing it through the breakaway introducer.  After further manipulation which resulted in 250 mL blood loss, we were able to pass the catheter via the breakaway introducer and complete the procedure without further event.  CONDITION AT COMPLETION: Hemodynamically stable, awake   DISPOSITION: PACU   INDICATION(S) FOR PROCEDURE:  Patient is a 55y.o. female who presented with HER-2 positive left breast cancer requiring durable central venous access for therapy. All risks, benefits, and alternatives to above elective procedures were discussed with the patient, who elected to proceed, and informed consent was accordingly obtained at that time.  DETAILS OF PROCEDURE:  Patient was brought to the operative suite and appropriately identified. Right subclavian access site and planned port placement site were prepped and draped in the usual sterile fashion. Following a brief timeout, in Trendelenburg position, percutaneous Right subclavian  venous access was obtained using Seldinger technique, by which local anesthetic was injected over the right subclavian region, and access needle was inserted into the SCV vein, through which soft guidewire was advanced, over which access needle was withdrawn. Length of catheter needed to position the catheter tip at the atrio-caval junction was then measured under direct fluoroscopic visualization, after which the catheter was cut to the measured length. Guidewire was secured, attention was directed to injection of local anesthetic along the planned port site, 2-3 cm transverse ipsilateral chest incision was made and confirmed to accommodate the subcutaneous port, and flushed measured catheter was attached to port, then the port was placed within the subcutaneous pocket. Insertion sheath was advanced over the guidewire, which was withdrawn along with the insertion sheath dilator.  The catheter was then advanced through the sheath first, meeting some resistance for the first time.  I then manipulated by reinserting the guidewire and the dilator, again with no resistance under fluoroscopic guidance.  I then withdrew both the guidewire and the dilator, adjusted the breakaway introducer and was able to pass the catheter into the SCV and SVC, without any further resistance.  Under fluoroscopy it appeared to be in the superior vena cava without kink or unusual twist or bend.  Port was confirmed to withdraw blood and flush easily, after which concentrated heparin was instilled into the port and catheter. Dermis at the subcutaneous pocket was re-approximated using buried interrupted 3-0 Vicryl suture, and 4-0 Monocryl suture was used to re-approximate skin at the insertion/subcutaneous port site in running subcuticular fashion for the subcutaneous port and buried interrupted fashion for the insertion site. Skin was cleaned, dried, and sterile skin glue was applied. Patient was then safely transferred to PACU for a chest  x-ray.  I was present for all aspects of the  procedures, and no intraprocedural complications were apparent.   Ronny Bacon, M.D., [facility] 02/10/2021 12:14 PM

## 2021-02-10 NOTE — Interval H&P Note (Signed)
History and Physical Interval Note:  02/10/2021 8:46 AM  Judy Padilla  has presented today for surgery, with the diagnosis of Left breast cancer.  The various methods of treatment have been discussed with the patient and family. After consideration of risks, benefits and other options for treatment, the patient has consented to  Procedure(s): SIMPLE MASTECTOMY WITH AXILLARY SENTINEL NODE BIOPSY (Left) INSERTION PORT-A-CATH (N/A) IMMEDIATE LEFT BREAST RECONSTRUCTION WITH PLACEMENT OF TISSUE EXPANDER AND FLEX HD (ACELLULAR HYDRATED DERMIS) (Left) as a surgical intervention.  The patient's history has been reviewed, patient examined, no change in status, stable for surgery.  I have reviewed the patient's chart and labs.  Questions were answered to the patient's satisfaction.     Ronny Bacon

## 2021-02-10 NOTE — Op Note (Signed)
Op report    DATE OF OPERATION:  02/10/2021  LOCATION: [facility]  SURGICAL DIVISION: Plastic Surgery  PREOPERATIVE DIAGNOSES:  1. Left Breast cancer.    POSTOPERATIVE DIAGNOSES:  1. Left Breast cancer.   PROCEDURE:  1. Left immediate breast reconstruction with placement of Acellular Dermal Matrix and tissue expanders.  SURGEON: Floy Angert Sanger Rebeckah Masih, DO  ANESTHESIA:  General.   COMPLICATIONS: None.   IMPLANTS: Left - Mentor 535 cc. Ref #[id number].  150 cc of injectable saline placed in the expander. Acellular Dermal Matrix 6 x 16 cm  INDICATIONS FOR PROCEDURE:  The patient, Judy Padilla, is a 55 y.o. female born on June 02, 1966, is here for  immediate first stage breast reconstruction with placement of a left tissue expander and Acellular dermal matrix. MRN: [id number]  CONSENT:  Informed consent was obtained directly from the patient. Risks, benefits and alternatives were fully discussed. Specific risks including but not limited to bleeding, infection, hematoma, seroma, scarring, pain, implant infection, implant extrusion, capsular contracture, asymmetry, wound healing problems, and need for further surgery were all discussed. The patient did have an ample opportunity to have her questions answered to her satisfaction.   DESCRIPTION OF PROCEDURE:  The patient was taken to the operating room by the general surgery team. SCDs were placed and IV antibiotics were given. The patient's chest was prepped and draped in a sterile fashion. A time out was performed and the implants to be used were identified.  A Left mastectomy was performed.  Once the general surgery team had completed their portion of the case the patient was rendered to the plastic and reconstructive surgery team.  Left:  The pectoralis major muscle was lifted from the chest wall with release of the lateral edge and lateral inframammary fold.  The pocket was irrigated with antibiotic solution and  hemostasis was achieved with electrocautery.  The ADM was then prepared according to the manufacture guidelines and slits placed to help with postoperative fluid management.  The ADM was then sutured to the inferior and lateral edge of the inframammary fold with 2-0 PDS starting with an interrupted stitch and then a running stitch.  The lateral portion was sutured to with interrupted sutures after the expander was placed.  The expander was prepared according to the manufacture guidelines, the air evacuated and then it was placed under the ADM and pectoralis major muscle.  The inferior and lateral tabs were used to secure the expander to the chest wall with 2-0 PDS.  The drain was placed at the inframammary fold over the ADM and secured to the skin with 3-0 Silk.    The deep layers were closed with 3-0 Monocryl followed by 4-0 and 5-0 Monocryl.  The skin was closed with 5-0 Monocryl and then dermabond was applied.  The ABDs and breast binder were placed.  The patient tolerated the procedure well and there were no complications.  The patient was allowed to wake from anesthesia and taken to the recovery room in satisfactory condition.

## 2021-02-10 NOTE — Transfer of Care (Signed)
Immediate Anesthesia Transfer of Care Note  Patient: Judy Padilla  Procedure(s) Performed: SIMPLE MASTECTOMY WITH AXILLARY SENTINEL NODE BIOPSY (Left Breast) INSERTION PORT-A-CATH (Right Chest) IMMEDIATE LEFT BREAST RECONSTRUCTION WITH PLACEMENT OF TISSUE EXPANDER AND FLEX HD (ACELLULAR HYDRATED DERMIS) (Left Breast)  Patient Location: PACU  Anesthesia Type:General  Level of Consciousness: drowsy and patient cooperative  Airway & Oxygen Therapy: Patient Spontanous Breathing and Patient connected to face mask  Post-op Assessment: Report given to RN and Post -op Vital signs reviewed and stable  Post vital signs: Reviewed and stable  Last Vitals:  Vitals Value Taken Time  BP 105/70 02/10/21 1253  Temp    Pulse 90 02/10/21 1255  Resp 15 02/10/21 1254  SpO2 100 % 02/10/21 1255  Vitals shown include unvalidated device data.  Last Pain:  Vitals:   02/10/21 0828  PainSc: 0-No pain         Complications: No complications documented.

## 2021-02-10 NOTE — Interval H&P Note (Signed)
History and Physical Interval Note:  02/10/2021 8:49 AM  Judy Padilla  has presented today for surgery, with the diagnosis of Left breast cancer.  The various methods of treatment have been discussed with the patient and family. After consideration of risks, benefits and other options for treatment, the patient has consented to  Procedure(s): SIMPLE MASTECTOMY WITH AXILLARY SENTINEL NODE BIOPSY (Left) INSERTION PORT-A-CATH (N/A) IMMEDIATE LEFT BREAST RECONSTRUCTION WITH PLACEMENT OF TISSUE EXPANDER AND FLEX HD (ACELLULAR HYDRATED DERMIS) (Left) as a surgical intervention.  The patient's history has been reviewed, patient examined, no change in status, stable for surgery.  I have reviewed the patient's chart and labs.  Questions were answered to the patient's satisfaction.     [name]

## 2021-02-11 ENCOUNTER — Encounter: Payer: Self-pay | Admitting: Surgery

## 2021-02-11 NOTE — Progress Notes (Signed)
Patient states she is taking over the counter medication for pain management because she is scared of narcotics.  This RN giving educational support surrounding the use of narcotics.  Suggestions for where to find educational material on narcotic use.  Patient advised to call surgeons office for reiteration of pain management for further instruction. Patient states pain is "tolerable" with just using OTC remedies.

## 2021-02-13 LAB — SURGICAL PATHOLOGY

## 2021-02-18 ENCOUNTER — Encounter: Payer: Self-pay | Admitting: Plastic Surgery

## 2021-02-18 ENCOUNTER — Other Ambulatory Visit: Payer: Self-pay

## 2021-02-18 ENCOUNTER — Ambulatory Visit (INDEPENDENT_AMBULATORY_CARE_PROVIDER_SITE_OTHER): Payer: No Typology Code available for payment source | Admitting: Plastic Surgery

## 2021-02-18 VITALS — BP 120/81 | HR 81

## 2021-02-18 DIAGNOSIS — D4862 Neoplasm of uncertain behavior of left breast: Secondary | ICD-10-CM

## 2021-02-18 NOTE — Progress Notes (Signed)
The patient is a 55 year old female here for follow-up after undergoing a left breast mastectomy with immediate reconstruction.  She has a 535 cc expander in place with 150 cc.  She tolerated expansion well.  The drain outputs been minimal and we will plan on removing the drain next week.  We placed injectable saline in the Expander using a sterile technique: Left: 50 cc for a total of 200 / 535 cc

## 2021-02-20 DIAGNOSIS — J301 Allergic rhinitis due to pollen: Secondary | ICD-10-CM | POA: Diagnosis not present

## 2021-02-24 ENCOUNTER — Telehealth: Payer: Self-pay | Admitting: Internal Medicine

## 2021-02-24 NOTE — Telephone Encounter (Signed)
C- please schedule appt- this week- MD; no labs- Thanks, GB

## 2021-02-25 ENCOUNTER — Other Ambulatory Visit: Payer: Self-pay

## 2021-02-25 ENCOUNTER — Encounter: Payer: Self-pay | Admitting: Surgical

## 2021-02-25 ENCOUNTER — Ambulatory Visit (INDEPENDENT_AMBULATORY_CARE_PROVIDER_SITE_OTHER): Payer: No Typology Code available for payment source | Admitting: Surgical

## 2021-02-25 VITALS — BP 132/89 | HR 77

## 2021-02-25 DIAGNOSIS — D4862 Neoplasm of uncertain behavior of left breast: Secondary | ICD-10-CM

## 2021-02-25 DIAGNOSIS — Z170 Estrogen receptor positive status [ER+]: Secondary | ICD-10-CM

## 2021-02-25 DIAGNOSIS — C50512 Malignant neoplasm of lower-outer quadrant of left female breast: Secondary | ICD-10-CM

## 2021-02-25 NOTE — Progress Notes (Signed)
Patient is a 55 year old female here for follow-up after undergoing left breast mastectomy followed by immediate breast reconstruction placement tissue expander with Dr. Marla Roe.  She is 2 weeks postop.  She reports overall she is doing well.  She has occasional pain but she reports that overall it is manageable.  She reports that she has been using Valium as needed.  She reports minimal output from the drain, approximately 7 cc per 24 hours.  Chaperone present on exam On exam left breast incision appears intact.  Steri-Strips and honeycomb dressing still in place.  No erythema noted.  JP drain with less than 3 cc of serosanguineous drainage in bulb.    We placed injectable saline in the Expander using a sterile technique: Left: 50 cc for a total of 200/535 cc  Continue to avoid strenuous activities.  Continue to wear compressive garment 24/7.  Recommend following up in 2 weeks for reevaluation.  Call with questions or concerns.

## 2021-02-26 ENCOUNTER — Inpatient Hospital Stay: Payer: No Typology Code available for payment source | Attending: Internal Medicine | Admitting: Internal Medicine

## 2021-02-26 ENCOUNTER — Encounter: Payer: Self-pay | Admitting: Internal Medicine

## 2021-02-26 VITALS — BP 123/85 | HR 84 | Temp 97.2°F | Resp 16 | Ht 68.0 in | Wt 194.0 lb

## 2021-02-26 DIAGNOSIS — Z170 Estrogen receptor positive status [ER+]: Secondary | ICD-10-CM | POA: Diagnosis not present

## 2021-02-26 DIAGNOSIS — J301 Allergic rhinitis due to pollen: Secondary | ICD-10-CM | POA: Diagnosis not present

## 2021-02-26 DIAGNOSIS — Z9012 Acquired absence of left breast and nipple: Secondary | ICD-10-CM | POA: Insufficient documentation

## 2021-02-26 DIAGNOSIS — Z9221 Personal history of antineoplastic chemotherapy: Secondary | ICD-10-CM | POA: Diagnosis not present

## 2021-02-26 DIAGNOSIS — C50512 Malignant neoplasm of lower-outer quadrant of left female breast: Secondary | ICD-10-CM | POA: Diagnosis not present

## 2021-02-26 MED ORDER — LIDOCAINE-PRILOCAINE 2.5-2.5 % EX CREA: TOPICAL_CREAM | CUTANEOUS | 0 refills | Status: DC

## 2021-02-26 MED ORDER — PROCHLORPERAZINE MALEATE 10 MG PO TABS: 10.0000 mg | ORAL_TABLET | Freq: Four times a day (QID) | ORAL | 1 refills | Status: DC | PRN

## 2021-02-26 MED ORDER — ONDANSETRON HCL 8 MG PO TABS: ORAL_TABLET | ORAL | 1 refills | Status: DC

## 2021-02-26 NOTE — Progress Notes (Signed)
Mastectomy on left breast on 4/4.

## 2021-02-26 NOTE — Progress Notes (Signed)
[facility] NOTE  Patient Care Team: [name], MD as PCP - General (Internal Medicine) [name], RN as Registered Nurse  CHIEF COMPLAINTS/PURPOSE OF CONSULTATION: Breast cancer    Oncology History Overview Note  # April 2022-stage Ia mammary carcinoma ER/PR positive HER2 positive [TRIPLE positive]; negative margins s/p simple mastectomy with plan for immediate reconstruction [Dr.Rodenberg/[name]]; NO RT  # May 2nd, 2022- Taxol-Herceptin  # LMP- mid 2020; Uterine ablation- [phone]; # HTN; Asthma- well controlled [on allergy shots];Marland Kitchen    Malignant neoplasm of lower-outer quadrant of left breast of female, estrogen receptor positive ([place])  12/25/2020 Initial Diagnosis   Malignant neoplasm of lower-outer quadrant of left breast of female, estrogen receptor positive ([place])   02/26/2021 Cancer Staging   Staging form: Breast, AJCC 8th Edition - Pathologic: Stage IA (pT1c, pN0, cM0, G3, ER+, PR+, HER2+) - Signed by Cammie Sickle, MD on 02/26/2021 Mitotic count score: Score 3 Histologic grading system: 3 grade system   02/26/2021 -  Chemotherapy    Patient is on Treatment Plan: BREAST PACLITAXEL + TRASTUZUMAB Q7D / TRASTUZUMAB Q21D         HISTORY OF PRESENTING ILLNESS:  Judy Padilla 55 y.o.  female newly diagnosed breast cancer is here for follow-up for surgery.  Patient had a simple mastectomy; currently has saline expanders in place.   Denies any significant pain.  Denies any nausea vomiting.  Patient had her drain taken out yesterday.   Review of Systems  Constitutional: Negative for chills, diaphoresis, fever, malaise/fatigue and weight loss.  HENT: Negative for nosebleeds and sore throat.   Eyes: Negative for double vision.  Respiratory: Negative for cough, hemoptysis, sputum production, shortness of breath and wheezing.   Cardiovascular: Negative for chest pain, palpitations, orthopnea and leg swelling.  Gastrointestinal:  Negative for abdominal pain, blood in stool, constipation, diarrhea, heartburn, melena, nausea and vomiting.  Genitourinary: Negative for dysuria, frequency and urgency.  Musculoskeletal: Negative for back pain and joint pain.  Skin: Negative.  Negative for itching and rash.  Neurological: Negative for dizziness, tingling, focal weakness, weakness and headaches.  Endo/Heme/Allergies: Does not bruise/bleed easily.  Psychiatric/Behavioral: Negative for depression. The patient is not nervous/anxious and does not have insomnia.      MEDICAL HISTORY:  Past Medical History:  Diagnosis Date  . Asthma    well controlled  . Breast cancer ([place])   . Family history of adverse reaction to anesthesia    sister-hard time waking up  . GERD (gastroesophageal reflux disease)   . Hypertension     SURGICAL HISTORY: Past Surgical History:  Procedure Laterality Date  . ABLATION    . BREAST BIOPSY Left 2011   Benign per pt  . BREAST BIOPSY Left 12/12/2020   3:30 3 cmfn, Q marker, path pending  . BREAST BIOPSY Left 12/12/2020   3:30 1 cmfn, Vision marker, path pending  . BREAST RECONSTRUCTION WITH PLACEMENT OF TISSUE EXPANDER AND FLEX HD (ACELLULAR HYDRATED DERMIS) Left 02/10/2021   Procedure: IMMEDIATE LEFT BREAST RECONSTRUCTION WITH PLACEMENT OF TISSUE EXPANDER AND FLEX HD (ACELLULAR HYDRATED DERMIS);  Surgeon: Wallace Going, DO;  Location: [facility] ORS;  Service: Plastics;  Laterality: Left;  . COLONOSCOPY  01/15/2020  . PORTACATH PLACEMENT Right 02/10/2021   Procedure: INSERTION PORT-A-CATH;  Surgeon: Ronny Bacon, MD;  Location: [facility] ORS;  Service: General;  Laterality: Right;  . SIMPLE MASTECTOMY WITH AXILLARY SENTINEL NODE BIOPSY Left 02/10/2021   Procedure: SIMPLE MASTECTOMY WITH AXILLARY SENTINEL NODE BIOPSY;  Surgeon: Ronny Bacon, MD;  Location: [facility] ORS;  Service: General;  Laterality: Left;    SOCIAL HISTORY: Social History   Socioeconomic History  . Marital status: Married     Spouse name: Not on file  . Number of children: Not on file  . Years of education: Not on file  . Highest education level: Not on file  Occupational History  . Not on file  Tobacco Use  . Smoking status: Never Smoker  . Smokeless tobacco: Never Used  Vaping Use  . Vaping Use: Never used  Substance and Sexual Activity  . Alcohol use: Not Currently  . Drug use: Never  . Sexual activity: Not on file  Other Topics Concern  . Not on file  Social History Narrative   Lives in [place] with husband; kids- college. Works for [employer]- working from home. No smoking or alcohol.    Social Determinants of Health   Financial Resource Strain: Not on file  Food Insecurity: Not on file  Transportation Needs: Not on file  Physical Activity: Not on file  Stress: Not on file  Social Connections: Not on file  Intimate Partner Violence: Not on file    FAMILY HISTORY: Family History  Problem Relation Age of Onset  . Hypertension Mother   . Diabetes Mother   . Hypertension Father   . Diabetes Father   . Cancer Father        prostate cancer-70s  . Cancer Maternal Grandmother        uterine cancer  . Breast cancer Sister        in in 18s.     ALLERGIES:  is allergic to shrimp extract allergy skin test and other.  MEDICATIONS:  Current Outpatient Medications  Medication Sig Dispense Refill  . albuterol (VENTOLIN HFA) 108 (90 Base) MCG/ACT inhaler Inhale 1 puff into the lungs every 6 (six) hours as needed for shortness of breath.    Marland Kitchen amLODipine (NORVASC) 10 MG tablet Take 10 mg by mouth every morning.    Marland Kitchen atorvastatin (LIPITOR) 80 MG tablet Take 80 mg by mouth every morning.    . budesonide (PULMICORT) 0.5 MG/2ML nebulizer solution Take 2 mLs by nebulization daily.    . cetirizine (ZYRTEC) 10 MG tablet Take 10 mg by mouth daily as needed for allergies.    . Cholecalciferol 1.25 MG (50000 UT) capsule Take 50,000 Units by mouth once a week.    . diazepam (VALIUM) 2  MG tablet Take 1 tablet (2 mg total) by mouth every 12 (twelve) hours as needed for muscle spasms. 20 tablet 0  . EPINEPHrine 0.3 mg/0.3 mL IJ SOAJ injection Inject 0.3 mg into the muscle as needed for anaphylaxis.    . fluticasone (FLONASE) 50 MCG/ACT nasal spray Place 1 spray into both nostrils daily as needed for allergies.    . fluticasone furoate-vilanterol (BREO ELLIPTA) 100-25 MCG/INH AEPB Inhale 1 puff into the lungs as needed for shortness of breath.    . GNP BUDESONIDE NASAL SPRAY NA Place 1 spray into the nose daily.    Marland Kitchen lidocaine-prilocaine (EMLA) cream Apply 30 -45 mins prior to port access. 30 g 0  . Multiple Vitamins-Minerals (CENTRUM ADULTS) TABS Take 1 tablet by mouth daily.    . NON FORMULARY Takes weekly allergy shots    . ondansetron (ZOFRAN) 8 MG tablet One pill every 8 hours as needed for nausea/vomitting. 40 tablet 1  . pantoprazole (PROTONIX) 40 MG tablet Take 40 mg by mouth every morning.    Marland Kitchen  prochlorperazine (COMPAZINE) 10 MG tablet Take 1 tablet (10 mg total) by mouth every 6 (six) hours as needed for nausea or vomiting. 40 tablet 1  . naproxen sodium (ALEVE) 220 MG tablet Take 220 mg by mouth daily as needed (knee pain). (Patient not taking: Reported on 02/26/2021)    . ondansetron (ZOFRAN) 4 MG tablet Take 1 tablet (4 mg total) by mouth every 8 (eight) hours as needed for nausea or vomiting. (Patient not taking: No sig reported) 20 tablet 0   No current facility-administered medications for this visit.      Marland Kitchen  PHYSICAL EXAMINATION: ECOG PERFORMANCE STATUS: 0 - Asymptomatic  Vitals:   02/26/21 0954  BP: 123/85  Pulse: 84  Resp: 16  Temp: (!) 97.2 F (36.2 C)  SpO2: 100%   Filed Weights   02/26/21 0954  Weight: 194 lb (88 kg)    Physical Exam Constitutional:      Comments: Alone; ambulating independently.  HENT:     Head: Normocephalic and atraumatic.     Mouth/Throat:     Pharynx: No oropharyngeal exudate.  Eyes:     Pupils: Pupils are equal,  round, and reactive to light.  Cardiovascular:     Rate and Rhythm: Normal rate and regular rhythm.  Pulmonary:     Effort: Pulmonary effort is normal. No respiratory distress.     Breath sounds: Normal breath sounds. No wheezing.  Abdominal:     General: Bowel sounds are normal. There is no distension.     Palpations: Abdomen is soft. There is no mass.     Tenderness: There is no abdominal tenderness. There is no guarding or rebound.  Musculoskeletal:        General: No tenderness. Normal range of motion.     Cervical back: Normal range of motion and neck supple.  Skin:    General: Skin is warm.  Neurological:     Mental Status: She is alert and oriented to person, place, and time.  Psychiatric:        Mood and Affect: Affect normal.      LABORATORY DATA:  I have reviewed the data as listed Lab Results  Component Value Date   WBC 5.8 02/04/2021   HGB 13.4 02/04/2021   HCT 41.2 02/04/2021   MCV 82.4 02/04/2021   PLT 257 02/04/2021   Recent Labs    02/04/21 1110  NA 138  K 4.1  CL 107  CO2 24  GLUCOSE 99  BUN 22*  CREATININE 0.90  CALCIUM 9.4  GFRNONAA >60  PROT 7.2  ALBUMIN 4.3  AST 22  ALT 28  ALKPHOS 83  BILITOT 0.9    RADIOGRAPHIC STUDIES: I have personally reviewed the radiological images as listed and agreed with the findings in the report. NM Sentinel Node Inj-No Rpt (Breast)  Result Date: 02/10/2021 Sulfur colloid was injected by the nuclear medicine technologist for melanoma sentinel node.   MM BREAST SURGICAL SPECIMEN  Result Date: 02/10/2021 CLINICAL DATA:  Specimen radiograph of the left breast following excisional biopsy. EXAM: SPECIMEN RADIOGRAPH OF THE LEFT BREAST COMPARISON:  Previous exam(s). FINDINGS: Status post excision of the left breast. Specimen radiograph shows there is a spiral, vision and Q shaped clip in the surgical specimen. IMPRESSION: Specimen radiograph of the left breast. Electronically Signed   By: Lillia Mountain M.D.   On:  02/10/2021 15:18   DG CHEST PORT 1 VIEW  Result Date: 02/10/2021 CLINICAL DATA:  Port-A-Cath plate splint EXAM: PORTABLE CHEST 1  VIEW COMPARISON:  None. FINDINGS: Port-A-Cath tip is at the cavoatrial junction. No pneumothorax. Lungs are clear. Heart is borderline enlarged with pulmonary vascularity normal. No adenopathy. Breast tissue expander noted on the left. No bone lesions. IMPRESSION: Port-A-Cath tip at cavoatrial junction. No pneumothorax. Lungs clear. Heart borderline prominent. Tissue expander left breast. Electronically Signed   By: Lowella Grip III M.D.   On: 02/10/2021 15:16   DG C-Arm 1-60 Min-No Report  Result Date: 02/10/2021 Fluoroscopy was utilized by the requesting physician.  No radiographic interpretation.    ASSESSMENT & PLAN:   Malignant neoplasm of lower-outer quadrant of left breast of female, estrogen receptor positive ([place]) # Left breast- CA- s/p mastectomy-  pT1c pN0 [Stage IA]Grade 3.  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  I reviewed the pathology and staging with patient detail.  #Given the mastectomy no role for radiation.  Recommend adjuvant chemotherapy with Taxol Herceptin weekly x12 followed by total of 1 year of maintenance Herceptin.  Discussed that patient has significant chance of cure with the current adjuvant therapy.   Discussed the potential side effects including but not limited to-increasing fatigue, nausea vomiting, diarrhea, hair loss, sores in the mouth, increase risk of infection and also neuropathy.  Discussed the role of Herceptin; and also the risk of cardiotoxicity which is fortunately reversible.  Recommend a MUGA scan at baseline; and then every 3 to 4 months.  # Chemotherapy education; port placed; Antiemetics-Zofran and Compazine; EMLA cream sent to pharmacy   # Genetics: Patient meets the criteria for genetics; sister diagnosed with breast cancer.  Would recommend genetic counseling; will make referral.  # DISPOSITION: # genetics  referral re: breast cancer # chemo education- Taxol-herceptin # MUGA scan asap # follow up on may 2nd- MD labs- cbc/cmp;Taxol-Herceptin-wekly.   Dr.B   All questions were answered. The patient/family knows to call the clinic with any problems, questions or concerns.    Cammie Sickle, MD 02/26/2021 1:11 PM

## 2021-02-26 NOTE — Assessment & Plan Note (Addendum)
#  Left breast- CA- s/p mastectomy-  pT1c pN0 [Stage IA]Grade 3.  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  I reviewed the pathology and staging with patient detail.  #Given the mastectomy no role for radiation.  Recommend adjuvant chemotherapy with Taxol Herceptin weekly x12 followed by total of 1 year of maintenance Herceptin.  Discussed that patient has significant chance of cure with the current adjuvant therapy.   Discussed the potential side effects including but not limited to-increasing fatigue, nausea vomiting, diarrhea, hair loss, sores in the mouth, increase risk of infection and also neuropathy.  Discussed the role of Herceptin; and also the risk of cardiotoxicity which is fortunately reversible.  Recommend a MUGA scan at baseline; and then every 3 to 4 months.  # Chemotherapy education; port placed; Antiemetics-Zofran and Compazine; EMLA cream sent to pharmacy   # Genetics: Patient meets the criteria for genetics; sister diagnosed with breast cancer.  Would recommend genetic counseling; will make referral.  # DISPOSITION: # genetics referral re: breast cancer # chemo education- Taxol-herceptin # MUGA scan asap # follow up on may 2nd- MD labs- cbc/cmp;Taxol-Herceptin-wekly.   Dr.B

## 2021-02-26 NOTE — Progress Notes (Signed)
START ON PATHWAY REGIMEN - Breast ? ? ?  Cycle 1: A cycle is 7 days: ?    Trastuzumab-xxxx  ?    Paclitaxel  ?  Cycles 2 through 12: A cycle is every 7 days: ?    Trastuzumab-xxxx  ?    Paclitaxel  ?  Cycles 13 through 25: A cycle is every 21 days: ?    Trastuzumab-xxxx  ? ?**Always confirm dose/schedule in your pharmacy ordering system** ? ?Patient Characteristics: ?Postoperative without Neoadjuvant Therapy (Pathologic Staging), Invasive Disease, Adjuvant Therapy, HER2 Positive, ER Positive, Node Negative, pT1c, pN0/N1mi ?Therapeutic Status: Postoperative without Neoadjuvant Therapy (Pathologic Staging) ?AJCC Grade: G3 ?AJCC N Category: pN0 ?AJCC M Category: cM0 ?ER Status: Positive (+) ?AJCC 8 Stage Grouping: IA ?HER2 Status: Positive (+) ?Oncotype Dx Recurrence Score: Not Appropriate ?AJCC T Category: pT1c ?PR Status: Positive (+) ?Adjuvant Therapy Status: No Adjuvant Therapy Received Yet or Changing Initial Adjuvant Regimen due to Tolerance ?Intent of Therapy: ?Curative Intent, Discussed with Patient ?

## 2021-02-27 ENCOUNTER — Ambulatory Visit (INDEPENDENT_AMBULATORY_CARE_PROVIDER_SITE_OTHER): Payer: No Typology Code available for payment source | Admitting: Surgery

## 2021-02-27 ENCOUNTER — Other Ambulatory Visit: Payer: Self-pay

## 2021-02-27 ENCOUNTER — Encounter: Payer: Self-pay | Admitting: Surgery

## 2021-02-27 VITALS — BP 125/78 | HR 92 | Temp 98.9°F | Ht 68.0 in | Wt 193.6 lb

## 2021-02-27 DIAGNOSIS — Z170 Estrogen receptor positive status [ER+]: Secondary | ICD-10-CM

## 2021-02-27 DIAGNOSIS — J301 Allergic rhinitis due to pollen: Secondary | ICD-10-CM | POA: Diagnosis not present

## 2021-02-27 DIAGNOSIS — C50512 Malignant neoplasm of lower-outer quadrant of left female breast: Secondary | ICD-10-CM

## 2021-02-27 NOTE — Progress Notes (Signed)
[facility] POST-OP OFFICE VISIT  02/27/2021  HPI: Judy Padilla is a 55 y.o. female 17 days s/p left simple mastectomy with sentinel lymph node biopsy and immediate reconstruction with tissue expander placed.  She just had her drain removed a couple days ago.  She reports reasonably good pain control.  She still has original dressings in place.  She has had her pathology report reviewed and is expecting to initiate chemotherapy early next month.  Vital signs: BP 125/78   Pulse 92   Temp 98.9 F (37.2 C) (Oral)   Ht 5\' 8"  (1.727 m)   Wt 193 lb 9.6 oz (87.8 kg)   LMP 01/04/2018   SpO2 96%   BMI 29.44 kg/m    Physical Exam: Constitutional: She appears well. Left breast has original honeycomb dressing intact.  Her flaps look great. Port site intact with some Dermabond still flaking away.  Assessment/Plan: This is a 54 y.o. female 17 days s/p left simple mastectomy with sentinel lymph node biopsy. Triple positive left breast cancer.  Patient Active Problem List   Diagnosis Date Noted  . Malignant neoplasm of lower-outer quadrant of left breast of female, estrogen receptor positive ([place]) 12/25/2020    -Anticipating chemotherapy to begin soon. Reminded patient of eventual continuance of screening of the right breast with mammography and clinical exams to come.  We will be glad to see her back as needed.   Ronny Bacon M.D., FACS 02/27/2021, 10:35 AM

## 2021-02-27 NOTE — Patient Instructions (Signed)
Please call if you have any questions or concerns. Continue your follow up appointments with Oncology and Dr.Dillengham.

## 2021-02-28 NOTE — Patient Instructions (Signed)
Trastuzumab injection for infusion What is this medicine? TRASTUZUMAB (tras TOO zoo mab) is a monoclonal antibody. It is used to treat breast cancer and stomach cancer. This medicine may be used for other purposes; ask your health care provider or pharmacist if you have questions. COMMON BRAND NAME(S): Herceptin, Herzuma, KANJINTI, Ogivri, Ontruzant, Trazimera What should I tell my health care provider before I take this medicine? They need to know if you have any of these conditions:  heart disease  heart failure  lung or breathing disease, like asthma  an unusual or allergic reaction to trastuzumab, benzyl alcohol, or other medications, foods, dyes, or preservatives  pregnant or trying to get pregnant  breast-feeding How should I use this medicine? This drug is given as an infusion into a vein. It is administered in a hospital or clinic by a specially trained health care professional. Talk to your pediatrician regarding the use of this medicine in children. This medicine is not approved for use in children. Overdosage: If you think you have taken too much of this medicine contact a poison control center or emergency room at once. NOTE: This medicine is only for you. Do not share this medicine with others. What if I miss a dose? It is important not to miss a dose. Call your doctor or health care professional if you are unable to keep an appointment. What may interact with this medicine? This medicine may interact with the following medications:  certain types of chemotherapy, such as daunorubicin, doxorubicin, epirubicin, and idarubicin This list may not describe all possible interactions. Give your health care provider a list of all the medicines, herbs, non-prescription drugs, or dietary supplements you use. Also tell them if you smoke, drink alcohol, or use illegal drugs. Some items may interact with your medicine. What should I watch for while using this medicine? Visit your  doctor for checks on your progress. Report any side effects. Continue your course of treatment even though you feel ill unless your doctor tells you to stop. Call your doctor or health care professional for advice if you get a fever, chills or sore throat, or other symptoms of a cold or flu. Do not treat yourself. Try to avoid being around people who are sick. You may experience fever, chills and shaking during your first infusion. These effects are usually mild and can be treated with other medicines. Report any side effects during the infusion to your health care professional. Fever and chills usually do not happen with later infusions. Do not become pregnant while taking this medicine or for 7 months after stopping it. Women should inform their doctor if they wish to become pregnant or think they might be pregnant. Women of child-bearing potential will need to have a negative pregnancy test before starting this medicine. There is a potential for serious side effects to an unborn child. Talk to your health care professional or pharmacist for more information. Do not breast-feed an infant while taking this medicine or for 7 months after stopping it. Women must use effective birth control with this medicine. What side effects may I notice from receiving this medicine? Side effects that you should report to your doctor or health care professional as soon as possible:  allergic reactions like skin rash, itching or hives, swelling of the face, lips, or tongue  chest pain or palpitations  cough  dizziness  feeling faint or lightheaded, falls  fever  general ill feeling or flu-like symptoms  signs of worsening heart failure like   breathing problems; swelling in your legs and feet  unusually weak or tired Side effects that usually do not require medical attention (report to your doctor or health care professional if they continue or are bothersome):  bone pain  changes in  taste  diarrhea  joint pain  nausea/vomiting  weight loss This list may not describe all possible side effects. Call your doctor for medical advice about side effects. You may report side effects to FDA at 1-800-FDA-1088. Where should I keep my medicine? This drug is given in a hospital or clinic and will not be stored at home. NOTE: This sheet is a summary. It may not cover all possible information. If you have questions about this medicine, talk to your doctor, pharmacist, or health care provider.  2021 Elsevier/Gold Standard (2016-10-20 14:37:52) Paclitaxel injection What is this medicine? PACLITAXEL (PAK li TAX el) is a chemotherapy drug. It targets fast dividing cells, like cancer cells, and causes these cells to die. This medicine is used to treat ovarian cancer, breast cancer, lung cancer, Kaposi's sarcoma, and other cancers. This medicine may be used for other purposes; ask your health care provider or pharmacist if you have questions. COMMON BRAND NAME(S): Onxol, Taxol What should I tell my health care provider before I take this medicine? They need to know if you have any of these conditions:  history of irregular heartbeat  liver disease  low blood counts, like low white cell, platelet, or red cell counts  lung or breathing disease, like asthma  tingling of the fingers or toes, or other nerve disorder  an unusual or allergic reaction to paclitaxel, alcohol, polyoxyethylated castor oil, other chemotherapy, other medicines, foods, dyes, or preservatives  pregnant or trying to get pregnant  breast-feeding How should I use this medicine? This drug is given as an infusion into a vein. It is administered in a hospital or clinic by a specially trained health care professional. Talk to your pediatrician regarding the use of this medicine in children. Special care may be needed. Overdosage: If you think you have taken too much of this medicine contact a poison control  center or emergency room at once. NOTE: This medicine is only for you. Do not share this medicine with others. What if I miss a dose? It is important not to miss your dose. Call your doctor or health care professional if you are unable to keep an appointment. What may interact with this medicine? Do not take this medicine with any of the following medications:  live virus vaccines This medicine may also interact with the following medications:  antiviral medicines for hepatitis, HIV or AIDS  certain antibiotics like erythromycin and clarithromycin  certain medicines for fungal infections like ketoconazole and itraconazole  certain medicines for seizures like carbamazepine, phenobarbital, phenytoin  gemfibrozil  nefazodone  rifampin  St. John's wort This list may not describe all possible interactions. Give your health care provider a list of all the medicines, herbs, non-prescription drugs, or dietary supplements you use. Also tell them if you smoke, drink alcohol, or use illegal drugs. Some items may interact with your medicine. What should I watch for while using this medicine? Your condition will be monitored carefully while you are receiving this medicine. You will need important blood work done while you are taking this medicine. This medicine can cause serious allergic reactions. To reduce your risk you will need to take other medicine(s) before treatment with this medicine. If you experience allergic reactions like skin rash, itching  or hives, swelling of the face, lips, or tongue, tell your doctor or health care professional right away. In some cases, you may be given additional medicines to help with side effects. Follow all directions for their use. This drug may make you feel generally unwell. This is not uncommon, as chemotherapy can affect healthy cells as well as cancer cells. Report any side effects. Continue your course of treatment even though you feel ill unless your  doctor tells you to stop. Call your doctor or health care professional for advice if you get a fever, chills or sore throat, or other symptoms of a cold or flu. Do not treat yourself. This drug decreases your body's ability to fight infections. Try to avoid being around people who are sick. This medicine may increase your risk to bruise or bleed. Call your doctor or health care professional if you notice any unusual bleeding. Be careful brushing and flossing your teeth or using a toothpick because you may get an infection or bleed more easily. If you have any dental work done, tell your dentist you are receiving this medicine. Avoid taking products that contain aspirin, acetaminophen, ibuprofen, naproxen, or ketoprofen unless instructed by your doctor. These medicines may hide a fever. Do not become pregnant while taking this medicine. Women should inform their doctor if they wish to become pregnant or think they might be pregnant. There is a potential for serious side effects to an unborn child. Talk to your health care professional or pharmacist for more information. Do not breast-feed an infant while taking this medicine. Men are advised not to father a child while receiving this medicine. This product may contain alcohol. Ask your pharmacist or healthcare provider if this medicine contains alcohol. Be sure to tell all healthcare providers you are taking this medicine. Certain medicines, like metronidazole and disulfiram, can cause an unpleasant reaction when taken with alcohol. The reaction includes flushing, headache, nausea, vomiting, sweating, and increased thirst. The reaction can last from 30 minutes to several hours. What side effects may I notice from receiving this medicine? Side effects that you should report to your doctor or health care professional as soon as possible:  allergic reactions like skin rash, itching or hives, swelling of the face, lips, or tongue  breathing  problems  changes in vision  fast, irregular heartbeat  high or low blood pressure  mouth sores  pain, tingling, numbness in the hands or feet  signs of decreased platelets or bleeding - bruising, pinpoint red spots on the skin, black, tarry stools, blood in the urine  signs of decreased red blood cells - unusually weak or tired, feeling faint or lightheaded, falls  signs of infection - fever or chills, cough, sore throat, pain or difficulty passing urine  signs and symptoms of liver injury like dark yellow or brown urine; general ill feeling or flu-like symptoms; light-colored stools; loss of appetite; nausea; right upper belly pain; unusually weak or tired; yellowing of the eyes or skin  swelling of the ankles, feet, hands  unusually slow heartbeat Side effects that usually do not require medical attention (report to your doctor or health care professional if they continue or are bothersome):  diarrhea  hair loss  loss of appetite  muscle or joint pain  nausea, vomiting  pain, redness, or irritation at site where injected  tiredness This list may not describe all possible side effects. Call your doctor for medical advice about side effects. You may report side effects to FDA at  1-800-FDA-1088. Where should I keep my medicine? This drug is given in a hospital or clinic and will not be stored at home. NOTE: This sheet is a summary. It may not cover all possible information. If you have questions about this medicine, talk to your doctor, pharmacist, or health care provider.  2021 Elsevier/Gold Standard (2019-09-27 13:37:23)

## 2021-03-04 ENCOUNTER — Inpatient Hospital Stay: Payer: No Typology Code available for payment source

## 2021-03-04 ENCOUNTER — Other Ambulatory Visit: Payer: Self-pay

## 2021-03-04 ENCOUNTER — Telehealth: Payer: Self-pay | Admitting: *Deleted

## 2021-03-04 NOTE — Telephone Encounter (Signed)
Contacted patient with [facility] scan apt. (tomorrow at 2pm. With arrival time of 1:30pm.). pt gave verbal understanding and agrees to keep this apt.

## 2021-03-05 ENCOUNTER — Ambulatory Visit: Payer: No Typology Code available for payment source

## 2021-03-05 ENCOUNTER — Encounter
Admission: RE | Admit: 2021-03-05 | Discharge: 2021-03-05 | Disposition: A | Discharge status: Home / Self Care | Payer: No Typology Code available for payment source | Source: Ambulatory Visit | Attending: Internal Medicine | Admitting: Internal Medicine

## 2021-03-05 ENCOUNTER — Other Ambulatory Visit: Payer: Self-pay

## 2021-03-05 DIAGNOSIS — Z9221 Personal history of antineoplastic chemotherapy: Secondary | ICD-10-CM | POA: Diagnosis not present

## 2021-03-05 DIAGNOSIS — C50512 Malignant neoplasm of lower-outer quadrant of left female breast: Secondary | ICD-10-CM | POA: Diagnosis not present

## 2021-03-05 DIAGNOSIS — C50919 Malignant neoplasm of unspecified site of unspecified female breast: Secondary | ICD-10-CM | POA: Diagnosis not present

## 2021-03-05 DIAGNOSIS — Z170 Estrogen receptor positive status [ER+]: Secondary | ICD-10-CM | POA: Diagnosis not present

## 2021-03-05 MED ORDER — TECHNETIUM TC 99M-LABELED RED BLOOD CELLS IV KIT
20.0000 | PACK | Freq: Once | INTRAVENOUS | Status: AC | PRN
  Administered 2021-03-05: 21.2 via INTRAVENOUS

## 2021-03-06 DIAGNOSIS — J301 Allergic rhinitis due to pollen: Secondary | ICD-10-CM | POA: Diagnosis not present

## 2021-03-07 ENCOUNTER — Other Ambulatory Visit: Payer: Self-pay

## 2021-03-07 DIAGNOSIS — C50912 Malignant neoplasm of unspecified site of left female breast: Secondary | ICD-10-CM

## 2021-03-07 DIAGNOSIS — D4862 Neoplasm of uncertain behavior of left breast: Secondary | ICD-10-CM

## 2021-03-07 DIAGNOSIS — C50512 Malignant neoplasm of lower-outer quadrant of left female breast: Secondary | ICD-10-CM

## 2021-03-10 ENCOUNTER — Inpatient Hospital Stay: Payer: No Typology Code available for payment source

## 2021-03-10 ENCOUNTER — Encounter: Payer: Self-pay | Admitting: Internal Medicine

## 2021-03-10 ENCOUNTER — Inpatient Hospital Stay (HOSPITAL_BASED_OUTPATIENT_CLINIC_OR_DEPARTMENT_OTHER): Payer: No Typology Code available for payment source | Admitting: Internal Medicine

## 2021-03-10 ENCOUNTER — Other Ambulatory Visit: Payer: Self-pay

## 2021-03-10 ENCOUNTER — Inpatient Hospital Stay: Payer: No Typology Code available for payment source | Attending: Internal Medicine

## 2021-03-10 VITALS — BP 127/76 | HR 90 | Temp 97.3°F | Resp 16

## 2021-03-10 VITALS — BP 119/78 | HR 82 | Temp 97.5°F | Resp 16 | Ht 68.0 in | Wt 193.0 lb

## 2021-03-10 DIAGNOSIS — J45909 Unspecified asthma, uncomplicated: Secondary | ICD-10-CM | POA: Insufficient documentation

## 2021-03-10 DIAGNOSIS — C50512 Malignant neoplasm of lower-outer quadrant of left female breast: Secondary | ICD-10-CM | POA: Diagnosis not present

## 2021-03-10 DIAGNOSIS — Z5112 Encounter for antineoplastic immunotherapy: Secondary | ICD-10-CM | POA: Insufficient documentation

## 2021-03-10 DIAGNOSIS — Z170 Estrogen receptor positive status [ER+]: Secondary | ICD-10-CM

## 2021-03-10 DIAGNOSIS — Z79899 Other long term (current) drug therapy: Secondary | ICD-10-CM | POA: Insufficient documentation

## 2021-03-10 DIAGNOSIS — K625 Hemorrhage of anus and rectum: Secondary | ICD-10-CM | POA: Diagnosis not present

## 2021-03-10 DIAGNOSIS — C50912 Malignant neoplasm of unspecified site of left female breast: Secondary | ICD-10-CM

## 2021-03-10 DIAGNOSIS — I10 Essential (primary) hypertension: Secondary | ICD-10-CM | POA: Insufficient documentation

## 2021-03-10 DIAGNOSIS — Z5111 Encounter for antineoplastic chemotherapy: Secondary | ICD-10-CM | POA: Insufficient documentation

## 2021-03-10 DIAGNOSIS — D4862 Neoplasm of uncertain behavior of left breast: Secondary | ICD-10-CM

## 2021-03-10 LAB — CBC WITH DIFFERENTIAL/PLATELET
Abs Immature Granulocytes: 0.02 10*3/uL (ref 0.00–0.07)
Basophils Absolute: 0 10*3/uL (ref 0.0–0.1)
Basophils Relative: 1 %
Eosinophils Absolute: 0.6 10*3/uL — ABNORMAL HIGH (ref 0.0–0.5)
Eosinophils Relative: 9 %
HCT: 37.9 % (ref 36.0–46.0)
Hemoglobin: 12.2 g/dL (ref 12.0–15.0)
Immature Granulocytes: 0 %
Lymphocytes Relative: 30 %
Lymphs Abs: 2 10*3/uL (ref 0.7–4.0)
MCH: 26.3 pg (ref 26.0–34.0)
MCHC: 32.2 g/dL (ref 30.0–36.0)
MCV: 81.9 fL (ref 80.0–100.0)
Monocytes Absolute: 0.6 10*3/uL (ref 0.1–1.0)
Monocytes Relative: 10 %
Neutro Abs: 3.3 10*3/uL (ref 1.7–7.7)
Neutrophils Relative %: 50 %
Platelets: 275 10*3/uL (ref 150–400)
RBC: 4.63 MIL/uL (ref 3.87–5.11)
RDW: 14.2 % (ref 11.5–15.5)
WBC: 6.5 10*3/uL (ref 4.0–10.5)
nRBC: 0 % (ref 0.0–0.2)

## 2021-03-10 LAB — COMPREHENSIVE METABOLIC PANEL
ALT: 40 U/L (ref 0–44)
AST: 26 U/L (ref 15–41)
Albumin: 4.2 g/dL (ref 3.5–5.0)
Alkaline Phosphatase: 91 U/L (ref 38–126)
Anion gap: 10 (ref 5–15)
BUN: 18 mg/dL (ref 6–20)
CO2: 24 mmol/L (ref 22–32)
Calcium: 9.2 mg/dL (ref 8.9–10.3)
Chloride: 107 mmol/L (ref 98–111)
Creatinine, Ser: 0.81 mg/dL (ref 0.44–1.00)
GFR, Estimated: >60 mL/min (ref >60)
Glucose, Bld: 99 mg/dL (ref 70–99)
Potassium: 4 mmol/L (ref 3.5–5.1)
Sodium: 141 mmol/L (ref 135–145)
Total Bilirubin: 0.8 mg/dL (ref 0.3–1.2)
Total Protein: 7.2 g/dL (ref 6.5–8.1)

## 2021-03-10 MED ORDER — SODIUM CHLORIDE 0.9 % IV SOLN
20.0000 mg | Freq: Once | INTRAVENOUS | Status: AC
  Administered 2021-03-10: 20 mg via INTRAVENOUS
  Filled 2021-03-10: qty 20

## 2021-03-10 MED ORDER — DIPHENHYDRAMINE HCL 50 MG/ML IJ SOLN
50.0000 mg | Freq: Once | INTRAMUSCULAR | Status: AC
  Administered 2021-03-10: 50 mg via INTRAVENOUS
  Filled 2021-03-10: qty 1

## 2021-03-10 MED ORDER — HEPARIN SOD (PORK) LOCK FLUSH 100 UNIT/ML IV SOLN
INTRAVENOUS | Status: AC
  Filled 2021-03-10: qty 5

## 2021-03-10 MED ORDER — FAMOTIDINE 20 MG IN NS 100 ML IVPB
20.0000 mg | Freq: Once | INTRAVENOUS | Status: AC
  Administered 2021-03-10: 20 mg via INTRAVENOUS
  Filled 2021-03-10: qty 20

## 2021-03-10 MED ORDER — ACETAMINOPHEN 325 MG PO TABS
650.0000 mg | ORAL_TABLET | Freq: Once | ORAL | Status: AC
Start: 2021-03-10 — End: 2021-03-10
  Administered 2021-03-10: 650 mg via ORAL
  Filled 2021-03-10: qty 2

## 2021-03-10 MED ORDER — SODIUM CHLORIDE 0.9 % IV SOLN
80.0000 mg/m2 | Freq: Once | INTRAVENOUS | Status: AC
  Administered 2021-03-10: 162 mg via INTRAVENOUS
  Filled 2021-03-10: qty 27

## 2021-03-10 MED ORDER — TRASTUZUMAB-ANNS CHEMO 150 MG IV SOLR
4.0000 mg/kg | Freq: Once | INTRAVENOUS | Status: AC
  Administered 2021-03-10: 357 mg via INTRAVENOUS
  Filled 2021-03-10: qty 17

## 2021-03-10 MED ORDER — SODIUM CHLORIDE 0.9 % IV SOLN
Freq: Once | INTRAVENOUS | Status: AC
  Filled 2021-03-10: qty 250

## 2021-03-10 MED ORDER — HEPARIN SOD (PORK) LOCK FLUSH 100 UNIT/ML IV SOLN
500.0000 [IU] | Freq: Once | INTRAVENOUS | Status: AC | PRN
  Administered 2021-03-10: 500 [IU]
  Filled 2021-03-10: qty 5

## 2021-03-10 NOTE — Patient Instructions (Signed)
[facility]  Discharge Instructions: Thank you for choosing [facility] to provide your oncology and hematology care.  If you have a lab appointment with the [facility], please go directly to the [facility] and check in at the registration area.  Wear comfortable clothing and clothing appropriate for easy access to any Portacath or PICC line.   We strive to give you quality time with your provider. You may need to reschedule your appointment if you arrive late (15 or more minutes).  Arriving late affects you and other patients whose appointments are after yours.  Also, if you miss three or more appointments without notifying the office, you may be dismissed from the clinic at the provider's discretion.      For prescription refill requests, have your pharmacy contact our office and allow 72 hours for refills to be completed.    Today you received the following chemotherapy and/or immunotherapy agents : Trastuzumab, Taxol     To help prevent nausea and vomiting after your treatment, we encourage you to take your nausea medication as directed.  BELOW ARE SYMPTOMS THAT SHOULD BE REPORTED IMMEDIATELY: . *FEVER GREATER THAN 100.4 F (38 C) OR HIGHER . *CHILLS OR SWEATING . *NAUSEA AND VOMITING THAT IS NOT CONTROLLED WITH YOUR NAUSEA MEDICATION . *UNUSUAL SHORTNESS OF BREATH . *UNUSUAL BRUISING OR BLEEDING . *URINARY PROBLEMS (pain or burning when urinating, or frequent urination) . *BOWEL PROBLEMS (unusual diarrhea, constipation, pain near the anus) . TENDERNESS IN MOUTH AND THROAT WITH OR WITHOUT PRESENCE OF ULCERS (sore throat, sores in mouth, or a toothache) . UNUSUAL RASH, SWELLING OR PAIN  . UNUSUAL VAGINAL DISCHARGE OR ITCHING   Items with * indicate a potential emergency and should be followed up as soon as possible or go to the Emergency Department if any problems should occur.  Please show the CHEMOTHERAPY ALERT CARD or  IMMUNOTHERAPY ALERT CARD at check-in to the Emergency Department and triage nurse.  Should you have questions after your visit or need to cancel or reschedule your appointment, please contact [facility]  [phone] and follow the prompts.  Office hours are 8:00 a.m. to 4:30 p.m. Monday - Friday. Please note that voicemails left after 4:00 p.m. may not be returned until the following business day.  We are closed weekends and major holidays. You have access to a nurse at all times for urgent questions. Please call the main number to the clinic [phone] and follow the prompts.  For any non-urgent questions, you may also contact your provider using MyChart. We now offer e-Visits for anyone 18 and older to request care online for non-urgent symptoms. For details visit [email or web address].   Also download the MyChart app! Go to the app store, search "MyChart", open the app, select [facility], and log in with your MyChart username and password.  Due to Covid, a mask is required upon entering the hospital/clinic. If you do not have a mask, one will be given to you upon arrival. For doctor visits, patients may have 1 support person aged 55 or older with them. For treatment visits, patients cannot have anyone with them due to current Covid guidelines and our immunocompromised population.

## 2021-03-10 NOTE — Progress Notes (Signed)
[facility] NOTE  Patient Care Team: [name], MD as PCP - General (Internal Medicine) [name], RN as Registered Nurse  CHIEF COMPLAINTS/PURPOSE OF CONSULTATION: Breast cancer    Oncology History Overview Note  # April 2022-stage Ia mammary carcinoma ER/PR positive HER2 positive [TRIPLE positive]; negative margins s/p simple mastectomy with plan for immediate reconstruction [Dr.Rodenberg/[name]]; NO RT  # May 2nd, 2022- Taxol-Herceptin  # MUGA scan- 64% [03/06/2021]-   # LMP- mid 2020; Uterine ablation- [phone]; # HTN; Asthma- well controlled [on allergy shots];Marland Kitchen    Malignant neoplasm of lower-outer quadrant of left breast of female, estrogen receptor positive ([place])  12/25/2020 Initial Diagnosis   Malignant neoplasm of lower-outer quadrant of left breast of female, estrogen receptor positive ([place])   02/26/2021 Cancer Staging   Staging form: Breast, AJCC 8th Edition - Pathologic: Stage IA (pT1c, pN0, cM0, G3, ER+, PR+, HER2+) - Signed by Cammie Sickle, MD on 02/26/2021 Mitotic count score: Score 3 Histologic grading system: 3 grade system   03/10/2021 -  Chemotherapy    Patient is on Treatment Plan: BREAST PACLITAXEL + TRASTUZUMAB Q7D / TRASTUZUMAB Q21D         HISTORY OF PRESENTING ILLNESS:  Judy Padilla 55 y.o.  female newly diagnosed breast cancer is here for follow-up/proceed with adjuvant chemotherapy.  Patient denies any shortness of breath or cough.  No fevers or chills.  Patient seems to be healing well from surgery.   Review of Systems  Constitutional: Negative for chills, diaphoresis, fever, malaise/fatigue and weight loss.  HENT: Negative for nosebleeds and sore throat.   Eyes: Negative for double vision.  Respiratory: Negative for cough, hemoptysis, sputum production, shortness of breath and wheezing.   Cardiovascular: Negative for chest pain, palpitations, orthopnea and leg swelling.  Gastrointestinal:  Negative for abdominal pain, blood in stool, constipation, diarrhea, heartburn, melena, nausea and vomiting.  Genitourinary: Negative for dysuria, frequency and urgency.  Musculoskeletal: Negative for back pain and joint pain.  Skin: Negative.  Negative for itching and rash.  Neurological: Negative for dizziness, tingling, focal weakness, weakness and headaches.  Endo/Heme/Allergies: Does not bruise/bleed easily.  Psychiatric/Behavioral: Negative for depression. The patient is not nervous/anxious and does not have insomnia.      MEDICAL HISTORY:  Past Medical History:  Diagnosis Date  . Asthma    well controlled  . Breast cancer ([place])   . Family history of adverse reaction to anesthesia    sister-hard time waking up  . GERD (gastroesophageal reflux disease)   . Hypertension     SURGICAL HISTORY: Past Surgical History:  Procedure Laterality Date  . ABLATION    . BREAST BIOPSY Left 2011   Benign per pt  . BREAST BIOPSY Left 12/12/2020   3:30 3 cmfn, Q marker, path pending  . BREAST BIOPSY Left 12/12/2020   3:30 1 cmfn, Vision marker, path pending  . BREAST RECONSTRUCTION WITH PLACEMENT OF TISSUE EXPANDER AND FLEX HD (ACELLULAR HYDRATED DERMIS) Left 02/10/2021   Procedure: IMMEDIATE LEFT BREAST RECONSTRUCTION WITH PLACEMENT OF TISSUE EXPANDER AND FLEX HD (ACELLULAR HYDRATED DERMIS);  Surgeon: Wallace Going, DO;  Location: [facility] ORS;  Service: Plastics;  Laterality: Left;  . COLONOSCOPY  01/15/2020  . PORTACATH PLACEMENT Right 02/10/2021   Procedure: INSERTION PORT-A-CATH;  Surgeon: Ronny Bacon, MD;  Location: [facility] OR[facility];  Service: General;  Laterality: Right;  . SIMPLE MASTECTOMY WITH AXILLARY SENTINEL NODE BIOPSY Left 02/10/2021   Procedure: SIMPLE MASTECTOMY WITH AXILLARY SENTINEL NODE BIOPSY;  Surgeon: Ronny Bacon, MD;  Location: [facility];  Service: General;  Laterality: Left;    SOCIAL HISTORY: Social History   Socioeconomic History  . Marital status: Married     Spouse name: Not on file  . Number of children: Not on file  . Years of education: Not on file  . Highest education level: Not on file  Occupational History  . Not on file  Tobacco Use  . Smoking status: Never Smoker  . Smokeless tobacco: Never Used  Vaping Use  . Vaping Use: Never used  Substance and Sexual Activity  . Alcohol use: Not Currently  . Drug use: Never  . Sexual activity: Not on file  Other Topics Concern  . Not on file  Social History Narrative   Lives in [place] with husband; kids- college. Works for [employer]- working from home. No smoking or alcohol.    Social Determinants of Health   Financial Resource Strain: Not on file  Food Insecurity: Not on file  Transportation Needs: Not on file  Physical Activity: Not on file  Stress: Not on file  Social Connections: Not on file  Intimate Partner Violence: Not on file    FAMILY HISTORY: Family History  Problem Relation Age of Onset  . Hypertension Mother   . Diabetes Mother   . Hypertension Father   . Diabetes Father   . Cancer Father        prostate cancer-70s  . Cancer Maternal Grandmother        uterine cancer  . Breast cancer Sister        in in 40s.     ALLERGIES:  is allergic to shrimp extract allergy skin test and other.  MEDICATIONS:  Current Outpatient Medications  Medication Sig Dispense Refill  . albuterol (VENTOLIN HFA) 108 (90 Base) MCG/ACT inhaler Inhale 1 puff into the lungs every 6 (six) hours as needed for shortness of breath.    Marland Kitchen amLODipine (NORVASC) 10 MG tablet Take 10 mg by mouth every morning.    Marland Kitchen atorvastatin (LIPITOR) 80 MG tablet Take 80 mg by mouth every morning.    . budesonide (PULMICORT) 0.5 MG/2ML nebulizer solution Take 2 mLs by nebulization daily.    . cetirizine (ZYRTEC) 10 MG tablet Take 10 mg by mouth daily as needed for allergies.    . Cholecalciferol 1.25 MG (50000 UT) capsule Take 50,000 Units by mouth once a week.    . diazepam (VALIUM) 2  MG tablet Take 1 tablet (2 mg total) by mouth every 12 (twelve) hours as needed for muscle spasms. 20 tablet 0  . EPINEPHrine 0.3 mg/0.3 mL IJ SOAJ injection Inject 0.3 mg into the muscle as needed for anaphylaxis.    . fluticasone (FLONASE) 50 MCG/ACT nasal spray Place 1 spray into both nostrils daily as needed for allergies.    . fluticasone furoate-vilanterol (BREO ELLIPTA) 100-25 MCG/INH AEPB Inhale 1 puff into the lungs as needed for shortness of breath.    . GNP BUDESONIDE NASAL SPRAY NA Place 1 spray into the nose daily.    Marland Kitchen lidocaine-prilocaine (EMLA) cream Apply 30 -45 mins prior to port access. 30 g 0  . Multiple Vitamins-Minerals (CENTRUM ADULTS) TABS Take 1 tablet by mouth daily.    . naproxen sodium (ALEVE) 220 MG tablet Take 220 mg by mouth daily as needed (knee pain).    . NON FORMULARY Takes weekly allergy shots    . ondansetron (ZOFRAN) 4 MG tablet Take 1 tablet (4 mg total)  by mouth every 8 (eight) hours as needed for nausea or vomiting. 20 tablet 0  . ondansetron (ZOFRAN) 8 MG tablet One pill every 8 hours as needed for nausea/vomitting. 40 tablet 1  . pantoprazole (PROTONIX) 40 MG tablet Take 40 mg by mouth every morning.    . prochlorperazine (COMPAZINE) 10 MG tablet Take 1 tablet (10 mg total) by mouth every 6 (six) hours as needed for nausea or vomiting. 40 tablet 1   No current facility-administered medications for this visit.      Marland Kitchen  PHYSICAL EXAMINATION: ECOG PERFORMANCE STATUS: 0 - Asymptomatic  Vitals:   03/10/21 0939  BP: 119/78  Pulse: 82  Resp: 16  Temp: (!) 97.5 F (36.4 C)  SpO2: 98%   Filed Weights   03/10/21 0939  Weight: 193 lb (87.5 kg)    Physical Exam Constitutional:      Comments: Accompanied by husband.  Ambulating independently.  HENT:     Head: Normocephalic and atraumatic.     Mouth/Throat:     Pharynx: No oropharyngeal exudate.  Eyes:     Pupils: Pupils are equal, round, and reactive to light.  Cardiovascular:     Rate and  Rhythm: Normal rate and regular rhythm.  Pulmonary:     Effort: Pulmonary effort is normal. No respiratory distress.     Breath sounds: Normal breath sounds. No wheezing.  Abdominal:     General: Bowel sounds are normal. There is no distension.     Palpations: Abdomen is soft. There is no mass.     Tenderness: There is no abdominal tenderness. There is no guarding or rebound.  Musculoskeletal:        General: No tenderness. Normal range of motion.     Cervical back: Normal range of motion and neck supple.  Skin:    General: Skin is warm.  Neurological:     Mental Status: She is alert and oriented to person, place, and time.  Psychiatric:        Mood and Affect: Affect normal.      LABORATORY DATA:  I have reviewed the data as listed Lab Results  Component Value Date   WBC 6.5 03/10/2021   HGB 12.2 03/10/2021   HCT 37.9 03/10/2021   MCV 81.9 03/10/2021   PLT 275 03/10/2021   Recent Labs    02/04/21 1110 03/10/21 0902  NA 138 141  K 4.1 4.0  CL 107 107  CO2 24 24  GLUCOSE 99 99  BUN 22* 18  CREATININE 0.90 0.81  CALCIUM 9.4 9.2  GFRNONAA >60 >60  PROT 7.2 7.2  ALBUMIN 4.3 4.2  AST 22 26  ALT 28 40  ALKPHOS 83 91  BILITOT 0.9 0.8    RADIOGRAPHIC STUDIES: I have personally reviewed the radiological images as listed and agreed with the findings in the report. NM Cardiac Muga Rest  Result Date: 03/06/2021 CLINICAL DATA:  Breast cancer. Evaluate cardiac function in relation to chemotherapy. EXAM: NUCLEAR MEDICINE CARDIAC BLOOD POOL IMAGING (MUGA) TECHNIQUE: Cardiac multi-gated acquisition was performed at rest following intravenous injection of Tc-52mlabeled red blood cells. RADIOPHARMACEUTICALS:  21.2 mCi Tc-99mertechnetate in-vitro labeled red blood cells IV COMPARISON:  None. FINDINGS: No  focal wall motion abnormality of the left ventricle. Calculated left ventricular ejection fraction equals 64% IMPRESSION: Left ventricular ejection fraction equals64 %.  Electronically Signed   By: StSuzy Bouchard.D.   On: 03/06/2021 08:19   NM Sentinel Node Inj-No Rpt (Breast)  Result Date:  02/10/2021 Sulfur colloid was injected by the nuclear medicine technologist for melanoma sentinel node.   MM BREAST SURGICAL SPECIMEN  Result Date: 02/10/2021 CLINICAL DATA:  Specimen radiograph of the left breast following excisional biopsy. EXAM: SPECIMEN RADIOGRAPH OF THE LEFT BREAST COMPARISON:  Previous exam(s). FINDINGS: Status post excision of the left breast. Specimen radiograph shows there is a spiral, vision and Q shaped clip in the surgical specimen. IMPRESSION: Specimen radiograph of the left breast. Electronically Signed   By: Lillia Mountain M.D.   On: 02/10/2021 15:18   DG CHEST PORT 1 VIEW  Result Date: 02/10/2021 CLINICAL DATA:  Port-A-Cath plate splint EXAM: PORTABLE CHEST 1 VIEW COMPARISON:  None. FINDINGS: Port-A-Cath tip is at the cavoatrial junction. No pneumothorax. Lungs are clear. Heart is borderline enlarged with pulmonary vascularity normal. No adenopathy. Breast tissue expander noted on the left. No bone lesions. IMPRESSION: Port-A-Cath tip at cavoatrial junction. No pneumothorax. Lungs clear. Heart borderline prominent. Tissue expander left breast. Electronically Signed   By: Lowella Grip III M.D.   On: 02/10/2021 15:16   DG C-Arm 1-60 Min-No Report  Result Date: 02/10/2021 Fluoroscopy was utilized by the requesting physician.  No radiographic interpretation.    ASSESSMENT & PLAN:   Malignant neoplasm of lower-outer quadrant of left breast of female, estrogen receptor positive ([place]) # Left breast- CA- s/p mastectomy-  pT1c pN0 [Stage IA]Grade 3.  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   # proceed with adjuvant Taxol-Herceprin Labs today reviewed;  acceptable for treatment today.  Again reviewed the potential side effects including but not limited to nausea vomiting diarrhea low blood counts/tingling numbness etc.  # Genetics: Patient meets the  criteria for genetics; sister diagnosed with breast cancer.  Referred to genetics.  # DISPOSITION: # Proceed with Taxol-herceptin  # 1 week- labs- cbc/cmp; taxol-herceptin # follow up in X-MD labs- cbc/cmp;Taxol-Herceptin-weekly- # 1 week- labs- cbc/cmp; taxol-herceptin # # follow up in X-MD labs- cbc/cmp;Taxol-Herceptin-weekly- Dr.B   All questions were answered. The patient/family knows to call the clinic with any problems, questions or concerns.    Cammie Sickle, MD 03/10/2021 4:19 PM

## 2021-03-10 NOTE — Assessment & Plan Note (Addendum)
#  Left breast- CA- s/p mastectomy-  pT1c pN0 [Stage IA]Grade 3.  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   # proceed with adjuvant Taxol-Herceprin Labs today reviewed;  acceptable for treatment today.  Again reviewed the potential side effects including but not limited to nausea vomiting diarrhea low blood counts/tingling numbness etc.  # Genetics: Patient meets the criteria for genetics; sister diagnosed with breast cancer.  Referred to genetics.  # DISPOSITION: # Proceed with Taxol-herceptin  # 1 week- labs- cbc/cmp; taxol-herceptin # follow up in X-MD labs- cbc/cmp;Taxol-Herceptin-weekly- # 1 week- labs- cbc/cmp; taxol-herceptin # # follow up in X-MD labs- cbc/cmp;Taxol-Herceptin-weekly- Dr.B

## 2021-03-11 ENCOUNTER — Telehealth: Payer: Self-pay

## 2021-03-11 NOTE — Telephone Encounter (Signed)
Telephone call to patient for follow up after receiving first infusion.   Patient states infusion went great.  States eating good and drinking plenty of fluids.   Denies any nausea or vomiting.  Encouraged patient to call for any concerns or questions. 

## 2021-03-12 ENCOUNTER — Encounter: Payer: No Typology Code available for payment source | Admitting: Licensed Clinical Social Worker

## 2021-03-12 ENCOUNTER — Other Ambulatory Visit: Payer: No Typology Code available for payment source

## 2021-03-13 DIAGNOSIS — J301 Allergic rhinitis due to pollen: Secondary | ICD-10-CM | POA: Diagnosis not present

## 2021-03-14 ENCOUNTER — Ambulatory Visit (INDEPENDENT_AMBULATORY_CARE_PROVIDER_SITE_OTHER): Payer: No Typology Code available for payment source | Admitting: Surgical

## 2021-03-14 ENCOUNTER — Telehealth: Payer: Self-pay | Admitting: Surgical

## 2021-03-14 ENCOUNTER — Other Ambulatory Visit: Payer: Self-pay

## 2021-03-14 DIAGNOSIS — D4862 Neoplasm of uncertain behavior of left breast: Secondary | ICD-10-CM

## 2021-03-14 DIAGNOSIS — C50512 Malignant neoplasm of lower-outer quadrant of left female breast: Secondary | ICD-10-CM

## 2021-03-14 DIAGNOSIS — Z170 Estrogen receptor positive status [ER+]: Secondary | ICD-10-CM

## 2021-03-14 NOTE — Progress Notes (Signed)
Patient is a 55 year old female here for follow-up on her left breast reconstruction.  She had a tissue expander placed on 02/10/2021 with Dr. Marla Roe after mastectomy by general surgery. She is 1 month postop.  She is currently undergoing chemotherapy.  She will complete this sometime around late July.  Chaperone on exam On exam left breast incision is intact and healing well.  There is no erythema of the breast.  There is no swelling or subcutaneous fluid collections noted.  No cellulitic changes.  No wounds noted.  We placed injectable saline in the Expander using a sterile technique: Left: 50 cc for a total of 250 / 535 cc  There is no sign of infection, seroma, hematoma Continue to avoid strenuous activities.  Continue to avoid heavy lifting.  Continue to wear compressive garment 24/7. Recommend following up in 2 weeks for reevaluation and additional fill

## 2021-03-14 NOTE — Telephone Encounter (Signed)
Patient requested a prescription to be faxed to [facility] for prosthetic bras. She lives in [place] so this location is closer to her than [facility]. Per [facility], it should state 4 bras -slip on prosthetic. Their fax [phone]. Please call patient to advise when prescription is faxed to them.

## 2021-03-17 ENCOUNTER — Other Ambulatory Visit: Payer: Self-pay

## 2021-03-17 ENCOUNTER — Inpatient Hospital Stay: Payer: No Typology Code available for payment source

## 2021-03-17 VITALS — BP 121/74 | HR 83 | Temp 97.0°F | Wt 194.0 lb

## 2021-03-17 DIAGNOSIS — Z170 Estrogen receptor positive status [ER+]: Secondary | ICD-10-CM

## 2021-03-17 DIAGNOSIS — Z5112 Encounter for antineoplastic immunotherapy: Secondary | ICD-10-CM | POA: Diagnosis not present

## 2021-03-17 DIAGNOSIS — C50512 Malignant neoplasm of lower-outer quadrant of left female breast: Secondary | ICD-10-CM

## 2021-03-17 LAB — CBC WITH DIFFERENTIAL/PLATELET
Abs Immature Granulocytes: 0.06 10*3/uL (ref 0.00–0.07)
Basophils Absolute: 0 10*3/uL (ref 0.0–0.1)
Basophils Relative: 1 %
Eosinophils Absolute: 0.3 10*3/uL (ref 0.0–0.5)
Eosinophils Relative: 7 %
HCT: 34.7 % — ABNORMAL LOW (ref 36.0–46.0)
Hemoglobin: 11.3 g/dL — ABNORMAL LOW (ref 12.0–15.0)
Immature Granulocytes: 1 %
Lymphocytes Relative: 26 %
Lymphs Abs: 1.1 10*3/uL (ref 0.7–4.0)
MCH: 26.8 pg (ref 26.0–34.0)
MCHC: 32.6 g/dL (ref 30.0–36.0)
MCV: 82.4 fL (ref 80.0–100.0)
Monocytes Absolute: 0.4 10*3/uL (ref 0.1–1.0)
Monocytes Relative: 9 %
Neutro Abs: 2.4 10*3/uL (ref 1.7–7.7)
Neutrophils Relative %: 56 %
Platelets: 257 10*3/uL (ref 150–400)
RBC: 4.21 MIL/uL (ref 3.87–5.11)
RDW: 14.1 % (ref 11.5–15.5)
WBC: 4.4 10*3/uL (ref 4.0–10.5)
nRBC: 0 % (ref 0.0–0.2)

## 2021-03-17 LAB — COMPREHENSIVE METABOLIC PANEL
ALT: 80 U/L — ABNORMAL HIGH (ref 0–44)
AST: 41 U/L (ref 15–41)
Albumin: 3.8 g/dL (ref 3.5–5.0)
Alkaline Phosphatase: 76 U/L (ref 38–126)
Anion gap: 8 (ref 5–15)
BUN: 21 mg/dL — ABNORMAL HIGH (ref 6–20)
CO2: 24 mmol/L (ref 22–32)
Calcium: 9 mg/dL (ref 8.9–10.3)
Chloride: 107 mmol/L (ref 98–111)
Creatinine, Ser: 0.94 mg/dL (ref 0.44–1.00)
GFR, Estimated: >60 mL/min (ref >60)
Glucose, Bld: 105 mg/dL — ABNORMAL HIGH (ref 70–99)
Potassium: 4.1 mmol/L (ref 3.5–5.1)
Sodium: 139 mmol/L (ref 135–145)
Total Bilirubin: 0.8 mg/dL (ref 0.3–1.2)
Total Protein: 6.4 g/dL — ABNORMAL LOW (ref 6.5–8.1)

## 2021-03-17 MED ORDER — ACETAMINOPHEN 325 MG PO TABS
650.0000 mg | ORAL_TABLET | Freq: Once | ORAL | Status: AC
  Administered 2021-03-17: 650 mg via ORAL
  Filled 2021-03-17: qty 2

## 2021-03-17 MED ORDER — SODIUM CHLORIDE 0.9 % IV SOLN
20.0000 mg | Freq: Once | INTRAVENOUS | Status: AC
  Administered 2021-03-17: 20 mg via INTRAVENOUS
  Filled 2021-03-17: qty 20

## 2021-03-17 MED ORDER — HEPARIN SOD (PORK) LOCK FLUSH 100 UNIT/ML IV SOLN
INTRAVENOUS | Status: AC
  Filled 2021-03-17: qty 5

## 2021-03-17 MED ORDER — SODIUM CHLORIDE 0.9% FLUSH
10.0000 mL | Freq: Once | INTRAVENOUS | Status: AC
  Administered 2021-03-17: 10 mL via INTRAVENOUS
  Filled 2021-03-17: qty 10

## 2021-03-17 MED ORDER — SODIUM CHLORIDE 0.9% FLUSH
10.0000 mL | INTRAVENOUS | Status: DC | PRN
  Filled 2021-03-17: qty 10

## 2021-03-17 MED ORDER — SODIUM CHLORIDE 0.9 % IV SOLN
80.0000 mg/m2 | Freq: Once | INTRAVENOUS | Status: AC
  Administered 2021-03-17: 162 mg via INTRAVENOUS
  Filled 2021-03-17: qty 27

## 2021-03-17 MED ORDER — HEPARIN SOD (PORK) LOCK FLUSH 100 UNIT/ML IV SOLN
500.0000 [IU] | Freq: Once | INTRAVENOUS | Status: AC
  Administered 2021-03-17: 500 [IU] via INTRAVENOUS
  Filled 2021-03-17: qty 5

## 2021-03-17 MED ORDER — FAMOTIDINE 20 MG IN NS 100 ML IVPB
20.0000 mg | Freq: Once | INTRAVENOUS | Status: AC
  Administered 2021-03-17: 20 mg via INTRAVENOUS
  Filled 2021-03-17: qty 20

## 2021-03-17 MED ORDER — HEPARIN SOD (PORK) LOCK FLUSH 100 UNIT/ML IV SOLN
500.0000 [IU] | Freq: Once | INTRAVENOUS | Status: DC | PRN
  Filled 2021-03-17: qty 5

## 2021-03-17 MED ORDER — DIPHENHYDRAMINE HCL 50 MG/ML IJ SOLN
50.0000 mg | Freq: Once | INTRAMUSCULAR | Status: AC
  Administered 2021-03-17: 50 mg via INTRAVENOUS
  Filled 2021-03-17: qty 1

## 2021-03-17 MED ORDER — SODIUM CHLORIDE 0.9 % IV SOLN
Freq: Once | INTRAVENOUS | Status: AC
  Filled 2021-03-17: qty 250

## 2021-03-17 MED ORDER — TRASTUZUMAB-ANNS CHEMO 150 MG IV SOLR
2.0000 mg/kg | Freq: Once | INTRAVENOUS | Status: AC
  Administered 2021-03-17: 168 mg via INTRAVENOUS
  Filled 2021-03-17: qty 8

## 2021-03-17 NOTE — Patient Instructions (Signed)
[facility]  Discharge Instructions: Thank you for choosing [facility] to provide your oncology and hematology care.  If you have a lab appointment with the [facility], please go directly to the [facility] and check in at the registration area.  Wear comfortable clothing and clothing appropriate for easy access to any Portacath or PICC line.   We strive to give you quality time with your provider. You may need to reschedule your appointment if you arrive late (15 or more minutes).  Arriving late affects you and other patients whose appointments are after yours.  Also, if you miss three or more appointments without notifying the office, you may be dismissed from the clinic at the provider's discretion.      For prescription refill requests, have your pharmacy contact our office and allow 72 hours for refills to be completed.    Today you received the following chemotherapy and/or immunotherapy agents: Herceptin, Taxol     To help prevent nausea and vomiting after your treatment, we encourage you to take your nausea medication as directed.  BELOW ARE SYMPTOMS THAT SHOULD BE REPORTED IMMEDIATELY: . *FEVER GREATER THAN 100.4 F (38 C) OR HIGHER . *CHILLS OR SWEATING . *NAUSEA AND VOMITING THAT IS NOT CONTROLLED WITH YOUR NAUSEA MEDICATION . *UNUSUAL SHORTNESS OF BREATH . *UNUSUAL BRUISING OR BLEEDING . *URINARY PROBLEMS (pain or burning when urinating, or frequent urination) . *BOWEL PROBLEMS (unusual diarrhea, constipation, pain near the anus) . TENDERNESS IN MOUTH AND THROAT WITH OR WITHOUT PRESENCE OF ULCERS (sore throat, sores in mouth, or a toothache) . UNUSUAL RASH, SWELLING OR PAIN  . UNUSUAL VAGINAL DISCHARGE OR ITCHING   Items with * indicate a potential emergency and should be followed up as soon as possible or go to the Emergency Department if any problems should occur.  Please show the CHEMOTHERAPY ALERT CARD or  IMMUNOTHERAPY ALERT CARD at check-in to the Emergency Department and triage nurse.  Should you have questions after your visit or need to cancel or reschedule your appointment, please contact [facility]  [phone] and follow the prompts.  Office hours are 8:00 a.m. to 4:30 p.m. Monday - Friday. Please note that voicemails left after 4:00 p.m. may not be returned until the following business day.  We are closed weekends and major holidays. You have access to a nurse at all times for urgent questions. Please call the main number to the clinic [phone] and follow the prompts.  For any non-urgent questions, you may also contact your provider using MyChart. We now offer e-Visits for anyone 59 and older to request care online for non-urgent symptoms. For details visit [email or web address].   Also download the MyChart app! Go to the app store, search "MyChart", open the app, select [facility], and log in with your MyChart username and password.  Due to Covid, a mask is required upon entering the hospital/clinic. If you do not have a mask, one will be given to you upon arrival. For doctor visits, patients may have 1 support person aged 33 or older with them. For treatment visits, patients cannot have anyone with them due to current Covid guidelines and our immunocompromised population.   Trastuzumab injection for infusion What is this medicine? TRASTUZUMAB (tras TOO zoo mab) is a monoclonal antibody. It is used to treat breast cancer and stomach cancer. This medicine may be used for other purposes; ask your health care provider or pharmacist if you have  questions. COMMON BRAND NAME(S): Herceptin, Galvin Proffer, Trazimera What should I tell my health care provider before I take this medicine? They need to know if you have any of these conditions:  heart disease  heart failure  lung or breathing disease, like asthma  an unusual or  allergic reaction to trastuzumab, benzyl alcohol, or other medications, foods, dyes, or preservatives  pregnant or trying to get pregnant  breast-feeding How should I use this medicine? This drug is given as an infusion into a vein. It is administered in a hospital or clinic by a specially trained health care professional. Talk to your pediatrician regarding the use of this medicine in children. This medicine is not approved for use in children. Overdosage: If you think you have taken too much of this medicine contact a poison control center or emergency room at once. NOTE: This medicine is only for you. Do not share this medicine with others. What if I miss a dose? It is important not to miss a dose. Call your doctor or health care professional if you are unable to keep an appointment. What may interact with this medicine? This medicine may interact with the following medications:  certain types of chemotherapy, such as daunorubicin, doxorubicin, epirubicin, and idarubicin This list may not describe all possible interactions. Give your health care provider a list of all the medicines, herbs, non-prescription drugs, or dietary supplements you use. Also tell them if you smoke, drink alcohol, or use illegal drugs. Some items may interact with your medicine. What should I watch for while using this medicine? Visit your doctor for checks on your progress. Report any side effects. Continue your course of treatment even though you feel ill unless your doctor tells you to stop. Call your doctor or health care professional for advice if you get a fever, chills or sore throat, or other symptoms of a cold or flu. Do not treat yourself. Try to avoid being around people who are sick. You may experience fever, chills and shaking during your first infusion. These effects are usually mild and can be treated with other medicines. Report any side effects during the infusion to your health care professional. Fever  and chills usually do not happen with later infusions. Do not become pregnant while taking this medicine or for 7 months after stopping it. Women should inform their doctor if they wish to become pregnant or think they might be pregnant. Women of child-bearing potential will need to have a negative pregnancy test before starting this medicine. There is a potential for serious side effects to an unborn child. Talk to your health care professional or pharmacist for more information. Do not breast-feed an infant while taking this medicine or for 7 months after stopping it. Women must use effective birth control with this medicine. What side effects may I notice from receiving this medicine? Side effects that you should report to your doctor or health care professional as soon as possible:  allergic reactions like skin rash, itching or hives, swelling of the face, lips, or tongue  chest pain or palpitations  cough  dizziness  feeling faint or lightheaded, falls  fever  general ill feeling or flu-like symptoms  signs of worsening heart failure like breathing problems; swelling in your legs and feet  unusually weak or tired Side effects that usually do not require medical attention (report to your doctor or health care professional if they continue or are bothersome):  bone pain  changes in taste  diarrhea  joint pain  nausea/vomiting  weight loss This list may not describe all possible side effects. Call your doctor for medical advice about side effects. You may report side effects to FDA at 1-800-FDA-1088. Where should I keep my medicine? This drug is given in a hospital or clinic and will not be stored at home. NOTE: This sheet is a summary. It may not cover all possible information. If you have questions about this medicine, talk to your doctor, pharmacist, or health care provider.  2021 Elsevier/Gold Standard (2016-10-20 14:37:52)  Paclitaxel injection What is this  medicine? PACLITAXEL (PAK li TAX el) is a chemotherapy drug. It targets fast dividing cells, like cancer cells, and causes these cells to die. This medicine is used to treat ovarian cancer, breast cancer, lung cancer, Kaposi's sarcoma, and other cancers. This medicine may be used for other purposes; ask your health care provider or pharmacist if you have questions. COMMON BRAND NAME(S): Onxol, Taxol What should I tell my health care provider before I take this medicine? They need to know if you have any of these conditions:  history of irregular heartbeat  liver disease  low blood counts, like low white cell, platelet, or red cell counts  lung or breathing disease, like asthma  tingling of the fingers or toes, or other nerve disorder  an unusual or allergic reaction to paclitaxel, alcohol, polyoxyethylated castor oil, other chemotherapy, other medicines, foods, dyes, or preservatives  pregnant or trying to get pregnant  breast-feeding How should I use this medicine? This drug is given as an infusion into a vein. It is administered in a hospital or clinic by a specially trained health care professional. Talk to your pediatrician regarding the use of this medicine in children. Special care may be needed. Overdosage: If you think you have taken too much of this medicine contact a poison control center or emergency room at once. NOTE: This medicine is only for you. Do not share this medicine with others. What if I miss a dose? It is important not to miss your dose. Call your doctor or health care professional if you are unable to keep an appointment. What may interact with this medicine? Do not take this medicine with any of the following medications:  live virus vaccines This medicine may also interact with the following medications:  antiviral medicines for hepatitis, HIV or AIDS  certain antibiotics like erythromycin and clarithromycin  certain medicines for fungal infections  like ketoconazole and itraconazole  certain medicines for seizures like carbamazepine, phenobarbital, phenytoin  gemfibrozil  nefazodone  rifampin  St. John's wort This list may not describe all possible interactions. Give your health care provider a list of all the medicines, herbs, non-prescription drugs, or dietary supplements you use. Also tell them if you smoke, drink alcohol, or use illegal drugs. Some items may interact with your medicine. What should I watch for while using this medicine? Your condition will be monitored carefully while you are receiving this medicine. You will need important blood work done while you are taking this medicine. This medicine can cause serious allergic reactions. To reduce your risk you will need to take other medicine(s) before treatment with this medicine. If you experience allergic reactions like skin rash, itching or hives, swelling of the face, lips, or tongue, tell your doctor or health care professional right away. In some cases, you may be given additional medicines to help with side effects. Follow all directions for their use. This drug may make you  feel generally unwell. This is not uncommon, as chemotherapy can affect healthy cells as well as cancer cells. Report any side effects. Continue your course of treatment even though you feel ill unless your doctor tells you to stop. Call your doctor or health care professional for advice if you get a fever, chills or sore throat, or other symptoms of a cold or flu. Do not treat yourself. This drug decreases your body's ability to fight infections. Try to avoid being around people who are sick. This medicine may increase your risk to bruise or bleed. Call your doctor or health care professional if you notice any unusual bleeding. Be careful brushing and flossing your teeth or using a toothpick because you may get an infection or bleed more easily. If you have any dental work done, tell your dentist you  are receiving this medicine. Avoid taking products that contain aspirin, acetaminophen, ibuprofen, naproxen, or ketoprofen unless instructed by your doctor. These medicines may hide a fever. Do not become pregnant while taking this medicine. Women should inform their doctor if they wish to become pregnant or think they might be pregnant. There is a potential for serious side effects to an unborn child. Talk to your health care professional or pharmacist for more information. Do not breast-feed an infant while taking this medicine. Men are advised not to father a child while receiving this medicine. This product may contain alcohol. Ask your pharmacist or healthcare provider if this medicine contains alcohol. Be sure to tell all healthcare providers you are taking this medicine. Certain medicines, like metronidazole and disulfiram, can cause an unpleasant reaction when taken with alcohol. The reaction includes flushing, headache, nausea, vomiting, sweating, and increased thirst. The reaction can last from 30 minutes to several hours. What side effects may I notice from receiving this medicine? Side effects that you should report to your doctor or health care professional as soon as possible:  allergic reactions like skin rash, itching or hives, swelling of the face, lips, or tongue  breathing problems  changes in vision  fast, irregular heartbeat  high or low blood pressure  mouth sores  pain, tingling, numbness in the hands or feet  signs of decreased platelets or bleeding - bruising, pinpoint red spots on the skin, black, tarry stools, blood in the urine  signs of decreased red blood cells - unusually weak or tired, feeling faint or lightheaded, falls  signs of infection - fever or chills, cough, sore throat, pain or difficulty passing urine  signs and symptoms of liver injury like dark yellow or brown urine; general ill feeling or flu-like symptoms; light-colored stools; loss of  appetite; nausea; right upper belly pain; unusually weak or tired; yellowing of the eyes or skin  swelling of the ankles, feet, hands  unusually slow heartbeat Side effects that usually do not require medical attention (report to your doctor or health care professional if they continue or are bothersome):  diarrhea  hair loss  loss of appetite  muscle or joint pain  nausea, vomiting  pain, redness, or irritation at site where injected  tiredness This list may not describe all possible side effects. Call your doctor for medical advice about side effects. You may report side effects to FDA at 1-800-FDA-1088. Where should I keep my medicine? This drug is given in a hospital or clinic and will not be stored at home. NOTE: This sheet is a summary. It may not cover all possible information. If you have questions about this medicine,  talk to your doctor, pharmacist, or health care provider.  2021 Elsevier/Gold Standard (2019-09-27 13:37:23)

## 2021-03-18 ENCOUNTER — Telehealth: Payer: Self-pay | Admitting: *Deleted

## 2021-03-18 NOTE — Telephone Encounter (Signed)
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  Along with Demographics and Insurance Card.  Confirmation received and copy scanned into the chart.//AB/CMA 

## 2021-03-18 NOTE — Telephone Encounter (Signed)
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  Along with Demographics and Insurance Card.  Confirmation received and copy scanned into the chart.//AB/CMA

## 2021-03-20 DIAGNOSIS — J301 Allergic rhinitis due to pollen: Secondary | ICD-10-CM | POA: Diagnosis not present

## 2021-03-20 DIAGNOSIS — C50112 Malignant neoplasm of central portion of left female breast: Secondary | ICD-10-CM | POA: Diagnosis not present

## 2021-03-21 ENCOUNTER — Telehealth: Payer: Self-pay | Admitting: Plastic Surgery

## 2021-03-21 NOTE — Telephone Encounter (Signed)
Faxed prescription back to [facility] with NPI# and ICD Dx code.  Along with copy of recent office notes.    Confirmation received and copy scanned into the chart.   Patient aware.//AB/CMA

## 2021-03-21 NOTE — Telephone Encounter (Signed)
Patient called to request provider NPI & ICD dx code be added to prescription for prosthetic bras and refax to [facility]. Please call patient when done.

## 2021-03-24 ENCOUNTER — Inpatient Hospital Stay (HOSPITAL_BASED_OUTPATIENT_CLINIC_OR_DEPARTMENT_OTHER): Payer: No Typology Code available for payment source | Admitting: Oncology

## 2021-03-24 ENCOUNTER — Inpatient Hospital Stay: Payer: No Typology Code available for payment source

## 2021-03-24 ENCOUNTER — Encounter: Payer: Self-pay | Admitting: Oncology

## 2021-03-24 VITALS — BP 134/86 | HR 81 | Temp 97.6°F | Resp 16 | Wt 193.0 lb

## 2021-03-24 DIAGNOSIS — Z170 Estrogen receptor positive status [ER+]: Secondary | ICD-10-CM | POA: Diagnosis not present

## 2021-03-24 DIAGNOSIS — K625 Hemorrhage of anus and rectum: Secondary | ICD-10-CM

## 2021-03-24 DIAGNOSIS — Z5111 Encounter for antineoplastic chemotherapy: Secondary | ICD-10-CM | POA: Insufficient documentation

## 2021-03-24 DIAGNOSIS — C50512 Malignant neoplasm of lower-outer quadrant of left female breast: Secondary | ICD-10-CM

## 2021-03-24 DIAGNOSIS — Z5112 Encounter for antineoplastic immunotherapy: Secondary | ICD-10-CM | POA: Diagnosis not present

## 2021-03-24 LAB — COMPREHENSIVE METABOLIC PANEL
ALT: 64 U/L — ABNORMAL HIGH (ref 0–44)
AST: 31 U/L (ref 15–41)
Albumin: 3.9 g/dL (ref 3.5–5.0)
Alkaline Phosphatase: 77 U/L (ref 38–126)
Anion gap: 8 (ref 5–15)
BUN: 18 mg/dL (ref 6–20)
CO2: 23 mmol/L (ref 22–32)
Calcium: 8.7 mg/dL — ABNORMAL LOW (ref 8.9–10.3)
Chloride: 105 mmol/L (ref 98–111)
Creatinine, Ser: 0.87 mg/dL (ref 0.44–1.00)
GFR, Estimated: >60 mL/min (ref >60)
Glucose, Bld: 105 mg/dL — ABNORMAL HIGH (ref 70–99)
Potassium: 3.9 mmol/L (ref 3.5–5.1)
Sodium: 136 mmol/L (ref 135–145)
Total Bilirubin: 0.7 mg/dL (ref 0.3–1.2)
Total Protein: 6.6 g/dL (ref 6.5–8.1)

## 2021-03-24 LAB — CBC WITH DIFFERENTIAL/PLATELET
Abs Immature Granulocytes: 0.08 10*3/uL — ABNORMAL HIGH (ref 0.00–0.07)
Basophils Absolute: 0 10*3/uL (ref 0.0–0.1)
Basophils Relative: 1 %
Eosinophils Absolute: 0.2 10*3/uL (ref 0.0–0.5)
Eosinophils Relative: 3 %
HCT: 35.4 % — ABNORMAL LOW (ref 36.0–46.0)
Hemoglobin: 11.6 g/dL — ABNORMAL LOW (ref 12.0–15.0)
Immature Granulocytes: 2 %
Lymphocytes Relative: 30 %
Lymphs Abs: 1.5 10*3/uL (ref 0.7–4.0)
MCH: 27.2 pg (ref 26.0–34.0)
MCHC: 32.8 g/dL (ref 30.0–36.0)
MCV: 83.1 fL (ref 80.0–100.0)
Monocytes Absolute: 0.3 10*3/uL (ref 0.1–1.0)
Monocytes Relative: 6 %
Neutro Abs: 2.9 10*3/uL (ref 1.7–7.7)
Neutrophils Relative %: 58 %
Platelets: 298 10*3/uL (ref 150–400)
RBC: 4.26 MIL/uL (ref 3.87–5.11)
RDW: 14.6 % (ref 11.5–15.5)
WBC: 5 10*3/uL (ref 4.0–10.5)
nRBC: 0 % (ref 0.0–0.2)

## 2021-03-24 MED ORDER — SODIUM CHLORIDE 0.9 % IV SOLN
80.0000 mg/m2 | Freq: Once | INTRAVENOUS | Status: AC
  Administered 2021-03-24: 162 mg via INTRAVENOUS
  Filled 2021-03-24: qty 27

## 2021-03-24 MED ORDER — FAMOTIDINE 20 MG IN NS 100 ML IVPB
20.0000 mg | Freq: Once | INTRAVENOUS | Status: AC
  Administered 2021-03-24: 20 mg via INTRAVENOUS
  Filled 2021-03-24: qty 20

## 2021-03-24 MED ORDER — HEPARIN SOD (PORK) LOCK FLUSH 100 UNIT/ML IV SOLN
INTRAVENOUS | Status: AC
  Filled 2021-03-24: qty 5

## 2021-03-24 MED ORDER — DIPHENHYDRAMINE HCL 50 MG/ML IJ SOLN
50.0000 mg | Freq: Once | INTRAMUSCULAR | Status: AC
  Administered 2021-03-24: 50 mg via INTRAVENOUS
  Filled 2021-03-24: qty 1

## 2021-03-24 MED ORDER — HEPARIN SOD (PORK) LOCK FLUSH 100 UNIT/ML IV SOLN
500.0000 [IU] | Freq: Once | INTRAVENOUS | Status: AC | PRN
  Administered 2021-03-24: 500 [IU]
  Filled 2021-03-24: qty 5

## 2021-03-24 MED ORDER — ACETAMINOPHEN 325 MG PO TABS
650.0000 mg | ORAL_TABLET | Freq: Once | ORAL | Status: AC
  Administered 2021-03-24: 650 mg via ORAL
  Filled 2021-03-24: qty 2

## 2021-03-24 MED ORDER — TRASTUZUMAB-ANNS CHEMO 150 MG IV SOLR
2.0000 mg/kg | Freq: Once | INTRAVENOUS | Status: AC
  Administered 2021-03-24: 168 mg via INTRAVENOUS
  Filled 2021-03-24: qty 8

## 2021-03-24 MED ORDER — SODIUM CHLORIDE 0.9 % IV SOLN
Freq: Once | INTRAVENOUS | Status: AC
  Filled 2021-03-24: qty 250

## 2021-03-24 MED ORDER — SODIUM CHLORIDE 0.9 % IV SOLN
20.0000 mg | Freq: Once | INTRAVENOUS | Status: AC
  Administered 2021-03-24: 20 mg via INTRAVENOUS
  Filled 2021-03-24: qty 20

## 2021-03-24 NOTE — Progress Notes (Signed)
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]; negative margins s/p simple mastectomy with plan for immediate reconstruction [Dr.Rodenberg/[name]]; NO RT  # May 2nd, 2022- Taxol-Herceptin  # MUGA scan- 64% [03/06/2021]-   # LMP- mid 2020; Uterine ablation- [phone]; # HTN; Asthma- well controlled [on allergy shots];Marland Kitchen    Malignant neoplasm of lower-outer quadrant of left breast of female, estrogen receptor positive ([place])  12/25/2020 Initial Diagnosis   Malignant neoplasm of lower-outer quadrant of left breast of female, estrogen receptor positive ([place])   02/26/2021 Cancer Staging   Staging form: Breast, AJCC 8th Edition - Pathologic: Stage IA (pT1c, pN0, cM0, G3, ER+, PR+, HER2+) - Signed by Cammie Sickle, MD on 02/26/2021 Mitotic count score: Score 3 Histologic grading system: 3 grade system   03/10/2021 -  Chemotherapy    Patient is on Treatment Plan: BREAST PACLITAXEL + TRASTUZUMAB Q7D / TRASTUZUMAB Q21D         HISTORY OF PRESENTING ILLNESS:  Judy Padilla 55 y.o.  female newly diagnosed breast cancer is here for follow-up/proceed with adjuvant chemotherapy. Patient reports tolerating chemotherapy well.  Denies numbness or tingling.  No nausea vomiting diarrhea. She has had rectal bleeding which she attributes to hemorrhoid bleeding. No fever, chills,  Review of Systems  Constitutional: Negative for chills, fever, malaise/fatigue and weight loss.  HENT: Negative for sore throat.   Eyes: Negative for redness.  Respiratory: Negative for cough, shortness of breath and wheezing.   Cardiovascular: Negative for chest pain, palpitations and leg swelling.  Gastrointestinal: Positive for blood in stool. Negative for abdominal pain, nausea  and vomiting.  Genitourinary: Negative for dysuria.  Musculoskeletal: Negative for myalgias.  Skin: Negative for rash.  Neurological: Negative for dizziness, tingling and tremors.  Endo/Heme/Allergies: Does not bruise/bleed easily.  Psychiatric/Behavioral: Negative for hallucinations.     MEDICAL HISTORY:  Past Medical History:  Diagnosis Date  . Asthma    well controlled  . Breast cancer ([place])   . Family history of adverse reaction to anesthesia    sister-hard time waking up  . GERD (gastroesophageal reflux disease)   . Hypertension     SURGICAL HISTORY: Past Surgical History:  Procedure Laterality Date  . ABLATION    . BREAST BIOPSY Left 2011   Benign per pt  . BREAST BIOPSY Left 12/12/2020   3:30 3 cmfn, Q marker, path pending  . BREAST BIOPSY Left 12/12/2020   3:30 1 cmfn, Vision marker, path pending  . BREAST RECONSTRUCTION WITH PLACEMENT OF TISSUE EXPANDER AND FLEX HD (ACELLULAR HYDRATED DERMIS) Left 02/10/2021   Procedure: IMMEDIATE LEFT BREAST RECONSTRUCTION WITH PLACEMENT OF TISSUE EXPANDER AND FLEX HD (ACELLULAR HYDRATED DERMIS);  Surgeon: Wallace Going, DO;  Location: [facility] OR[facility];  Service: Plastics;  Laterality: Left;  . COLONOSCOPY  01/15/2020  . PORTACATH PLACEMENT Right 02/10/2021   Procedure: INSERTION PORT-A-CATH;  Surgeon: Ronny Bacon, MD;  Location: [facility];  Service: General;  Laterality: Right;  . SIMPLE MASTECTOMY WITH AXILLARY SENTINEL NODE BIOPSY Left 02/10/2021   Procedure: SIMPLE MASTECTOMY WITH AXILLARY SENTINEL NODE BIOPSY;  Surgeon: Ronny Bacon, MD;  Location: [facility];  Service: General;  Laterality: Left;    SOCIAL HISTORY: Social History   Socioeconomic History  . Marital status: Married    Spouse name: Not on file  .  Number of children: Not on file  . Years of education: Not on file  . Highest education level: Not on file  Occupational History  . Not on file  Tobacco Use  . Smoking status: Never Smoker  . Smokeless  tobacco: Never Used  Vaping Use  . Vaping Use: Never used  Substance and Sexual Activity  . Alcohol use: Not Currently  . Drug use: Never  . Sexual activity: Not on file  Other Topics Concern  . Not on file  Social History Narrative   Lives in [place] with husband; kids- college. Works for [employer]- working from home. No smoking or alcohol.    Social Determinants of Health   Financial Resource Strain: Not on file  Food Insecurity: Not on file  Transportation Needs: Not on file  Physical Activity: Not on file  Stress: Not on file  Social Connections: Not on file  Intimate Partner Violence: Not on file    FAMILY HISTORY: Family History  Problem Relation Age of Onset  . Hypertension Mother   . Diabetes Mother   . Hypertension Father   . Diabetes Father   . Cancer Father        prostate cancer-70s  . Cancer Maternal Grandmother        uterine cancer  . Breast cancer Sister        in in 35s.     ALLERGIES:  is allergic to shrimp extract allergy skin test and other.  MEDICATIONS:  Current Outpatient Medications  Medication Sig Dispense Refill  . albuterol (VENTOLIN HFA) 108 (90 Base) MCG/ACT inhaler Inhale 1 puff into the lungs every 6 (six) hours as needed for shortness of breath.    Marland Kitchen amLODipine (NORVASC) 10 MG tablet Take 10 mg by mouth every morning.    Marland Kitchen atorvastatin (LIPITOR) 80 MG tablet Take 80 mg by mouth every morning.    . budesonide (PULMICORT) 0.5 MG/2ML nebulizer solution Take 2 mLs by nebulization daily.    . cetirizine (ZYRTEC) 10 MG tablet Take 10 mg by mouth daily as needed for allergies.    . Cholecalciferol 1.25 MG (50000 UT) capsule Take 50,000 Units by mouth once a week.    . diazepam (VALIUM) 2 MG tablet Take 1 tablet (2 mg total) by mouth every 12 (twelve) hours as needed for muscle spasms. 20 tablet 0  . fluticasone (FLONASE) 50 MCG/ACT nasal spray Place 1 spray into both nostrils daily as needed for allergies.    .  fluticasone furoate-vilanterol (BREO ELLIPTA) 100-25 MCG/INH AEPB Inhale 1 puff into the lungs as needed for shortness of breath.    . GNP BUDESONIDE NASAL SPRAY NA Place 1 spray into the nose daily.    Marland Kitchen lidocaine-prilocaine (EMLA) cream Apply 30 -45 mins prior to port access. 30 g 0  . Multiple Vitamins-Minerals (CENTRUM ADULTS) TABS Take 1 tablet by mouth daily.    . NON FORMULARY Takes weekly allergy shots    . pantoprazole (PROTONIX) 40 MG tablet Take 40 mg by mouth every morning.    Marland Kitchen EPINEPHrine 0.3 mg/0.3 mL IJ SOAJ injection Inject 0.3 mg into the muscle as needed for anaphylaxis. (Patient not taking: No sig reported)    . naproxen sodium (ALEVE) 220 MG tablet Take 220 mg by mouth daily as needed (knee pain). (Patient not taking: Reported on 03/24/2021)    . ondansetron (ZOFRAN) 4 MG tablet Take 1 tablet (4 mg total) by mouth every 8 (eight) hours as needed for nausea or vomiting. (  Patient not taking: No sig reported) 20 tablet 0  . ondansetron (ZOFRAN) 8 MG tablet One pill every 8 hours as needed for nausea/vomitting. (Patient not taking: Reported on 03/24/2021) 40 tablet 1  . prochlorperazine (COMPAZINE) 10 MG tablet Take 1 tablet (10 mg total) by mouth every 6 (six) hours as needed for nausea or vomiting. (Patient not taking: Reported on 03/24/2021) 40 tablet 1   No current facility-administered medications for this visit.      Marland Kitchen  PHYSICAL EXAMINATION: ECOG PERFORMANCE STATUS: 0 - Asymptomatic  Vitals:   03/24/21 0845  BP: 134/86  Pulse: 81  Resp: 16  Temp: 97.6 F (36.4 C)  SpO2: 100%   Filed Weights   03/24/21 0845  Weight: 193 lb (87.5 kg)    Physical Exam Constitutional:      Comments: Ambulating independently.  HENT:     Head: Normocephalic and atraumatic.     Mouth/Throat:     Pharynx: No oropharyngeal exudate.  Eyes:     Pupils: Pupils are equal, round, and reactive to light.  Cardiovascular:     Rate and Rhythm: Normal rate and regular rhythm.   Pulmonary:     Effort: Pulmonary effort is normal. No respiratory distress.     Breath sounds: Normal breath sounds. No wheezing.  Abdominal:     General: Bowel sounds are normal. There is no distension.     Palpations: Abdomen is soft. There is no mass.     Tenderness: There is no abdominal tenderness. There is no guarding or rebound.  Musculoskeletal:        General: No tenderness. Normal range of motion.     Cervical back: Normal range of motion and neck supple.  Skin:    General: Skin is warm.  Neurological:     Mental Status: She is alert and oriented to person, place, and time.  Psychiatric:        Mood and Affect: Affect normal.      LABORATORY DATA:  I have reviewed the data as listed Lab Results  Component Value Date   WBC 5.0 03/24/2021   HGB 11.6 (L) 03/24/2021   HCT 35.4 (L) 03/24/2021   MCV 83.1 03/24/2021   PLT 298 03/24/2021   Recent Labs    03/10/21 0902 03/17/21 0823 03/24/21 0801  NA 141 139 136  K 4.0 4.1 3.9  CL 107 107 105  CO2 _0 GLUCOSE 99 105* 105*  BUN 18 21* 18  CREATININE 0.81 0.94 0.87  CALCIUM 9.2 9.0 8.7*  GFRNONAA >60 >60 >60  PROT 7.2 6.4* 6.6  ALBUMIN 4.2 3.8 3.9  AST 26 41 31  ALT 40 80* 64*  ALKPHOS 91 76 77  BILITOT 0.8 0.8 0.7    RADIOGRAPHIC STUDIES: I have personally reviewed the radiological images as listed and agreed with the findings in the report. NM Cardiac [facility] Rest  Result Date: 03/06/2021 CLINICAL DATA:  Breast cancer. Evaluate cardiac function in relation to chemotherapy. EXAM: NUCLEAR MEDICINE CARDIAC BLOOD POOL IMAGING (MUGA) TECHNIQUE: Cardiac multi-gated acquisition was performed at rest following intravenous injection of Tc-27mlabeled red blood cells. RADIOPHARMACEUTICALS:  21.2 mCi Tc-981mertechnetate in-vitro labeled red blood cells IV COMPARISON:  None. FINDINGS: No  focal wall motion abnormality of the left ventricle. Calculated left ventricular ejection fraction equals 64% IMPRESSION: Left  ventricular ejection fraction equals64 %. Electronically Signed   By: StSuzy Bouchard.D.   On: 03/06/2021 08:19    ASSESSMENT & PLAN:  1. Malignant neoplasm of lower-outer quadrant of left breast of female, estrogen receptor positive ([place])   2. Encounter for antineoplastic chemotherapy   3. Rectal bleeding    Cancer Staging Malignant neoplasm of lower-outer quadrant of left breast of female, estrogen receptor positive ([place]) Staging form: Breast, AJCC 8th Edition - Pathologic: Stage IA (pT1c, pN0, cM0, G3, ER+, PR+, HER2+) - Signed by Cammie Sickle, MD on 02/26/2021  #Left breast stage IA ER/PR positive, HER2/neu positive. Currently on adjuvant chemotherapy with weekly Taxol and trastuzumab. Overall she tolerates well.  Labs reviewed and discussed with patient. Proceed with cycle 1 day 15 Taxol and trastuzumab today.  She is going to repeat blood work in 1 week if within parameters, she will proceed with cycle 1 day 22 Taxol and trastuzumab  next week.  #Intermittent rectal bleeding, normal platelet count and stable hemoglobin.  Continue monitor.  Keep stools soft.  If persistent symptoms, need to refer to gastroenterology.  Defer to Dr. Rogue Bussing  1 week lab Taxol and trastuzumab.  2 weeks lab MD Taxol and trastuzumab-Dr. Rogue Bussing All questions were answered. The patient/family knows to call the clinic with any problems, questions or concerns.    Earlie Server, MD 03/24/2021 7:39 PM

## 2021-03-24 NOTE — Progress Notes (Signed)
Patient here for oncology follow-up appointment, expresses concerns of rectal bleeding

## 2021-03-24 NOTE — Patient Instructions (Signed)
[facility]  Discharge Instructions: Thank you for choosing [facility] to provide your oncology and hematology care.  If you have a lab appointment with the Cancer Center, please go directly to the Cancer Center and check in at the registration area.  Wear comfortable clothing and clothing appropriate for easy access to any Portacath or PICC line.   We strive to give you quality time with your provider. You may need to reschedule your appointment if you arrive late (15 or more minutes).  Arriving late affects you and other patients whose appointments are after yours.  Also, if you miss three or more appointments without notifying the office, you may be dismissed from the clinic at the provider's discretion.      For prescription refill requests, have your pharmacy contact our office and allow 72 hours for refills to be completed.    Today you received the following chemotherapy and/or immunotherapy agents : Herceptin : Taxol    To help prevent nausea and vomiting after your treatment, we encourage you to take your nausea medication as directed.  BELOW ARE SYMPTOMS THAT SHOULD BE REPORTED IMMEDIATELY: *FEVER GREATER THAN 100.4 F (38 C) OR HIGHER *CHILLS OR SWEATING *NAUSEA AND VOMITING THAT IS NOT CONTROLLED WITH YOUR NAUSEA MEDICATION *UNUSUAL SHORTNESS OF BREATH *UNUSUAL BRUISING OR BLEEDING *URINARY PROBLEMS (pain or burning when urinating, or frequent urination) *BOWEL PROBLEMS (unusual diarrhea, constipation, pain near the anus) TENDERNESS IN MOUTH AND THROAT WITH OR WITHOUT PRESENCE OF ULCERS (sore throat, sores in mouth, or a toothache) UNUSUAL RASH, SWELLING OR PAIN  UNUSUAL VAGINAL DISCHARGE OR ITCHING   Items with * indicate a potential emergency and should be followed up as soon as possible or go to the Emergency Department if any problems should occur.  Please show the CHEMOTHERAPY ALERT CARD or IMMUNOTHERAPY ALERT CARD at  check-in to the Emergency Department and triage nurse.  Should you have questions after your visit or need to cancel or reschedule your appointment, please contact [facility]  [phone] and follow the prompts.  Office hours are 8:00 a.m. to 4:30 p.m. Monday - Friday. Please note that voicemails left after 4:00 p.m. may not be returned until the following business day.  We are closed weekends and major holidays. You have access to a nurse at all times for urgent questions. Please call the main number to the clinic [phone] and follow the prompts.  For any non-urgent questions, you may also contact your provider using MyChart. We now offer e-Visits for anyone 18 and older to request care online for non-urgent symptoms. For details visit [email or web address].   Also download the MyChart app! Go to the app store, search "MyChart", open the app, select [facility], and log in with your MyChart username and password.  Due to Covid, a mask is required upon entering the hospital/clinic. If you do not have a mask, one will be given to you upon arrival. For doctor visits, patients may have 1 support person aged 18 or older with them. For treatment visits, patients cannot have anyone with them due to current Covid guidelines and our immunocompromised population.  

## 2021-03-25 ENCOUNTER — Inpatient Hospital Stay: Payer: No Typology Code available for payment source

## 2021-03-25 ENCOUNTER — Inpatient Hospital Stay: Payer: No Typology Code available for payment source | Admitting: Licensed Clinical Social Worker

## 2021-03-27 DIAGNOSIS — J301 Allergic rhinitis due to pollen: Secondary | ICD-10-CM | POA: Diagnosis not present

## 2021-03-29 DIAGNOSIS — M170 Bilateral primary osteoarthritis of knee: Secondary | ICD-10-CM | POA: Diagnosis not present

## 2021-03-31 ENCOUNTER — Inpatient Hospital Stay: Payer: No Typology Code available for payment source

## 2021-03-31 ENCOUNTER — Other Ambulatory Visit: Payer: Self-pay

## 2021-03-31 VITALS — BP 130/78 | HR 78 | Temp 95.5°F | Resp 20 | Wt 192.0 lb

## 2021-03-31 DIAGNOSIS — Z5112 Encounter for antineoplastic immunotherapy: Secondary | ICD-10-CM | POA: Diagnosis not present

## 2021-03-31 DIAGNOSIS — Z170 Estrogen receptor positive status [ER+]: Secondary | ICD-10-CM

## 2021-03-31 DIAGNOSIS — C50512 Malignant neoplasm of lower-outer quadrant of left female breast: Secondary | ICD-10-CM

## 2021-03-31 LAB — COMPREHENSIVE METABOLIC PANEL
ALT: 47 U/L — ABNORMAL HIGH (ref 0–44)
AST: 20 U/L (ref 15–41)
Albumin: 4 g/dL (ref 3.5–5.0)
Alkaline Phosphatase: 78 U/L (ref 38–126)
Anion gap: 9 (ref 5–15)
BUN: 20 mg/dL (ref 6–20)
CO2: 22 mmol/L (ref 22–32)
Calcium: 8.8 mg/dL — ABNORMAL LOW (ref 8.9–10.3)
Chloride: 107 mmol/L (ref 98–111)
Creatinine, Ser: 0.95 mg/dL (ref 0.44–1.00)
GFR, Estimated: >60 mL/min (ref >60)
Glucose, Bld: 103 mg/dL — ABNORMAL HIGH (ref 70–99)
Potassium: 3.8 mmol/L (ref 3.5–5.1)
Sodium: 138 mmol/L (ref 135–145)
Total Bilirubin: 0.6 mg/dL (ref 0.3–1.2)
Total Protein: 6.7 g/dL (ref 6.5–8.1)

## 2021-03-31 LAB — CBC WITH DIFFERENTIAL/PLATELET
Abs Immature Granulocytes: 0.13 10*3/uL — ABNORMAL HIGH (ref 0.00–0.07)
Basophils Absolute: 0 10*3/uL (ref 0.0–0.1)
Basophils Relative: 1 %
Eosinophils Absolute: 0 10*3/uL (ref 0.0–0.5)
Eosinophils Relative: 1 %
HCT: 34.5 % — ABNORMAL LOW (ref 36.0–46.0)
Hemoglobin: 11.2 g/dL — ABNORMAL LOW (ref 12.0–15.0)
Immature Granulocytes: 3 %
Lymphocytes Relative: 35 %
Lymphs Abs: 1.8 10*3/uL (ref 0.7–4.0)
MCH: 26.9 pg (ref 26.0–34.0)
MCHC: 32.5 g/dL (ref 30.0–36.0)
MCV: 82.7 fL (ref 80.0–100.0)
Monocytes Absolute: 0.5 10*3/uL (ref 0.1–1.0)
Monocytes Relative: 10 %
Neutro Abs: 2.7 10*3/uL (ref 1.7–7.7)
Neutrophils Relative %: 50 %
Platelets: 353 10*3/uL (ref 150–400)
RBC: 4.17 MIL/uL (ref 3.87–5.11)
RDW: 15.2 % (ref 11.5–15.5)
WBC: 5.3 10*3/uL (ref 4.0–10.5)
nRBC: 0 % (ref 0.0–0.2)

## 2021-03-31 MED ORDER — TRASTUZUMAB-ANNS CHEMO 150 MG IV SOLR
2.0000 mg/kg | Freq: Once | INTRAVENOUS | Status: AC
  Administered 2021-03-31: 168 mg via INTRAVENOUS
  Filled 2021-03-31: qty 8

## 2021-03-31 MED ORDER — DIPHENHYDRAMINE HCL 50 MG/ML IJ SOLN
50.0000 mg | Freq: Once | INTRAMUSCULAR | Status: AC
  Administered 2021-03-31: 50 mg via INTRAVENOUS
  Filled 2021-03-31: qty 1

## 2021-03-31 MED ORDER — HEPARIN SOD (PORK) LOCK FLUSH 100 UNIT/ML IV SOLN
500.0000 [IU] | Freq: Once | INTRAVENOUS | Status: AC
  Administered 2021-03-31: 500 [IU] via INTRAVENOUS
  Filled 2021-03-31: qty 5

## 2021-03-31 MED ORDER — SODIUM CHLORIDE 0.9 % IV SOLN
80.0000 mg/m2 | Freq: Once | INTRAVENOUS | Status: AC
  Administered 2021-03-31: 162 mg via INTRAVENOUS
  Filled 2021-03-31: qty 27

## 2021-03-31 MED ORDER — SODIUM CHLORIDE 0.9% FLUSH
10.0000 mL | Freq: Once | INTRAVENOUS | Status: AC
  Administered 2021-03-31: 10 mL via INTRAVENOUS
  Filled 2021-03-31: qty 10

## 2021-03-31 MED ORDER — SODIUM CHLORIDE 0.9 % IV SOLN
20.0000 mg | Freq: Once | INTRAVENOUS | Status: AC
  Administered 2021-03-31: 20 mg via INTRAVENOUS
  Filled 2021-03-31: qty 20

## 2021-03-31 MED ORDER — SODIUM CHLORIDE 0.9 % IV SOLN
Freq: Once | INTRAVENOUS | Status: AC
Start: 2021-03-31 — End: 2021-03-31
  Filled 2021-03-31: qty 250

## 2021-03-31 MED ORDER — ACETAMINOPHEN 325 MG PO TABS
650.0000 mg | ORAL_TABLET | Freq: Once | ORAL | Status: AC
  Administered 2021-03-31: 650 mg via ORAL
  Filled 2021-03-31: qty 2

## 2021-03-31 MED ORDER — FAMOTIDINE 20 MG IN NS 100 ML IVPB
20.0000 mg | Freq: Once | INTRAVENOUS | Status: AC
  Administered 2021-03-31: 20 mg via INTRAVENOUS
  Filled 2021-03-31: qty 20

## 2021-03-31 NOTE — Patient Instructions (Signed)
[facility]  Discharge Instructions: Thank you for choosing [facility] to provide your oncology and hematology care.  If you have a lab appointment with the [facility], please go directly to the [facility] and check in at the registration area.  Wear comfortable clothing and clothing appropriate for easy access to any Portacath or PICC line.   We strive to give you quality time with your provider. You may need to reschedule your appointment if you arrive late (15 or more minutes).  Arriving late affects you and other patients whose appointments are after yours.  Also, if you miss three or more appointments without notifying the office, you may be dismissed from the clinic at the provider's discretion.      For prescription refill requests, have your pharmacy contact our office and allow 72 hours for refills to be completed.    Today you received the following chemotherapy and/or immunotherapy agents Kanjinti, Taxol      To help prevent nausea and vomiting after your treatment, we encourage you to take your nausea medication as directed.  BELOW ARE SYMPTOMS THAT SHOULD BE REPORTED IMMEDIATELY: . *FEVER GREATER THAN 100.4 F (38 C) OR HIGHER . *CHILLS OR SWEATING . *NAUSEA AND VOMITING THAT IS NOT CONTROLLED WITH YOUR NAUSEA MEDICATION . *UNUSUAL SHORTNESS OF BREATH . *UNUSUAL BRUISING OR BLEEDING . *URINARY PROBLEMS (pain or burning when urinating, or frequent urination) . *BOWEL PROBLEMS (unusual diarrhea, constipation, pain near the anus) . TENDERNESS IN MOUTH AND THROAT WITH OR WITHOUT PRESENCE OF ULCERS (sore throat, sores in mouth, or a toothache) . UNUSUAL RASH, SWELLING OR PAIN  . UNUSUAL VAGINAL DISCHARGE OR ITCHING   Items with * indicate a potential emergency and should be followed up as soon as possible or go to the Emergency Department if any problems should occur.  Please show the CHEMOTHERAPY ALERT CARD or  IMMUNOTHERAPY ALERT CARD at check-in to the Emergency Department and triage nurse.  Should you have questions after your visit or need to cancel or reschedule your appointment, please contact [facility]  [phone] and follow the prompts.  Office hours are 8:00 a.m. to 4:30 p.m. Monday - Friday. Please note that voicemails left after 4:00 p.m. may not be returned until the following business day.  We are closed weekends and major holidays. You have access to a nurse at all times for urgent questions. Please call the main number to the clinic [phone] and follow the prompts.  For any non-urgent questions, you may also contact your provider using MyChart. We now offer e-Visits for anyone 96 and older to request care online for non-urgent symptoms. For details visit [email or web address].   Also download the MyChart app! Go to the app store, search "MyChart", open the app, select [facility], and log in with your MyChart username and password.  Due to Covid, a mask is required upon entering the hospital/clinic. If you do not have a mask, one will be given to you upon arrival. For doctor visits, patients may have 1 support person aged 95 or older with them. For treatment visits, patients cannot have anyone with them due to current Covid guidelines and our immunocompromised population. Trastuzumab injection for infusion What is this medicine? TRASTUZUMAB (tras TOO zoo mab) is a monoclonal antibody. It is used to treat breast cancer and stomach cancer. This medicine may be used for other purposes; ask your health care provider or pharmacist if you have questions.  COMMON BRAND NAME(S): Herceptin, Galvin Proffer, Trazimera What should I tell my health care provider before I take this medicine? They need to know if you have any of these conditions:  heart disease  heart failure  lung or breathing disease, like asthma  an unusual or  allergic reaction to trastuzumab, benzyl alcohol, or other medications, foods, dyes, or preservatives  pregnant or trying to get pregnant  breast-feeding How should I use this medicine? This drug is given as an infusion into a vein. It is administered in a hospital or clinic by a specially trained health care professional. Talk to your pediatrician regarding the use of this medicine in children. This medicine is not approved for use in children. Overdosage: If you think you have taken too much of this medicine contact a poison control center or emergency room at once. NOTE: This medicine is only for you. Do not share this medicine with others. What if I miss a dose? It is important not to miss a dose. Call your doctor or health care professional if you are unable to keep an appointment. What may interact with this medicine? This medicine may interact with the following medications:  certain types of chemotherapy, such as daunorubicin, doxorubicin, epirubicin, and idarubicin This list may not describe all possible interactions. Give your health care provider a list of all the medicines, herbs, non-prescription drugs, or dietary supplements you use. Also tell them if you smoke, drink alcohol, or use illegal drugs. Some items may interact with your medicine. What should I watch for while using this medicine? Visit your doctor for checks on your progress. Report any side effects. Continue your course of treatment even though you feel ill unless your doctor tells you to stop. Call your doctor or health care professional for advice if you get a fever, chills or sore throat, or other symptoms of a cold or flu. Do not treat yourself. Try to avoid being around people who are sick. You may experience fever, chills and shaking during your first infusion. These effects are usually mild and can be treated with other medicines. Report any side effects during the infusion to your health care professional. Fever  and chills usually do not happen with later infusions. Do not become pregnant while taking this medicine or for 7 months after stopping it. Women should inform their doctor if they wish to become pregnant or think they might be pregnant. Women of child-bearing potential will need to have a negative pregnancy test before starting this medicine. There is a potential for serious side effects to an unborn child. Talk to your health care professional or pharmacist for more information. Do not breast-feed an infant while taking this medicine or for 7 months after stopping it. Women must use effective birth control with this medicine. What side effects may I notice from receiving this medicine? Side effects that you should report to your doctor or health care professional as soon as possible:  allergic reactions like skin rash, itching or hives, swelling of the face, lips, or tongue  chest pain or palpitations  cough  dizziness  feeling faint or lightheaded, falls  fever  general ill feeling or flu-like symptoms  signs of worsening heart failure like breathing problems; swelling in your legs and feet  unusually weak or tired Side effects that usually do not require medical attention (report to your doctor or health care professional if they continue or are bothersome):  bone pain  changes in taste  diarrhea  joint pain  nausea/vomiting  weight loss This list may not describe all possible side effects. Call your doctor for medical advice about side effects. You may report side effects to FDA at 1-800-FDA-1088. Where should I keep my medicine? This drug is given in a hospital or clinic and will not be stored at home. NOTE: This sheet is a summary. It may not cover all possible information. If you have questions about this medicine, talk to your doctor, pharmacist, or health care provider.  2021 Elsevier/Gold Standard (2016-10-20 14:37:52) Paclitaxel injection What is this  medicine? PACLITAXEL (PAK li TAX el) is a chemotherapy drug. It targets fast dividing cells, like cancer cells, and causes these cells to die. This medicine is used to treat ovarian cancer, breast cancer, lung cancer, Kaposi's sarcoma, and other cancers. This medicine may be used for other purposes; ask your health care provider or pharmacist if you have questions. COMMON BRAND NAME(S): Onxol, Taxol What should I tell my health care provider before I take this medicine? They need to know if you have any of these conditions:  history of irregular heartbeat  liver disease  low blood counts, like low white cell, platelet, or red cell counts  lung or breathing disease, like asthma  tingling of the fingers or toes, or other nerve disorder  an unusual or allergic reaction to paclitaxel, alcohol, polyoxyethylated castor oil, other chemotherapy, other medicines, foods, dyes, or preservatives  pregnant or trying to get pregnant  breast-feeding How should I use this medicine? This drug is given as an infusion into a vein. It is administered in a hospital or clinic by a specially trained health care professional. Talk to your pediatrician regarding the use of this medicine in children. Special care may be needed. Overdosage: If you think you have taken too much of this medicine contact a poison control center or emergency room at once. NOTE: This medicine is only for you. Do not share this medicine with others. What if I miss a dose? It is important not to miss your dose. Call your doctor or health care professional if you are unable to keep an appointment. What may interact with this medicine? Do not take this medicine with any of the following medications:  live virus vaccines This medicine may also interact with the following medications:  antiviral medicines for hepatitis, HIV or AIDS  certain antibiotics like erythromycin and clarithromycin  certain medicines for fungal infections  like ketoconazole and itraconazole  certain medicines for seizures like carbamazepine, phenobarbital, phenytoin  gemfibrozil  nefazodone  rifampin  St. John's wort This list may not describe all possible interactions. Give your health care provider a list of all the medicines, herbs, non-prescription drugs, or dietary supplements you use. Also tell them if you smoke, drink alcohol, or use illegal drugs. Some items may interact with your medicine. What should I watch for while using this medicine? Your condition will be monitored carefully while you are receiving this medicine. You will need important blood work done while you are taking this medicine. This medicine can cause serious allergic reactions. To reduce your risk you will need to take other medicine(s) before treatment with this medicine. If you experience allergic reactions like skin rash, itching or hives, swelling of the face, lips, or tongue, tell your doctor or health care professional right away. In some cases, you may be given additional medicines to help with side effects. Follow all directions for their use. This drug may make you feel generally unwell.  This is not uncommon, as chemotherapy can affect healthy cells as well as cancer cells. Report any side effects. Continue your course of treatment even though you feel ill unless your doctor tells you to stop. Call your doctor or health care professional for advice if you get a fever, chills or sore throat, or other symptoms of a cold or flu. Do not treat yourself. This drug decreases your body's ability to fight infections. Try to avoid being around people who are sick. This medicine may increase your risk to bruise or bleed. Call your doctor or health care professional if you notice any unusual bleeding. Be careful brushing and flossing your teeth or using a toothpick because you may get an infection or bleed more easily. If you have any dental work done, tell your dentist you  are receiving this medicine. Avoid taking products that contain aspirin, acetaminophen, ibuprofen, naproxen, or ketoprofen unless instructed by your doctor. These medicines may hide a fever. Do not become pregnant while taking this medicine. Women should inform their doctor if they wish to become pregnant or think they might be pregnant. There is a potential for serious side effects to an unborn child. Talk to your health care professional or pharmacist for more information. Do not breast-feed an infant while taking this medicine. Men are advised not to father a child while receiving this medicine. This product may contain alcohol. Ask your pharmacist or healthcare provider if this medicine contains alcohol. Be sure to tell all healthcare providers you are taking this medicine. Certain medicines, like metronidazole and disulfiram, can cause an unpleasant reaction when taken with alcohol. The reaction includes flushing, headache, nausea, vomiting, sweating, and increased thirst. The reaction can last from 30 minutes to several hours. What side effects may I notice from receiving this medicine? Side effects that you should report to your doctor or health care professional as soon as possible:  allergic reactions like skin rash, itching or hives, swelling of the face, lips, or tongue  breathing problems  changes in vision  fast, irregular heartbeat  high or low blood pressure  mouth sores  pain, tingling, numbness in the hands or feet  signs of decreased platelets or bleeding - bruising, pinpoint red spots on the skin, black, tarry stools, blood in the urine  signs of decreased red blood cells - unusually weak or tired, feeling faint or lightheaded, falls  signs of infection - fever or chills, cough, sore throat, pain or difficulty passing urine  signs and symptoms of liver injury like dark yellow or brown urine; general ill feeling or flu-like symptoms; light-colored stools; loss of  appetite; nausea; right upper belly pain; unusually weak or tired; yellowing of the eyes or skin  swelling of the ankles, feet, hands  unusually slow heartbeat Side effects that usually do not require medical attention (report to your doctor or health care professional if they continue or are bothersome):  diarrhea  hair loss  loss of appetite  muscle or joint pain  nausea, vomiting  pain, redness, or irritation at site where injected  tiredness This list may not describe all possible side effects. Call your doctor for medical advice about side effects. You may report side effects to FDA at 1-800-FDA-1088. Where should I keep my medicine? This drug is given in a hospital or clinic and will not be stored at home. NOTE: This sheet is a summary. It may not cover all possible information. If you have questions about this medicine, talk to your  doctor, pharmacist, or health care provider.  2021 Elsevier/Gold Standard (2019-09-27 13:37:23)

## 2021-04-03 ENCOUNTER — Inpatient Hospital Stay (HOSPITAL_BASED_OUTPATIENT_CLINIC_OR_DEPARTMENT_OTHER): Payer: No Typology Code available for payment source | Admitting: Licensed Clinical Social Worker

## 2021-04-03 ENCOUNTER — Inpatient Hospital Stay: Payer: No Typology Code available for payment source

## 2021-04-03 ENCOUNTER — Encounter: Payer: Self-pay | Admitting: Licensed Clinical Social Worker

## 2021-04-03 DIAGNOSIS — C50512 Malignant neoplasm of lower-outer quadrant of left female breast: Secondary | ICD-10-CM

## 2021-04-03 DIAGNOSIS — Z8049 Family history of malignant neoplasm of other genital organs: Secondary | ICD-10-CM | POA: Insufficient documentation

## 2021-04-03 DIAGNOSIS — Z803 Family history of malignant neoplasm of breast: Secondary | ICD-10-CM

## 2021-04-03 DIAGNOSIS — E782 Mixed hyperlipidemia: Secondary | ICD-10-CM | POA: Diagnosis not present

## 2021-04-03 DIAGNOSIS — Z170 Estrogen receptor positive status [ER+]: Secondary | ICD-10-CM | POA: Diagnosis not present

## 2021-04-03 DIAGNOSIS — Z8042 Family history of malignant neoplasm of prostate: Secondary | ICD-10-CM | POA: Diagnosis not present

## 2021-04-03 DIAGNOSIS — R7302 Impaired glucose tolerance (oral): Secondary | ICD-10-CM | POA: Diagnosis not present

## 2021-04-03 DIAGNOSIS — J301 Allergic rhinitis due to pollen: Secondary | ICD-10-CM | POA: Diagnosis not present

## 2021-04-03 DIAGNOSIS — I10 Essential (primary) hypertension: Secondary | ICD-10-CM | POA: Diagnosis not present

## 2021-04-03 DIAGNOSIS — C50912 Malignant neoplasm of unspecified site of left female breast: Secondary | ICD-10-CM | POA: Diagnosis not present

## 2021-04-03 DIAGNOSIS — R350 Frequency of micturition: Secondary | ICD-10-CM | POA: Diagnosis not present

## 2021-04-03 NOTE — Progress Notes (Signed)
REFERRING PROVIDER: Cammie Sickle, MD [place],  [place] [address]  PRIMARY PROVIDER:  [name], MD  PRIMARY REASON FOR VISIT:  1. Malignant neoplasm of lower-outer quadrant of left breast of female, estrogen receptor positive ([place])   2. Family history of breast cancer   3. Family history of uterine cancer   4. Family history of prostate cancer      HISTORY OF PRESENT ILLNESS:   Ms. Cochrane, a 55 y.o. female, was seen for a [facility] cancer genetics consultation at the request of Dr. Rogue Bussing due to a personal and family history of cancer.  Ms. Gover presents to clinic today to discuss the possibility of a hereditary predisposition to cancer, genetic testing, and to further clarify her future cancer risks, as well as potential cancer risks for family members.   In 2022, at the age of 65, Ms. Hanssen was diagnosed with left invasive mammary carcinoma, ER/PR/Her2+. She had mastectomy and is on chemotherapy.     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]; negative margins s/p simple mastectomy with plan for immediate reconstruction [Dr.Rodenberg/[name]]; NO RT  # May 2nd, 2022- Taxol-Herceptin  # MUGA scan- 64% [03/06/2021]-   # LMP- mid 2020; Uterine ablation- [phone]; # HTN; Asthma- well controlled [on allergy shots];Marland Kitchen    Malignant neoplasm of lower-outer quadrant of left breast of female, estrogen receptor positive ([place])  12/25/2020 Initial Diagnosis   Malignant neoplasm of lower-outer quadrant of left breast of female, estrogen receptor positive ([place])   02/26/2021 Cancer Staging   Staging form: Breast, AJCC 8th Edition - Pathologic: Stage IA (pT1c, pN0, cM0, G3, ER+, PR+, HER2+) - Signed by Cammie Sickle, MD on 02/26/2021 Mitotic count score: Score 3 Histologic grading system: 3 grade system   03/10/2021 -  Chemotherapy    Patient is on  Treatment Plan: BREAST PACLITAXEL + TRASTUZUMAB Q7D / TRASTUZUMAB Q21D         RISK FACTORS:  Menarche was at age 81.  First live birth at age 74.  OCP use for approximately 1 years.  Ovaries intact: yes.  Hysterectomy: no.  Menopausal status: postmenopausal.  HRT use: 0 years. Colonoscopy: yes; normal. Mammogram within the last year: yes. Number of breast biopsies: 2. Up to date with pelvic exams: yes.   Past Medical History:  Diagnosis Date  . Asthma    well controlled  . Breast cancer ([place])   . Family history of adverse reaction to anesthesia    sister-hard time waking up  . Family history of breast cancer   . Family history of prostate cancer   . Family history of uterine cancer   . GERD (gastroesophageal reflux disease)   . Hypertension     Past Surgical History:  Procedure Laterality Date  . ABLATION    . BREAST BIOPSY Left 2011   Benign per pt  . BREAST BIOPSY Left 12/12/2020   3:30 3 cmfn, Q marker, path pending  . BREAST BIOPSY Left 12/12/2020   3:30 1 cmfn, Vision marker, path pending  . BREAST RECONSTRUCTION WITH PLACEMENT OF TISSUE EXPANDER AND FLEX HD (ACELLULAR HYDRATED DERMIS) Left 02/10/2021   Procedure: IMMEDIATE LEFT BREAST RECONSTRUCTION WITH PLACEMENT OF TISSUE EXPANDER AND FLEX HD (ACELLULAR HYDRATED DERMIS);  Surgeon: Wallace Going, DO;  Location: [facility];  Service: Plastics;  Laterality: Left;  . COLONOSCOPY  01/15/2020  . PORTACATH PLACEMENT Right 02/10/2021  Procedure: INSERTION PORT-A-CATH;  Surgeon: Ronny Bacon, MD;  Location: [facility];  Service: General;  Laterality: Right;  . SIMPLE MASTECTOMY WITH AXILLARY SENTINEL NODE BIOPSY Left 02/10/2021   Procedure: SIMPLE MASTECTOMY WITH AXILLARY SENTINEL NODE BIOPSY;  Surgeon: Ronny Bacon, MD;  Location: [facility];  Service: General;  Laterality: Left;    Social History   Socioeconomic History  . Marital status: Married    Spouse name: Not on file  . Number of children: Not on  file  . Years of education: Not on file  . Highest education level: Not on file  Occupational History  . Not on file  Tobacco Use  . Smoking status: Never Smoker  . Smokeless tobacco: Never Used  Vaping Use  . Vaping Use: Never used  Substance and Sexual Activity  . Alcohol use: Not Currently  . Drug use: Never  . Sexual activity: Not on file  Other Topics Concern  . Not on file  Social History Narrative   Lives in Home with husband; kids- college. Works for [employer]- working from home. No smoking or alcohol.    Social Determinants of Health   Financial Resource Strain: Not on file  Food Insecurity: Not on file  Transportation Needs: Not on file  Physical Activity: Not on file  Stress: Not on file  Social Connections: Not on file     FAMILY HISTORY:  We obtained a detailed, 4-generation family history.  Significant diagnoses are listed below: Family History  Problem Relation Age of Onset  . Hypertension Mother   . Diabetes Mother   . Hypertension Father   . Diabetes Father   . Cancer Father        prostate cancer-70s  . Cancer Maternal Grandmother        uterine cancer  . Breast cancer Sister        in in 15s.    Ms. Jorstad has 1 daughter and two step children. She has 2 full sisters, 1 maternal half brother, 1 maternal half sister, 2 paternal half brothers and 1 paternal half sister. Her paternal half sister had breast cancer in her 64s and is living at 76.  Ms. Adcox mother is living at 36. Patient has limited information about this side of the family. No cancers for aunts/uncles/cousins. Maternal grandmother had uterine cancer and died in her 2s of it. Her grandfather possibly had cancer, died in his 26s.  Ms. Milbrath father had prostate cancer in his 57s, died in his 81s. No cancers for aunts, uncles, cousins, or grandparents on this side.   Ms. Decoste is unaware of previous family history of genetic testing for hereditary cancer  risks. Patient's maternal and paternal sides of the family are from [place]. There is no reported Ashkenazi Jewish ancestry. There is no known consanguinity.    GENETIC COUNSELING ASSESSMENT: Ms. Tregre is a 55 y.o. female with a personal and family history which is somewhat suggestive of a hereditary cancer syndrome and predisposition to cancer. We, therefore, discussed and recommended the following at today's visit.   DISCUSSION: We discussed that approximately 5-10% of breast cancer is hereditary  Most cases of hereditary breast cancer are associated with BRCA1/BRCA2 genes, although there are other genes associated with hereditary breast cancer as well as genes associated with prostate, uterine, and other cancers .  We discussed that testing is beneficial for several reasons including  knowing about other cancer risks, identifying potential screening and risk-reduction options that may be appropriate, and to understand  if other family members could be at risk for cancer and allow them to undergo genetic testing.   We reviewed the characteristics, features and inheritance patterns of hereditary cancer syndromes. We also discussed genetic testing, including the appropriate family members to test, the process of testing, insurance coverage and turn-around-time for results. We discussed the implications of a negative, positive and/or variant of uncertain significant result. We recommended Ms. Diliberto pursue genetic testing for the [facility] Multi-Cancer Panel+RNA gene panel.   The Multi-Cancer Panel + RNA offered by [facility] includes sequencing and/or deletion duplication testing of the following 84 genes: AIP, ALK, APC, ATM, AXIN2,BAP1,  BARD1, BLM, BMPR1A, BRCA1, BRCA2, BRIP1, CASR, CDC73, CDH1, CDK4, CDKN1B, CDKN1C, CDKN2A (p14ARF), CDKN2A (p16INK4a), CEBPA, CHEK2, CTNNA1, DICER1, DIS3L2, EGFR (c.2369C>T, p.Thr790Met variant only), EPCAM (Deletion/duplication testing only), FH, FLCN, GATA2, GPC3, GREM1  (Promoter region deletion/duplication testing only), HOXB13 (c.251G>A, p.Gly84Glu), HRAS, KIT, MAX, MEN1, MET, MITF (c.952G>A, p.Glu318Lys variant only), MLH1, MSH2, MSH3, MSH6, MUTYH, NBN, NF1, NF2, NTHL1, PALB2, PDGFRA, PHOX2B, PMS2, POLD1, POLE, POT1, PRKAR1A, PTCH1, PTEN, RAD50, RAD51C, RAD51D, RB1, RECQL4, RET, RUNX1, SDHAF2, SDHA (sequence changes only), SDHB, SDHC, SDHD, SMAD4, SMARCA4, SMARCB1, SMARCE1, STK11, SUFU, TERC, TERT, TMEM127, TP53, TSC1, TSC2, VHL, WRN and WT1.  Based on Ms. Frankland's personal and family history of cancer, she meets medical criteria for genetic testing. Despite that she meets criteria, she may still have an out of pocket cost. We discussed that if her out of pocket cost for testing is over $100, the laboratory will call and confirm whether she wants to proceed with testing.  If the out of pocket cost of testing is less than $100 she will be billed by the genetic testing laboratory.   PLAN: After considering the risks, benefits, and limitations, Ms. Britain provided informed consent to pursue genetic testing and the blood sample was sent to [facility] for analysis of the Multi-Cancer Panel+RNA. Results should be available within approximately 2-3 weeks' time, at which point they will be disclosed by telephone to Ms. Delarosa, as will any additional recommendations warranted by these results. Ms. Nouri will receive a summary of her genetic counseling visit and a copy of her results once available. This information will also be available in Epic.   Ms. Barman questions were answered to her satisfaction today. Our contact information was provided should additional questions or concerns arise. Thank you for the referral and allowing [place] to share in the care of your patient.   Faith Rogue, MS, [facility] Genetic Counselor [email or web address] Phone: [phone]  The patient was seen for a total of 30 minutes in face-to-face genetic counseling.  Dr. Grayland Ormond  was available for discussion regarding this case.   _______________________________________________________________________ For Office Staff:  Number of people involved in session: 1 Was an Intern/ student involved with case: no

## 2021-04-04 ENCOUNTER — Ambulatory Visit (INDEPENDENT_AMBULATORY_CARE_PROVIDER_SITE_OTHER): Payer: No Typology Code available for payment source | Admitting: Surgical

## 2021-04-04 ENCOUNTER — Other Ambulatory Visit: Payer: Self-pay

## 2021-04-04 ENCOUNTER — Ambulatory Visit: Payer: No Typology Code available for payment source | Admitting: Surgical

## 2021-04-04 DIAGNOSIS — I10 Essential (primary) hypertension: Secondary | ICD-10-CM | POA: Diagnosis not present

## 2021-04-04 DIAGNOSIS — D4862 Neoplasm of uncertain behavior of left breast: Secondary | ICD-10-CM

## 2021-04-04 DIAGNOSIS — C50512 Malignant neoplasm of lower-outer quadrant of left female breast: Secondary | ICD-10-CM

## 2021-04-04 DIAGNOSIS — Z170 Estrogen receptor positive status [ER+]: Secondary | ICD-10-CM

## 2021-04-04 DIAGNOSIS — E782 Mixed hyperlipidemia: Secondary | ICD-10-CM | POA: Diagnosis not present

## 2021-04-04 DIAGNOSIS — R7302 Impaired glucose tolerance (oral): Secondary | ICD-10-CM | POA: Diagnosis not present

## 2021-04-04 DIAGNOSIS — C50112 Malignant neoplasm of central portion of left female breast: Secondary | ICD-10-CM | POA: Diagnosis not present

## 2021-04-04 NOTE — Progress Notes (Signed)
Patient is a 55 year old female here for follow-up on her left breast reconstruction.  She had a immediate reconstruction with placement of tissue expander on 02/10/2021 with Dr. Marla Roe after mastectomy by general surgery.  She is 7 weeks postop.  She is currently undergoing chemotherapy.  She previously reported that she will complete chemotherapy sometime around late July.  Patient reports she is overall doing well.  She tolerated her last fill fine.  Chaperone present on exam On exam left breast incision is intact and well-healed.  No erythema.  No subcutaneous fluid collections noted.  We placed injectable saline in the Expander using a sterile technique: Left: 50 cc for a total of 300/ 535 cc  Pictures were obtained of the patient and placed in the chart with the patient's or guardian's permission. We discussed silicone versus saline implant, patient is interested in silicone implants. F/u in 2 weeks for additional fill.

## 2021-04-08 ENCOUNTER — Encounter: Payer: Self-pay | Admitting: Internal Medicine

## 2021-04-08 ENCOUNTER — Other Ambulatory Visit: Payer: Self-pay

## 2021-04-08 ENCOUNTER — Inpatient Hospital Stay: Payer: No Typology Code available for payment source

## 2021-04-08 ENCOUNTER — Inpatient Hospital Stay (HOSPITAL_BASED_OUTPATIENT_CLINIC_OR_DEPARTMENT_OTHER): Payer: No Typology Code available for payment source | Admitting: Internal Medicine

## 2021-04-08 DIAGNOSIS — C50512 Malignant neoplasm of lower-outer quadrant of left female breast: Secondary | ICD-10-CM

## 2021-04-08 DIAGNOSIS — I10 Essential (primary) hypertension: Secondary | ICD-10-CM | POA: Diagnosis not present

## 2021-04-08 DIAGNOSIS — Z170 Estrogen receptor positive status [ER+]: Secondary | ICD-10-CM | POA: Diagnosis not present

## 2021-04-08 DIAGNOSIS — Z5112 Encounter for antineoplastic immunotherapy: Secondary | ICD-10-CM | POA: Diagnosis not present

## 2021-04-08 DIAGNOSIS — E782 Mixed hyperlipidemia: Secondary | ICD-10-CM | POA: Diagnosis not present

## 2021-04-08 DIAGNOSIS — Z5111 Encounter for antineoplastic chemotherapy: Secondary | ICD-10-CM

## 2021-04-08 LAB — COMPREHENSIVE METABOLIC PANEL
ALT: 30 U/L (ref 0–44)
AST: 18 U/L (ref 15–41)
Albumin: 4.2 g/dL (ref 3.5–5.0)
Alkaline Phosphatase: 74 U/L (ref 38–126)
Anion gap: 10 (ref 5–15)
BUN: 24 mg/dL — ABNORMAL HIGH (ref 6–20)
CO2: 23 mmol/L (ref 22–32)
Calcium: 9.3 mg/dL (ref 8.9–10.3)
Chloride: 105 mmol/L (ref 98–111)
Creatinine, Ser: 1.06 mg/dL — ABNORMAL HIGH (ref 0.44–1.00)
GFR, Estimated: >60 mL/min (ref >60)
Glucose, Bld: 102 mg/dL — ABNORMAL HIGH (ref 70–99)
Potassium: 4.4 mmol/L (ref 3.5–5.1)
Sodium: 138 mmol/L (ref 135–145)
Total Bilirubin: 0.8 mg/dL (ref 0.3–1.2)
Total Protein: 6.9 g/dL (ref 6.5–8.1)

## 2021-04-08 LAB — CBC WITH DIFFERENTIAL/PLATELET
Abs Immature Granulocytes: 0.32 10*3/uL — ABNORMAL HIGH (ref 0.00–0.07)
Basophils Absolute: 0 10*3/uL (ref 0.0–0.1)
Basophils Relative: 1 %
Eosinophils Absolute: 0.1 10*3/uL (ref 0.0–0.5)
Eosinophils Relative: 1 %
HCT: 36.8 % (ref 36.0–46.0)
Hemoglobin: 12.2 g/dL (ref 12.0–15.0)
Immature Granulocytes: 5 %
Lymphocytes Relative: 27 %
Lymphs Abs: 1.9 10*3/uL (ref 0.7–4.0)
MCH: 27.4 pg (ref 26.0–34.0)
MCHC: 33.2 g/dL (ref 30.0–36.0)
MCV: 82.5 fL (ref 80.0–100.0)
Monocytes Absolute: 0.7 10*3/uL (ref 0.1–1.0)
Monocytes Relative: 10 %
Neutro Abs: 4 10*3/uL (ref 1.7–7.7)
Neutrophils Relative %: 56 %
Platelets: 371 10*3/uL (ref 150–400)
RBC: 4.46 MIL/uL (ref 3.87–5.11)
RDW: 15.7 % — ABNORMAL HIGH (ref 11.5–15.5)
WBC: 7 10*3/uL (ref 4.0–10.5)
nRBC: 0 % (ref 0.0–0.2)

## 2021-04-08 MED ORDER — PACLITAXEL CHEMO INJECTION 300 MG/50ML
80.0000 mg/m2 | Freq: Once | INTRAVENOUS | Status: AC
  Administered 2021-04-08: 162 mg via INTRAVENOUS
  Filled 2021-04-08: qty 27

## 2021-04-08 MED ORDER — SODIUM CHLORIDE 0.9 % IV SOLN
Freq: Once | INTRAVENOUS | Status: AC
  Filled 2021-04-08: qty 250

## 2021-04-08 MED ORDER — TRASTUZUMAB-ANNS CHEMO 150 MG IV SOLR
2.0000 mg/kg | Freq: Once | INTRAVENOUS | Status: AC
  Administered 2021-04-08: 168 mg via INTRAVENOUS
  Filled 2021-04-08: qty 8

## 2021-04-08 MED ORDER — DIPHENHYDRAMINE HCL 50 MG/ML IJ SOLN
50.0000 mg | Freq: Once | INTRAMUSCULAR | Status: AC
  Administered 2021-04-08: 50 mg via INTRAVENOUS
  Filled 2021-04-08: qty 1

## 2021-04-08 MED ORDER — HEPARIN SOD (PORK) LOCK FLUSH 100 UNIT/ML IV SOLN
500.0000 [IU] | Freq: Once | INTRAVENOUS | Status: AC
  Administered 2021-04-08: 500 [IU] via INTRAVENOUS
  Filled 2021-04-08: qty 5

## 2021-04-08 MED ORDER — SODIUM CHLORIDE 0.9% FLUSH
10.0000 mL | Freq: Once | INTRAVENOUS | Status: AC
  Administered 2021-04-08: 10 mL via INTRAVENOUS
  Filled 2021-04-08: qty 10

## 2021-04-08 MED ORDER — ACETAMINOPHEN 325 MG PO TABS
650.0000 mg | ORAL_TABLET | Freq: Once | ORAL | Status: AC
  Administered 2021-04-08: 650 mg via ORAL
  Filled 2021-04-08: qty 2

## 2021-04-08 MED ORDER — SODIUM CHLORIDE 0.9 % IV SOLN
20.0000 mg | Freq: Once | INTRAVENOUS | Status: AC
  Administered 2021-04-08: 20 mg via INTRAVENOUS
  Filled 2021-04-08: qty 20

## 2021-04-08 MED ORDER — FAMOTIDINE 20 MG IN NS 100 ML IVPB
20.0000 mg | Freq: Once | INTRAVENOUS | Status: AC
  Administered 2021-04-08: 20 mg via INTRAVENOUS
  Filled 2021-04-08: qty 20

## 2021-04-08 NOTE — Progress Notes (Signed)
[facility] [place] NOTE  Patient Care Team: [name], MD as PCP - General (Internal Medicine) [name], RN as Registered Nurse  CHIEF COMPLAINTS/PURPOSE OF CONSULTATION: Breast cancer    Oncology History Overview Note  # April 2022-stage Ia mammary carcinoma ER/PR positive HER2 positive [TRIPLE positive]; negative margins s/p simple mastectomy with plan for immediate reconstruction [Dr.Rodenberg/[name]]; NO RT  # May 2nd, 2022- Taxol-Herceptin  # MUGA scan- 64% [03/06/2021]-   # LMP- mid 2020; Uterine ablation- [phone]; # HTN; Asthma- well controlled [on allergy shots];Marland Kitchen    Malignant neoplasm of lower-outer quadrant of left breast of female, estrogen receptor positive ([place])  12/25/2020 Initial Diagnosis   Malignant neoplasm of lower-outer quadrant of left breast of female, estrogen receptor positive ([place])   02/26/2021 Cancer Staging   Staging form: Breast, AJCC 8th Edition - Pathologic: Stage IA (pT1c, pN0, cM0, G3, ER+, PR+, HER2+) - Signed by Cammie Sickle, MD on 02/26/2021 Mitotic count score: Score 3 Histologic grading system: 3 grade system   03/10/2021 -  Chemotherapy    Patient is on Treatment Plan: BREAST PACLITAXEL + TRASTUZUMAB Q7D / TRASTUZUMAB Q21D         HISTORY OF PRESENTING ILLNESS:  Judy Padilla 55 y.o.  female newly diagnosed breast cancer stage I ER/PR positive HER2/neu positive on Taxol Herceptin adjuvant chemotherapy is here for follow-up..  Patient admits to hair loss.  Otherwise denies any shortness of breath or cough.  Denies any fevers or chills.  Review of Systems  Constitutional: Negative for chills, diaphoresis, fever, malaise/fatigue and weight loss.  HENT: Negative for nosebleeds and sore throat.   Eyes: Negative for double vision.  Respiratory: Negative for cough, hemoptysis, sputum production, shortness of breath and wheezing.   Cardiovascular: Negative for chest pain, palpitations, orthopnea and  leg swelling.  Gastrointestinal: Negative for abdominal pain, blood in stool, constipation, diarrhea, heartburn, melena, nausea and vomiting.  Genitourinary: Negative for dysuria, frequency and urgency.  Musculoskeletal: Negative for back pain and joint pain.  Skin: Negative.  Negative for itching and rash.  Neurological: Negative for dizziness, tingling, focal weakness, weakness and headaches.  Endo/Heme/Allergies: Does not bruise/bleed easily.  Psychiatric/Behavioral: Negative for depression. The patient is not nervous/anxious and does not have insomnia.      MEDICAL HISTORY:  Past Medical History:  Diagnosis Date  . Asthma    well controlled  . Breast cancer ([place])   . Family history of adverse reaction to anesthesia    sister-hard time waking up  . Family history of breast cancer   . Family history of prostate cancer   . Family history of uterine cancer   . GERD (gastroesophageal reflux disease)   . Hypertension     SURGICAL HISTORY: Past Surgical History:  Procedure Laterality Date  . ABLATION    . BREAST BIOPSY Left 2011   Benign per pt  . BREAST BIOPSY Left 12/12/2020   3:30 3 cmfn, Q marker, path pending  . BREAST BIOPSY Left 12/12/2020   3:30 1 cmfn, Vision marker, path pending  . BREAST RECONSTRUCTION WITH PLACEMENT OF TISSUE EXPANDER AND FLEX HD (ACELLULAR HYDRATED DERMIS) Left 02/10/2021   Procedure: IMMEDIATE LEFT BREAST RECONSTRUCTION WITH PLACEMENT OF TISSUE EXPANDER AND FLEX HD (ACELLULAR HYDRATED DERMIS);  Surgeon: Wallace Going, DO;  Location: [facility];  Service: Plastics;  Laterality: Left;  . COLONOSCOPY  01/15/2020  . PORTACATH PLACEMENT Right 02/10/2021   Procedure: INSERTION PORT-A-CATH;  Surgeon: Ronny Bacon, MD;  Location:  [facility] OR[facility];  Service: General;  Laterality: Right;  . SIMPLE MASTECTOMY WITH AXILLARY SENTINEL NODE BIOPSY Left 02/10/2021   Procedure: SIMPLE MASTECTOMY WITH AXILLARY SENTINEL NODE BIOPSY;  Surgeon: Ronny Bacon, MD;   Location: [facility] OR[facility];  Service: General;  Laterality: Left;    SOCIAL HISTORY: Social History   Socioeconomic History  . Marital status: Married    Spouse name: Not on file  . Number of children: Not on file  . Years of education: Not on file  . Highest education level: Not on file  Occupational History  . Not on file  Tobacco Use  . Smoking status: Never Smoker  . Smokeless tobacco: Never Used  Vaping Use  . Vaping Use: Never used  Substance and Sexual Activity  . Alcohol use: Not Currently  . Drug use: Never  . Sexual activity: Not on file  Other Topics Concern  . Not on file  Social History Narrative   Lives in [place] with husband; kids- college. Works for [employer]- working from home. No smoking or alcohol.    Social Determinants of Health   Financial Resource Strain: Not on file  Food Insecurity: Not on file  Transportation Needs: Not on file  Physical Activity: Not on file  Stress: Not on file  Social Connections: Not on file  Intimate Partner Violence: Not on file    FAMILY HISTORY: Family History  Problem Relation Age of Onset  . Hypertension Mother   . Diabetes Mother   . Hypertension Father   . Diabetes Father   . Cancer Father        prostate cancer-70s  . Cancer Maternal Grandmother        uterine cancer  . Breast cancer Sister        in in 10s.     ALLERGIES:  is allergic to shrimp extract allergy skin test and other.  MEDICATIONS:  Current Outpatient Medications  Medication Sig Dispense Refill  . albuterol (VENTOLIN HFA) 108 (90 Base) MCG/ACT inhaler Inhale 1 puff into the lungs every 6 (six) hours as needed for shortness of breath.    Marland Kitchen amLODipine (NORVASC) 10 MG tablet Take 10 mg by mouth every morning.    Marland Kitchen atorvastatin (LIPITOR) 80 MG tablet Take 80 mg by mouth every morning.    . budesonide (PULMICORT) 0.5 MG/2ML nebulizer solution Take 2 mLs by nebulization daily.    . cetirizine (ZYRTEC) 10 MG tablet Take 10 mg by  mouth daily as needed for allergies.    . Cholecalciferol 1.25 MG (50000 UT) capsule Take 50,000 Units by mouth once a week.    . diazepam (VALIUM) 2 MG tablet Take 1 tablet (2 mg total) by mouth every 12 (twelve) hours as needed for muscle spasms. 20 tablet 0  . EPINEPHrine 0.3 mg/0.3 mL IJ SOAJ injection Inject 0.3 mg into the muscle as needed for anaphylaxis.    . fluticasone (FLONASE) 50 MCG/ACT nasal spray Place 1 spray into both nostrils daily as needed for allergies.    . fluticasone furoate-vilanterol (BREO ELLIPTA) 100-25 MCG/INH AEPB Inhale 1 puff into the lungs as needed for shortness of breath.    . GNP BUDESONIDE NASAL SPRAY NA Place 1 spray into the nose daily.    Marland Kitchen lidocaine-prilocaine (EMLA) cream Apply 30 -45 mins prior to port access. 30 g 0  . Multiple Vitamins-Minerals (CENTRUM ADULTS) TABS Take 1 tablet by mouth daily.    . naproxen sodium (ALEVE) 220 MG tablet Take 220 mg by mouth  daily as needed (knee pain).    . NON FORMULARY Takes weekly allergy shots    . ondansetron (ZOFRAN) 4 MG tablet Take 1 tablet (4 mg total) by mouth every 8 (eight) hours as needed for nausea or vomiting. 20 tablet 0  . ondansetron (ZOFRAN) 8 MG tablet One pill every 8 hours as needed for nausea/vomitting. 40 tablet 1  . pantoprazole (PROTONIX) 40 MG tablet Take 40 mg by mouth every morning.    . prochlorperazine (COMPAZINE) 10 MG tablet Take 1 tablet (10 mg total) by mouth every 6 (six) hours as needed for nausea or vomiting. 40 tablet 1   No current facility-administered medications for this visit.   Facility-Administered Medications Ordered in Other Visits  Medication Dose Route Frequency Provider Last Rate Last Admin  . heparin lock flush 100 unit/mL  500 Units Intravenous Once Charlaine Dalton R, MD      . PACLitaxel (TAXOL) 162 mg in sodium chloride 0.9 % 250 mL chemo infusion (</= 43m/m2)  80 mg/m2 (Treatment Plan Recorded) Intravenous Once BCammie Sickle MD 277 mL/hr at  04/08/21 1217 162 mg at 04/08/21 1217      .  PHYSICAL EXAMINATION: ECOG PERFORMANCE STATUS: 0 - Asymptomatic  Vitals:   04/08/21 0845  BP: 128/83  Pulse: 89  Resp: 20  Temp: 98.2 F (36.8 C)   Filed Weights   04/08/21 0845  Weight: 85.9 kg    Physical Exam Constitutional:      Comments: Accompanied by husband.  Ambulating independently.  HENT:     Head: Normocephalic and atraumatic.     Mouth/Throat:     Pharynx: No oropharyngeal exudate.  Eyes:     Pupils: Pupils are equal, round, and reactive to light.  Cardiovascular:     Rate and Rhythm: Normal rate and regular rhythm.  Pulmonary:     Effort: Pulmonary effort is normal. No respiratory distress.     Breath sounds: Normal breath sounds. No wheezing.  Abdominal:     General: Bowel sounds are normal. There is no distension.     Palpations: Abdomen is soft. There is no mass.     Tenderness: There is no abdominal tenderness. There is no guarding or rebound.  Musculoskeletal:        General: No tenderness. Normal range of motion.     Cervical back: Normal range of motion and neck supple.  Skin:    General: Skin is warm.  Neurological:     Mental Status: She is alert and oriented to person, place, and time.  Psychiatric:        Mood and Affect: Affect normal.      LABORATORY DATA:  I have reviewed the data as listed Lab Results  Component Value Date   WBC 7.0 04/08/2021   HGB 12.2 04/08/2021   HCT 36.8 04/08/2021   MCV 82.5 04/08/2021   PLT 371 04/08/2021   Recent Labs    03/24/21 0801 03/31/21 0803 04/08/21 0826  NA 136 138 138  K 3.9 3.8 4.4  CL 105 107 105  CO2 _0 GLUCOSE 105* 103* 102*  BUN 18 20 24*  CREATININE 0.87 0.95 1.06*  CALCIUM 8.7* 8.8* 9.3  GFRNONAA >60 >60 >60  PROT 6.6 6.7 6.9  ALBUMIN 3.9 4.0 4.2  AST _1 ALT 64* 47* 30  ALKPHOS 77 78 74  BILITOT 0.7 0.6 0.8    RADIOGRAPHIC STUDIES: I have personally reviewed the radiological images as listed and  agreed  with the findings in the report. No results found.  ASSESSMENT & PLAN:   Malignant neoplasm of lower-outer quadrant of left breast of female, estrogen receptor positive ([place]) # Left breast- CA- s/p mastectomy-  pT1c pN0 [Stage IA]Grade 3.  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  On adjuvant Taxol Herceptin.  #Proceed with cycle #2 Taxol-Herceprin Labs today reviewed;  acceptable for treatment today.   # Genetics: Patient meets the criteria for genetics; sister diagnosed with breast cancer.  S/p genetics; awaiting results. .  # DISPOSITION: # Proceed with Taxol-herceptin  # 1 week- labs- cbc/bmp; taxol-herceptin # # follow up in 2 weeks- MD labs- cbc/cmp;Taxol-Herceptin-weekly- Dr.B   All questions were answered. The patient/family knows to call the clinic with any problems, questions or concerns.    Cammie Sickle, MD 04/08/2021 1:06 PM

## 2021-04-08 NOTE — Progress Notes (Signed)
Patient here for pre treatment check. No concerns today. 

## 2021-04-08 NOTE — Progress Notes (Signed)
Patient tolerated PACLitaxel / trastuzumab-anns  infusion well  today, no concerns voiced. Patient discharged. Stable.

## 2021-04-08 NOTE — Patient Instructions (Signed)
[facility]  Discharge Instructions: Thank you for choosing [facility] to provide your oncology and hematology care.  If you have a lab appointment with the [facility], please go directly to the [facility] and check in at the registration area.  Wear comfortable clothing and clothing appropriate for easy access to any Portacath or PICC line.   We strive to give you quality time with your provider. You may need to reschedule your appointment if you arrive late (15 or more minutes).  Arriving late affects you and other patients whose appointments are after yours.  Also, if you miss three or more appointments without notifying the office, you may be dismissed from the clinic at the provider's discretion.      For prescription refill requests, have your pharmacy contact our office and allow 72 hours for refills to be completed.    Today you received the following chemotherapy and/or immunotherapy agents: taxol-Kanjinti   To help prevent nausea and vomiting after your treatment, we encourage you to take your nausea medication as directed.  BELOW ARE SYMPTOMS THAT SHOULD BE REPORTED IMMEDIATELY: . *FEVER GREATER THAN 100.4 F (38 C) OR HIGHER . *CHILLS OR SWEATING . *NAUSEA AND VOMITING THAT IS NOT CONTROLLED WITH YOUR NAUSEA MEDICATION . *UNUSUAL SHORTNESS OF BREATH . *UNUSUAL BRUISING OR BLEEDING . *URINARY PROBLEMS (pain or burning when urinating, or frequent urination) . *BOWEL PROBLEMS (unusual diarrhea, constipation, pain near the anus) . TENDERNESS IN MOUTH AND THROAT WITH OR WITHOUT PRESENCE OF ULCERS (sore throat, sores in mouth, or a toothache) . UNUSUAL RASH, SWELLING OR PAIN  . UNUSUAL VAGINAL DISCHARGE OR ITCHING   Items with * indicate a potential emergency and should be followed up as soon as possible or go to the Emergency Department if any problems should occur.  Please show the CHEMOTHERAPY ALERT CARD or IMMUNOTHERAPY  ALERT CARD at check-in to the Emergency Department and triage nurse.  Should you have questions after your visit or need to cancel or reschedule your appointment, please contact [facility]  [phone] and follow the prompts.  Office hours are 8:00 a.m. to 4:30 p.m. Monday - Friday. Please note that voicemails left after 4:00 p.m. may not be returned until the following business day.  We are closed weekends and major holidays. You have access to a nurse at all times for urgent questions. Please call the main number to the clinic [phone] and follow the prompts.  For any non-urgent questions, you may also contact your provider using MyChart. We now offer e-Visits for anyone 80 and older to request care online for non-urgent symptoms. For details visit [email or web address].   Also download the MyChart app! Go to the app store, search "MyChart", open the app, select [facility], and log in with your MyChart username and password.  Due to Covid, a mask is required upon entering the hospital/clinic. If you do not have a mask, one will be given to you upon arrival. For doctor visits, patients may have 1 support person aged 2 or older with them. For treatment visits, patients cannot have anyone with them due to current Covid guidelines and our immunocompromised population.

## 2021-04-08 NOTE — Assessment & Plan Note (Addendum)
#  Left breast- CA- s/p mastectomy-  pT1c pN0 [Stage IA]Grade 3.  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  On adjuvant Taxol Herceptin.  #Proceed with cycle #2 Taxol-Herceprin Labs today reviewed;  acceptable for treatment today.   # Genetics: Patient meets the criteria for genetics; sister diagnosed with breast cancer.  S/p genetics; awaiting results. .  # DISPOSITION: # Proceed with Taxol-herceptin  # 1 week- labs- cbc/bmp; taxol-herceptin # # follow up in 2 weeks- MD labs- cbc/cmp;Taxol-Herceptin-weekly- Dr.B

## 2021-04-09 ENCOUNTER — Inpatient Hospital Stay: Payer: No Typology Code available for payment source

## 2021-04-09 ENCOUNTER — Inpatient Hospital Stay: Payer: No Typology Code available for payment source | Admitting: Licensed Clinical Social Worker

## 2021-04-10 DIAGNOSIS — J301 Allergic rhinitis due to pollen: Secondary | ICD-10-CM | POA: Diagnosis not present

## 2021-04-15 ENCOUNTER — Inpatient Hospital Stay: Payer: No Typology Code available for payment source | Attending: Internal Medicine

## 2021-04-15 ENCOUNTER — Inpatient Hospital Stay: Payer: No Typology Code available for payment source

## 2021-04-15 VITALS — BP 122/82 | HR 76 | Temp 96.9°F | Resp 16 | Wt 191.2 lb

## 2021-04-15 DIAGNOSIS — Z5111 Encounter for antineoplastic chemotherapy: Secondary | ICD-10-CM | POA: Diagnosis present

## 2021-04-15 DIAGNOSIS — Z5112 Encounter for antineoplastic immunotherapy: Secondary | ICD-10-CM | POA: Diagnosis not present

## 2021-04-15 DIAGNOSIS — Z170 Estrogen receptor positive status [ER+]: Secondary | ICD-10-CM | POA: Insufficient documentation

## 2021-04-15 DIAGNOSIS — Z95828 Presence of other vascular implants and grafts: Secondary | ICD-10-CM

## 2021-04-15 DIAGNOSIS — C50512 Malignant neoplasm of lower-outer quadrant of left female breast: Secondary | ICD-10-CM | POA: Diagnosis present

## 2021-04-15 LAB — CBC WITH DIFFERENTIAL/PLATELET
Abs Immature Granulocytes: 0.08 10*3/uL — ABNORMAL HIGH (ref 0.00–0.07)
Basophils Absolute: 0 10*3/uL (ref 0.0–0.1)
Basophils Relative: 1 %
Eosinophils Absolute: 0.1 10*3/uL (ref 0.0–0.5)
Eosinophils Relative: 2 %
HCT: 34.3 % — ABNORMAL LOW (ref 36.0–46.0)
Hemoglobin: 11.4 g/dL — ABNORMAL LOW (ref 12.0–15.0)
Immature Granulocytes: 1 %
Lymphocytes Relative: 27 %
Lymphs Abs: 1.6 10*3/uL (ref 0.7–4.0)
MCH: 27.5 pg (ref 26.0–34.0)
MCHC: 33.2 g/dL (ref 30.0–36.0)
MCV: 82.7 fL (ref 80.0–100.0)
Monocytes Absolute: 0.4 10*3/uL (ref 0.1–1.0)
Monocytes Relative: 8 %
Neutro Abs: 3.6 10*3/uL (ref 1.7–7.7)
Neutrophils Relative %: 61 %
Platelets: 283 10*3/uL (ref 150–400)
RBC: 4.15 MIL/uL (ref 3.87–5.11)
RDW: 15.9 % — ABNORMAL HIGH (ref 11.5–15.5)
WBC: 5.9 10*3/uL (ref 4.0–10.5)
nRBC: 0 % (ref 0.0–0.2)

## 2021-04-15 LAB — COMPREHENSIVE METABOLIC PANEL WITH GFR
ALT: 39 U/L (ref 0–44)
AST: 18 U/L (ref 15–41)
Albumin: 4 g/dL (ref 3.5–5.0)
Alkaline Phosphatase: 71 U/L (ref 38–126)
Anion gap: 10 (ref 5–15)
BUN: 18 mg/dL (ref 6–20)
CO2: 22 mmol/L (ref 22–32)
Calcium: 9.1 mg/dL (ref 8.9–10.3)
Chloride: 104 mmol/L (ref 98–111)
Creatinine, Ser: 0.94 mg/dL (ref 0.44–1.00)
GFR, Estimated: >60 mL/min (ref >60)
Glucose, Bld: 112 mg/dL — ABNORMAL HIGH (ref 70–99)
Potassium: 4.1 mmol/L (ref 3.5–5.1)
Sodium: 136 mmol/L (ref 135–145)
Total Bilirubin: 0.2 mg/dL — ABNORMAL LOW (ref 0.3–1.2)
Total Protein: 6.8 g/dL (ref 6.5–8.1)

## 2021-04-15 MED ORDER — DIPHENHYDRAMINE HCL 50 MG/ML IJ SOLN
50.0000 mg | Freq: Once | INTRAMUSCULAR | Status: AC
  Administered 2021-04-15: 50 mg via INTRAVENOUS
  Filled 2021-04-15: qty 1

## 2021-04-15 MED ORDER — SODIUM CHLORIDE 0.9 % IV SOLN
Freq: Once | INTRAVENOUS | Status: AC
Start: 2021-04-15 — End: 2021-04-15
  Filled 2021-04-15: qty 250

## 2021-04-15 MED ORDER — HEPARIN SOD (PORK) LOCK FLUSH 100 UNIT/ML IV SOLN
INTRAVENOUS | Status: AC
  Filled 2021-04-15: qty 5

## 2021-04-15 MED ORDER — HEPARIN SOD (PORK) LOCK FLUSH 100 UNIT/ML IV SOLN
500.0000 [IU] | Freq: Once | INTRAVENOUS | Status: AC | PRN
  Administered 2021-04-15: 500 [IU]
  Filled 2021-04-15: qty 5

## 2021-04-15 MED ORDER — SODIUM CHLORIDE 0.9% FLUSH
10.0000 mL | INTRAVENOUS | Status: DC | PRN
  Filled 2021-04-15: qty 10

## 2021-04-15 MED ORDER — ACETAMINOPHEN 325 MG PO TABS
650.0000 mg | ORAL_TABLET | Freq: Once | ORAL | Status: AC
  Administered 2021-04-15: 650 mg via ORAL
  Filled 2021-04-15: qty 2

## 2021-04-15 MED ORDER — FAMOTIDINE 20 MG IN NS 100 ML IVPB
20.0000 mg | Freq: Once | INTRAVENOUS | Status: AC
  Administered 2021-04-15: 20 mg via INTRAVENOUS
  Filled 2021-04-15: qty 20

## 2021-04-15 MED ORDER — SODIUM CHLORIDE 0.9% FLUSH
10.0000 mL | Freq: Once | INTRAVENOUS | Status: AC
  Administered 2021-04-15: 10 mL via INTRAVENOUS
  Filled 2021-04-15: qty 10

## 2021-04-15 MED ORDER — SODIUM CHLORIDE 0.9 % IV SOLN
80.0000 mg/m2 | Freq: Once | INTRAVENOUS | Status: AC
  Administered 2021-04-15: 162 mg via INTRAVENOUS
  Filled 2021-04-15: qty 27

## 2021-04-15 MED ORDER — HEPARIN SOD (PORK) LOCK FLUSH 100 UNIT/ML IV SOLN
500.0000 [IU] | Freq: Once | INTRAVENOUS | Status: DC
  Filled 2021-04-15: qty 5

## 2021-04-15 MED ORDER — SODIUM CHLORIDE 0.9 % IV SOLN
20.0000 mg | Freq: Once | INTRAVENOUS | Status: AC
  Administered 2021-04-15: 20 mg via INTRAVENOUS
  Filled 2021-04-15: qty 20

## 2021-04-15 MED ORDER — TRASTUZUMAB-ANNS CHEMO 150 MG IV SOLR
2.0000 mg/kg | Freq: Once | INTRAVENOUS | Status: AC
  Administered 2021-04-15: 168 mg via INTRAVENOUS
  Filled 2021-04-15: qty 8

## 2021-04-15 NOTE — Patient Instructions (Signed)
[facility]  Discharge Instructions: Thank you for choosing [facility] to provide your oncology and hematology care.  If you have a lab appointment with the [facility], please go directly to the [facility] and check in at the registration area.  Wear comfortable clothing and clothing appropriate for easy access to any Portacath or PICC line.   We strive to give you quality time with your provider. You may need to reschedule your appointment if you arrive late (15 or more minutes).  Arriving late affects you and other patients whose appointments are after yours.  Also, if you miss three or more appointments without notifying the office, you may be dismissed from the clinic at the provider's discretion.      For prescription refill requests, have your pharmacy contact our office and allow 72 hours for refills to be completed.    Today you received the following chemotherapy and/or immunotherapy agents - paclitaxel, trastuzumab   To help prevent nausea and vomiting after your treatment, we encourage you to take your nausea medication as directed.  BELOW ARE SYMPTOMS THAT SHOULD BE REPORTED IMMEDIATELY: . *FEVER GREATER THAN 100.4 F (38 C) OR HIGHER . *CHILLS OR SWEATING . *NAUSEA AND VOMITING THAT IS NOT CONTROLLED WITH YOUR NAUSEA MEDICATION . *UNUSUAL SHORTNESS OF BREATH . *UNUSUAL BRUISING OR BLEEDING . *URINARY PROBLEMS (pain or burning when urinating, or frequent urination) . *BOWEL PROBLEMS (unusual diarrhea, constipation, pain near the anus) . TENDERNESS IN MOUTH AND THROAT WITH OR WITHOUT PRESENCE OF ULCERS (sore throat, sores in mouth, or a toothache) . UNUSUAL RASH, SWELLING OR PAIN  . UNUSUAL VAGINAL DISCHARGE OR ITCHING   Items with * indicate a potential emergency and should be followed up as soon as possible or go to the Emergency Department if any problems should occur.  Please show the CHEMOTHERAPY ALERT CARD or  IMMUNOTHERAPY ALERT CARD at check-in to the Emergency Department and triage nurse.  Should you have questions after your visit or need to cancel or reschedule your appointment, please contact [facility]  [phone] and follow the prompts.  Office hours are 8:00 a.m. to 4:30 p.m. Monday - Friday. Please note that voicemails left after 4:00 p.m. may not be returned until the following business day.  We are closed weekends and major holidays. You have access to a nurse at all times for urgent questions. Please call the main number to the clinic [phone] and follow the prompts.  For any non-urgent questions, you may also contact your provider using MyChart. We now offer e-Visits for anyone 22 and older to request care online for non-urgent symptoms. For details visit [email or web address].   Also download the MyChart app! Go to the app store, search "MyChart", open the app, select [facility], and log in with your MyChart username and password.  Due to Covid, a mask is required upon entering the hospital/clinic. If you do not have a mask, one will be given to you upon arrival. For doctor visits, patients may have 1 support person aged 40 or older with them. For treatment visits, patients cannot have anyone with them due to current Covid guidelines and our immunocompromised population.   Paclitaxel injection What is this medicine? PACLITAXEL (PAK li TAX el) is a chemotherapy drug. It targets fast dividing cells, like cancer cells, and causes these cells to die. This medicine is used to treat ovarian cancer, breast cancer, lung cancer, Kaposi's sarcoma, and other cancers.  This medicine may be used for other purposes; ask your health care provider or pharmacist if you have questions. COMMON BRAND NAME(S): Onxol, Taxol What should I tell my health care provider before I take this medicine? They need to know if you have any of these conditions:  history of  irregular heartbeat  liver disease  low blood counts, like low white cell, platelet, or red cell counts  lung or breathing disease, like asthma  tingling of the fingers or toes, or other nerve disorder  an unusual or allergic reaction to paclitaxel, alcohol, polyoxyethylated castor oil, other chemotherapy, other medicines, foods, dyes, or preservatives  pregnant or trying to get pregnant  breast-feeding How should I use this medicine? This drug is given as an infusion into a vein. It is administered in a hospital or clinic by a specially trained health care professional. Talk to your pediatrician regarding the use of this medicine in children. Special care may be needed. Overdosage: If you think you have taken too much of this medicine contact a poison control center or emergency room at once. NOTE: This medicine is only for you. Do not share this medicine with others. What if I miss a dose? It is important not to miss your dose. Call your doctor or health care professional if you are unable to keep an appointment. What may interact with this medicine? Do not take this medicine with any of the following medications:  live virus vaccines This medicine may also interact with the following medications:  antiviral medicines for hepatitis, HIV or AIDS  certain antibiotics like erythromycin and clarithromycin  certain medicines for fungal infections like ketoconazole and itraconazole  certain medicines for seizures like carbamazepine, phenobarbital, phenytoin  gemfibrozil  nefazodone  rifampin  St. John's wort This list may not describe all possible interactions. Give your health care provider a list of all the medicines, herbs, non-prescription drugs, or dietary supplements you use. Also tell them if you smoke, drink alcohol, or use illegal drugs. Some items may interact with your medicine. What should I watch for while using this medicine? Your condition will be monitored  carefully while you are receiving this medicine. You will need important blood work done while you are taking this medicine. This medicine can cause serious allergic reactions. To reduce your risk you will need to take other medicine(s) before treatment with this medicine. If you experience allergic reactions like skin rash, itching or hives, swelling of the face, lips, or tongue, tell your doctor or health care professional right away. In some cases, you may be given additional medicines to help with side effects. Follow all directions for their use. This drug may make you feel generally unwell. This is not uncommon, as chemotherapy can affect healthy cells as well as cancer cells. Report any side effects. Continue your course of treatment even though you feel ill unless your doctor tells you to stop. Call your doctor or health care professional for advice if you get a fever, chills or sore throat, or other symptoms of a cold or flu. Do not treat yourself. This drug decreases your body's ability to fight infections. Try to avoid being around people who are sick. This medicine may increase your risk to bruise or bleed. Call your doctor or health care professional if you notice any unusual bleeding. Be careful brushing and flossing your teeth or using a toothpick because you may get an infection or bleed more easily. If you have any dental work done, tell your  dentist you are receiving this medicine. Avoid taking products that contain aspirin, acetaminophen, ibuprofen, naproxen, or ketoprofen unless instructed by your doctor. These medicines may hide a fever. Do not become pregnant while taking this medicine. Women should inform their doctor if they wish to become pregnant or think they might be pregnant. There is a potential for serious side effects to an unborn child. Talk to your health care professional or pharmacist for more information. Do not breast-feed an infant while taking this medicine. Men are  advised not to father a child while receiving this medicine. This product may contain alcohol. Ask your pharmacist or healthcare provider if this medicine contains alcohol. Be sure to tell all healthcare providers you are taking this medicine. Certain medicines, like metronidazole and disulfiram, can cause an unpleasant reaction when taken with alcohol. The reaction includes flushing, headache, nausea, vomiting, sweating, and increased thirst. The reaction can last from 30 minutes to several hours. What side effects may I notice from receiving this medicine? Side effects that you should report to your doctor or health care professional as soon as possible:  allergic reactions like skin rash, itching or hives, swelling of the face, lips, or tongue  breathing problems  changes in vision  fast, irregular heartbeat  high or low blood pressure  mouth sores  pain, tingling, numbness in the hands or feet  signs of decreased platelets or bleeding - bruising, pinpoint red spots on the skin, black, tarry stools, blood in the urine  signs of decreased red blood cells - unusually weak or tired, feeling faint or lightheaded, falls  signs of infection - fever or chills, cough, sore throat, pain or difficulty passing urine  signs and symptoms of liver injury like dark yellow or brown urine; general ill feeling or flu-like symptoms; light-colored stools; loss of appetite; nausea; right upper belly pain; unusually weak or tired; yellowing of the eyes or skin  swelling of the ankles, feet, hands  unusually slow heartbeat Side effects that usually do not require medical attention (report to your doctor or health care professional if they continue or are bothersome):  diarrhea  hair loss  loss of appetite  muscle or joint pain  nausea, vomiting  pain, redness, or irritation at site where injected  tiredness This list may not describe all possible side effects. Call your doctor for medical  advice about side effects. You may report side effects to FDA at 1-800-FDA-1088. Where should I keep my medicine? This drug is given in a hospital or clinic and will not be stored at home. NOTE: This sheet is a summary. It may not cover all possible information. If you have questions about this medicine, talk to your doctor, pharmacist, or health care provider.  2021 Elsevier/Gold Standard (2019-09-27 13:37:23)  Trastuzumab injection for infusion What is this medicine? TRASTUZUMAB (tras TOO zoo mab) is a monoclonal antibody. It is used to treat breast cancer and stomach cancer. This medicine may be used for other purposes; ask your health care provider or pharmacist if you have questions. COMMON BRAND NAME(S): Herceptin, Galvin Proffer, Trazimera What should I tell my health care provider before I take this medicine? They need to know if you have any of these conditions:  heart disease  heart failure  lung or breathing disease, like asthma  an unusual or allergic reaction to trastuzumab, benzyl alcohol, or other medications, foods, dyes, or preservatives  pregnant or trying to get pregnant  breast-feeding How should I use this medicine?  This drug is given as an infusion into a vein. It is administered in a hospital or clinic by a specially trained health care professional. Talk to your pediatrician regarding the use of this medicine in children. This medicine is not approved for use in children. Overdosage: If you think you have taken too much of this medicine contact a poison control center or emergency room at once. NOTE: This medicine is only for you. Do not share this medicine with others. What if I miss a dose? It is important not to miss a dose. Call your doctor or health care professional if you are unable to keep an appointment. What may interact with this medicine? This medicine may interact with the following medications:  certain types of  chemotherapy, such as daunorubicin, doxorubicin, epirubicin, and idarubicin This list may not describe all possible interactions. Give your health care provider a list of all the medicines, herbs, non-prescription drugs, or dietary supplements you use. Also tell them if you smoke, drink alcohol, or use illegal drugs. Some items may interact with your medicine. What should I watch for while using this medicine? Visit your doctor for checks on your progress. Report any side effects. Continue your course of treatment even though you feel ill unless your doctor tells you to stop. Call your doctor or health care professional for advice if you get a fever, chills or sore throat, or other symptoms of a cold or flu. Do not treat yourself. Try to avoid being around people who are sick. You may experience fever, chills and shaking during your first infusion. These effects are usually mild and can be treated with other medicines. Report any side effects during the infusion to your health care professional. Fever and chills usually do not happen with later infusions. Do not become pregnant while taking this medicine or for 7 months after stopping it. Women should inform their doctor if they wish to become pregnant or think they might be pregnant. Women of child-bearing potential will need to have a negative pregnancy test before starting this medicine. There is a potential for serious side effects to an unborn child. Talk to your health care professional or pharmacist for more information. Do not breast-feed an infant while taking this medicine or for 7 months after stopping it. Women must use effective birth control with this medicine. What side effects may I notice from receiving this medicine? Side effects that you should report to your doctor or health care professional as soon as possible:  allergic reactions like skin rash, itching or hives, swelling of the face, lips, or tongue  chest pain or  palpitations  cough  dizziness  feeling faint or lightheaded, falls  fever  general ill feeling or flu-like symptoms  signs of worsening heart failure like breathing problems; swelling in your legs and feet  unusually weak or tired Side effects that usually do not require medical attention (report to your doctor or health care professional if they continue or are bothersome):  bone pain  changes in taste  diarrhea  joint pain  nausea/vomiting  weight loss This list may not describe all possible side effects. Call your doctor for medical advice about side effects. You may report side effects to FDA at 1-800-FDA-1088. Where should I keep my medicine? This drug is given in a hospital or clinic and will not be stored at home. NOTE: This sheet is a summary. It may not cover all possible information. If you have questions about this medicine, talk  to your doctor, pharmacist, or health care provider.  2021 Elsevier/Gold Standard (2016-10-20 14:37:52)

## 2021-04-18 ENCOUNTER — Ambulatory Visit: Payer: No Typology Code available for payment source | Admitting: Surgical

## 2021-04-22 ENCOUNTER — Inpatient Hospital Stay: Payer: No Typology Code available for payment source

## 2021-04-22 ENCOUNTER — Encounter: Payer: Self-pay | Admitting: Internal Medicine

## 2021-04-22 ENCOUNTER — Inpatient Hospital Stay (HOSPITAL_BASED_OUTPATIENT_CLINIC_OR_DEPARTMENT_OTHER): Payer: No Typology Code available for payment source | Admitting: Internal Medicine

## 2021-04-22 DIAGNOSIS — C50512 Malignant neoplasm of lower-outer quadrant of left female breast: Secondary | ICD-10-CM

## 2021-04-22 DIAGNOSIS — Z5112 Encounter for antineoplastic immunotherapy: Secondary | ICD-10-CM | POA: Diagnosis not present

## 2021-04-22 DIAGNOSIS — Z170 Estrogen receptor positive status [ER+]: Secondary | ICD-10-CM

## 2021-04-22 LAB — COMPREHENSIVE METABOLIC PANEL
ALT: 36 U/L (ref 0–44)
AST: 18 U/L (ref 15–41)
Albumin: 3.8 g/dL (ref 3.5–5.0)
Alkaline Phosphatase: 61 U/L (ref 38–126)
Anion gap: 7 (ref 5–15)
BUN: 21 mg/dL — ABNORMAL HIGH (ref 6–20)
CO2: 25 mmol/L (ref 22–32)
Calcium: 9 mg/dL (ref 8.9–10.3)
Chloride: 106 mmol/L (ref 98–111)
Creatinine, Ser: 1.02 mg/dL — ABNORMAL HIGH (ref 0.44–1.00)
GFR, Estimated: >60 mL/min (ref >60)
Glucose, Bld: 101 mg/dL — ABNORMAL HIGH (ref 70–99)
Potassium: 4.1 mmol/L (ref 3.5–5.1)
Sodium: 138 mmol/L (ref 135–145)
Total Bilirubin: 0.5 mg/dL (ref 0.3–1.2)
Total Protein: 6.4 g/dL — ABNORMAL LOW (ref 6.5–8.1)

## 2021-04-22 LAB — CBC WITH DIFFERENTIAL/PLATELET
Abs Immature Granulocytes: 0.14 10*3/uL — ABNORMAL HIGH (ref 0.00–0.07)
Basophils Absolute: 0.1 10*3/uL (ref 0.0–0.1)
Basophils Relative: 1 %
Eosinophils Absolute: 0.1 10*3/uL (ref 0.0–0.5)
Eosinophils Relative: 2 %
HCT: 34.1 % — ABNORMAL LOW (ref 36.0–46.0)
Hemoglobin: 11.2 g/dL — ABNORMAL LOW (ref 12.0–15.0)
Immature Granulocytes: 2 %
Lymphocytes Relative: 28 %
Lymphs Abs: 1.6 10*3/uL (ref 0.7–4.0)
MCH: 27.5 pg (ref 26.0–34.0)
MCHC: 32.8 g/dL (ref 30.0–36.0)
MCV: 83.8 fL (ref 80.0–100.0)
Monocytes Absolute: 0.5 10*3/uL (ref 0.1–1.0)
Monocytes Relative: 8 %
Neutro Abs: 3.4 10*3/uL (ref 1.7–7.7)
Neutrophils Relative %: 59 %
Platelets: 255 10*3/uL (ref 150–400)
RBC: 4.07 MIL/uL (ref 3.87–5.11)
RDW: 16.1 % — ABNORMAL HIGH (ref 11.5–15.5)
WBC: 5.8 10*3/uL (ref 4.0–10.5)
nRBC: 0 % (ref 0.0–0.2)

## 2021-04-22 MED ORDER — TRASTUZUMAB-ANNS CHEMO 150 MG IV SOLR
2.0000 mg/kg | Freq: Once | INTRAVENOUS | Status: AC
  Administered 2021-04-22: 168 mg via INTRAVENOUS
  Filled 2021-04-22: qty 8

## 2021-04-22 MED ORDER — ACETAMINOPHEN 325 MG PO TABS
650.0000 mg | ORAL_TABLET | Freq: Once | ORAL | Status: AC
  Administered 2021-04-22: 650 mg via ORAL
  Filled 2021-04-22: qty 2

## 2021-04-22 MED ORDER — SODIUM CHLORIDE 0.9 % IV SOLN
80.0000 mg/m2 | Freq: Once | INTRAVENOUS | Status: AC
  Administered 2021-04-22: 162 mg via INTRAVENOUS
  Filled 2021-04-22: qty 27

## 2021-04-22 MED ORDER — HEPARIN SOD (PORK) LOCK FLUSH 100 UNIT/ML IV SOLN
500.0000 [IU] | Freq: Once | INTRAVENOUS | Status: AC | PRN
  Administered 2021-04-22: 500 [IU]
  Filled 2021-04-22: qty 5

## 2021-04-22 MED ORDER — SODIUM CHLORIDE 0.9 % IV SOLN
20.0000 mg | Freq: Once | INTRAVENOUS | Status: AC
  Administered 2021-04-22: 20 mg via INTRAVENOUS
  Filled 2021-04-22: qty 20

## 2021-04-22 MED ORDER — DIPHENHYDRAMINE HCL 50 MG/ML IJ SOLN
50.0000 mg | Freq: Once | INTRAMUSCULAR | Status: AC
  Administered 2021-04-22: 50 mg via INTRAVENOUS
  Filled 2021-04-22: qty 1

## 2021-04-22 MED ORDER — HEPARIN SOD (PORK) LOCK FLUSH 100 UNIT/ML IV SOLN
INTRAVENOUS | Status: AC
  Filled 2021-04-22: qty 5

## 2021-04-22 MED ORDER — SODIUM CHLORIDE 0.9 % IV SOLN
Freq: Once | INTRAVENOUS | Status: AC
Start: 2021-04-22 — End: 2021-04-22
  Filled 2021-04-22: qty 250

## 2021-04-22 MED ORDER — FAMOTIDINE 20 MG IN NS 100 ML IVPB
20.0000 mg | Freq: Once | INTRAVENOUS | Status: AC
  Administered 2021-04-22: 20 mg via INTRAVENOUS
  Filled 2021-04-22: qty 20

## 2021-04-22 NOTE — Progress Notes (Signed)
[facility] NOTE  Patient Care Team: [name], MD as PCP - General (Internal Medicine) [name], RN as Registered Nurse  CHIEF COMPLAINTS/PURPOSE OF CONSULTATION: Breast cancer    Oncology History Overview Note  # April 2022-stage Ia mammary carcinoma ER/PR positive HER2 positive [TRIPLE positive]; negative margins s/p simple mastectomy with plan for immediate reconstruction [Dr.Rodenberg/[name]]; NO RT  # May 2nd, 2022- Taxol-Herceptin  # MUGA scan- 64% [03/06/2021]-   # LMP- mid 2020; Uterine ablation- [phone]; # HTN; Asthma- well controlled [on allergy shots];Marland Kitchen    Malignant neoplasm of lower-outer quadrant of left breast of female, estrogen receptor positive ([place])  12/25/2020 Initial Diagnosis   Malignant neoplasm of lower-outer quadrant of left breast of female, estrogen receptor positive ([place])    02/26/2021 Cancer Staging   Staging form: Breast, AJCC 8th Edition - Pathologic: Stage IA (pT1c, pN0, cM0, G3, ER+, PR+, HER2+) - Signed by Cammie Sickle, MD on 02/26/2021  Mitotic count score: Score 3  Histologic grading system: 3 grade system    03/10/2021 -  Chemotherapy    Patient is on Treatment Plan: BREAST PACLITAXEL + TRASTUZUMAB Q7D / TRASTUZUMAB Q21D          HISTORY OF PRESENTING ILLNESS:  Judy Padilla 55 y.o.  female newly diagnosed breast cancer stage I ER/PR positive HER2/neu positive on Taxol Herceptin adjuvant chemotherapy is here for follow-up.  Patient denies any nausea vomiting abdominal pain.  Denies any shortness of breath or cough.  No swelling in the legs.  Fevers or chills.  She continues to work full-time/from home.  Review of Systems  Constitutional:  Negative for chills, diaphoresis, fever, malaise/fatigue and weight loss.  HENT:  Negative for nosebleeds and sore throat.   Eyes:  Negative for double vision.  Respiratory:  Negative for cough, hemoptysis, sputum production, shortness of breath  and wheezing.   Cardiovascular:  Negative for chest pain, palpitations, orthopnea and leg swelling.  Gastrointestinal:  Negative for abdominal pain, blood in stool, constipation, diarrhea, heartburn, melena, nausea and vomiting.  Genitourinary:  Negative for dysuria, frequency and urgency.  Musculoskeletal:  Negative for back pain and joint pain.  Skin: Negative.  Negative for itching and rash.  Neurological:  Negative for dizziness, tingling, focal weakness, weakness and headaches.  Endo/Heme/Allergies:  Does not bruise/bleed easily.  Psychiatric/Behavioral:  Negative for depression. The patient is not nervous/anxious and does not have insomnia.     MEDICAL HISTORY:  Past Medical History:  Diagnosis Date   Asthma    well controlled   Breast cancer ([place])    Family history of adverse reaction to anesthesia    sister-hard time waking up   Family history of breast cancer    Family history of prostate cancer    Family history of uterine cancer    GERD (gastroesophageal reflux disease)    Hypertension     SURGICAL HISTORY: Past Surgical History:  Procedure Laterality Date   ABLATION     BREAST BIOPSY Left 2011   Benign per pt   BREAST BIOPSY Left 12/12/2020   3:30 3 cmfn, Q marker, path pending   BREAST BIOPSY Left 12/12/2020   3:30 1 cmfn, Vision marker, path pending   BREAST RECONSTRUCTION WITH PLACEMENT OF TISSUE EXPANDER AND FLEX HD (ACELLULAR HYDRATED DERMIS) Left 02/10/2021   Procedure: IMMEDIATE LEFT BREAST RECONSTRUCTION WITH PLACEMENT OF TISSUE EXPANDER AND FLEX HD (ACELLULAR HYDRATED DERMIS);  Surgeon: Wallace Going, DO;  Location: [facility] OR[facility];  Service:  Plastics;  Laterality: Left;   COLONOSCOPY  01/15/2020   PORTACATH PLACEMENT Right 02/10/2021   Procedure: INSERTION PORT-A-CATH;  Surgeon: Ronny Bacon, MD;  Location: [facility] OR[facility];  Service: General;  Laterality: Right;   SIMPLE MASTECTOMY WITH AXILLARY SENTINEL NODE BIOPSY Left 02/10/2021   Procedure: SIMPLE MASTECTOMY  WITH AXILLARY SENTINEL NODE BIOPSY;  Surgeon: Ronny Bacon, MD;  Location: [facility] OR[facility];  Service: General;  Laterality: Left;    SOCIAL HISTORY: Social History   Socioeconomic History   Marital status: Married    Spouse name: Not on file   Number of children: Not on file   Years of education: Not on file   Highest education level: Not on file  Occupational History   Not on file  Tobacco Use   Smoking status: Never   Smokeless tobacco: Never  Vaping Use   Vaping Use: Never used  Substance and Sexual Activity   Alcohol use: Not Currently   Drug use: Never   Sexual activity: Not on file  Other Topics Concern   Not on file  Social History Narrative   Lives in [place] with husband; kids- college. Works for [employer]- working from home. No smoking or alcohol.    Social Determinants of Health   Financial Resource Strain: Not on file  Food Insecurity: Not on file  Transportation Needs: Not on file  Physical Activity: Not on file  Stress: Not on file  Social Connections: Not on file  Intimate Partner Violence: Not on file    FAMILY HISTORY: Family History  Problem Relation Age of Onset   Hypertension Mother    Diabetes Mother    Hypertension Father    Diabetes Father    Cancer Father        prostate cancer-70s   Cancer Maternal Grandmother        uterine cancer   Breast cancer Sister        in in 65s.     ALLERGIES:  is allergic to shrimp extract allergy skin test and other.  MEDICATIONS:  Current Outpatient Medications  Medication Sig Dispense Refill   albuterol (VENTOLIN HFA) 108 (90 Base) MCG/ACT inhaler Inhale 1 puff into the lungs every 6 (six) hours as needed for shortness of breath.     amLODipine (NORVASC) 10 MG tablet Take 10 mg by mouth every morning.     budesonide (PULMICORT) 0.5 MG/2ML nebulizer solution Take 2 mLs by nebulization daily.     cetirizine (ZYRTEC) 10 MG tablet Take 10 mg by mouth daily as needed for allergies.      Cholecalciferol 1.25 MG (50000 UT) capsule Take 50,000 Units by mouth once a week.     diazepam (VALIUM) 2 MG tablet Take 1 tablet (2 mg total) by mouth every 12 (twelve) hours as needed for muscle spasms. 20 tablet 0   EPINEPHrine 0.3 mg/0.3 mL IJ SOAJ injection Inject 0.3 mg into the muscle as needed for anaphylaxis.     fluticasone (FLONASE) 50 MCG/ACT nasal spray Place 1 spray into both nostrils daily as needed for allergies.     fluticasone furoate-vilanterol (BREO ELLIPTA) 100-25 MCG/INH AEPB Inhale 1 puff into the lungs as needed for shortness of breath.     GNP BUDESONIDE NASAL SPRAY NA Place 1 spray into the nose daily.     lidocaine-prilocaine (EMLA) cream Apply 30 -45 mins prior to port access. 30 g 0   Multiple Vitamins-Minerals (CENTRUM ADULTS) TABS Take 1 tablet by mouth daily.     naproxen sodium (  ALEVE) 220 MG tablet Take 220 mg by mouth daily as needed (knee pain).     NON FORMULARY Takes weekly allergy shots     ondansetron (ZOFRAN) 4 MG tablet Take 1 tablet (4 mg total) by mouth every 8 (eight) hours as needed for nausea or vomiting. 20 tablet 0   ondansetron (ZOFRAN) 8 MG tablet One pill every 8 hours as needed for nausea/vomitting. 40 tablet 1   pantoprazole (PROTONIX) 40 MG tablet Take 40 mg by mouth every morning.     prochlorperazine (COMPAZINE) 10 MG tablet Take 1 tablet (10 mg total) by mouth every 6 (six) hours as needed for nausea or vomiting. 40 tablet 1   rosuvastatin (CRESTOR) 40 MG tablet Take 40 mg by mouth daily.     No current facility-administered medications for this visit.      Marland Kitchen  PHYSICAL EXAMINATION: ECOG PERFORMANCE STATUS: 0 - Asymptomatic  Vitals:   04/22/21 0830  BP: 130/84  Pulse: 82  Resp: 16  Temp: (!) 96.2 F (35.7 C)  SpO2: 100%   Filed Weights   04/22/21 0830  Weight: 191 lb (86.6 kg)    Physical Exam Constitutional:      Comments: Accompanied by husband.  Ambulating independently.  HENT:     Head: Normocephalic and  atraumatic.     Mouth/Throat:     Pharynx: No oropharyngeal exudate.  Eyes:     Pupils: Pupils are equal, round, and reactive to light.  Cardiovascular:     Rate and Rhythm: Normal rate and regular rhythm.  Pulmonary:     Effort: Pulmonary effort is normal. No respiratory distress.     Breath sounds: Normal breath sounds. No wheezing.  Abdominal:     General: Bowel sounds are normal. There is no distension.     Palpations: Abdomen is soft. There is no mass.     Tenderness: no abdominal tenderness There is no guarding or rebound.  Musculoskeletal:        General: No tenderness. Normal range of motion.     Cervical back: Normal range of motion and neck supple.  Skin:    General: Skin is warm.  Neurological:     Mental Status: She is alert and oriented to person, place, and time.  Psychiatric:        Mood and Affect: Affect normal.     LABORATORY DATA:  I have reviewed the data as listed Lab Results  Component Value Date   WBC 5.8 04/22/2021   HGB 11.2 (L) 04/22/2021   HCT 34.1 (L) 04/22/2021   MCV 83.8 04/22/2021   PLT 255 04/22/2021   Recent Labs    04/08/21 0826 04/15/21 1108 04/22/21 0758  NA 138 136 138  K 4.4 4.1 4.1  CL 105 104 106  CO2 _0 GLUCOSE 102* 112* 101*  BUN 24* 18 21*  CREATININE 1.06* 0.94 1.02*  CALCIUM 9.3 9.1 9.0  GFRNONAA >60 >60 >60  PROT 6.9 6.8 6.4*  ALBUMIN 4.2 4.0 3.8  AST _1 ALT 30 39 36  ALKPHOS 74 71 61  BILITOT 0.8 0.2* 0.5    RADIOGRAPHIC STUDIES: I have personally reviewed the radiological images as listed and agreed with the findings in the report. No results found.  ASSESSMENT & PLAN:   Malignant neoplasm of lower-outer quadrant of left breast of female, estrogen receptor positive ([place]) # Left breast- CA- s/p mastectomy-  pT1c pN0 [Stage IA]Grade 3.  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  On  adjuvant Taxol Herceptin.  #Proceed with cycle #2; d-14 Taxol-Herceprin Labs today reviewed;  acceptable for  treatment today.  Discussed regarding reversible cardiac toxicity of Herceptin.  Currently no signs and symptoms of CHF.  We will plan MUGA scan every 3 to 4 months.  # Genetics: Patient meets the criteria for genetics; sister diagnosed with breast cancer.  S/p genetics; pending results..   # DISPOSITION: # Proceed with Taxol-herceptin  # 1 week- labs- cbc/bmp; taxol-herceptin # follow up in 2 weeks- MD labs- cbc/cmp;Taxol-Herceptin-weekly # in 3 weeks-labs- cbc/bmp; Taxol-herceptin  # 4 week [WED] labs- cbc/bmp; taxol weekly-herceptin Dr.B  All questions were answered. The patient/family knows to call the clinic with any problems, questions or concerns.    Cammie Sickle, MD 04/22/2021 9:12 AM

## 2021-04-22 NOTE — Assessment & Plan Note (Addendum)
#  Left breast- CA- s/p mastectomy-  pT1c pN0 [Stage IA]Grade 3.  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  On adjuvant Taxol Herceptin.  #Proceed with cycle #2; d-14 Taxol-Herceprin Labs today reviewed;  acceptable for treatment today.  Discussed regarding reversible cardiac toxicity of Herceptin.  Currently no signs and symptoms of CHF.  We will plan MUGA scan every 3 to 4 months.  # Genetics: Patient meets the criteria for genetics; sister diagnosed with breast cancer.  S/p genetics; pending results..   # DISPOSITION: # Proceed with Taxol-herceptin  # 1 week- labs- cbc/bmp; taxol-herceptin # follow up in 2 weeks- MD labs- cbc/cmp;Taxol-Herceptin-weekly # in 3 weeks-labs- cbc/bmp; Taxol-herceptin  # 4 week [WED] labs- cbc/bmp; taxol weekly-herceptin Dr.B

## 2021-04-22 NOTE — Patient Instructions (Signed)
[facility]   Discharge Instructions: Thank you for choosing [facility] to provide your oncology and hematology care.  If you have a lab appointment with the [facility], please go directly to the [facility] and check in at the registration area.  Wear comfortable clothing and clothing appropriate for easy access to any Portacath or PICC line.   We strive to give you quality time with your provider. You may need to reschedule your appointment if you arrive late (15 or more minutes).  Arriving late affects you and other patients whose appointments are after yours.  Also, if you miss three or more appointments without notifying the office, you may be dismissed from the clinic at the provider's discretion.      For prescription refill requests, have your pharmacy contact our office and allow 72 hours for refills to be completed.    Today you received the following chemotherapy and/or immunotherapy agents: Trastuzumab-anns (Kanjinti), Taxol.       To help prevent nausea and vomiting after your treatment, we encourage you to take your nausea medication as directed.  BELOW ARE SYMPTOMS THAT SHOULD BE REPORTED IMMEDIATELY: *FEVER GREATER THAN 100.4 F (38 C) OR HIGHER *CHILLS OR SWEATING *NAUSEA AND VOMITING THAT IS NOT CONTROLLED WITH YOUR NAUSEA MEDICATION *UNUSUAL SHORTNESS OF BREATH *UNUSUAL BRUISING OR BLEEDING *URINARY PROBLEMS (pain or burning when urinating, or frequent urination) *BOWEL PROBLEMS (unusual diarrhea, constipation, pain near the anus) TENDERNESS IN MOUTH AND THROAT WITH OR WITHOUT PRESENCE OF ULCERS (sore throat, sores in mouth, or a toothache) UNUSUAL RASH, SWELLING OR PAIN  UNUSUAL VAGINAL DISCHARGE OR ITCHING   Items with * indicate a potential emergency and should be followed up as soon as possible or go to the Emergency Department if any problems should occur.  Please show the CHEMOTHERAPY ALERT CARD or  IMMUNOTHERAPY ALERT CARD at check-in to the Emergency Department and triage nurse.  Should you have questions after your visit or need to cancel or reschedule your appointment, please contact [facility]  [phone] and follow the prompts.  Office hours are 8:00 a.m. to 4:30 p.m. Monday - Friday. Please note that voicemails left after 4:00 p.m. may not be returned until the following business day.  We are closed weekends and major holidays. You have access to a nurse at all times for urgent questions. Please call the main number to the clinic [phone] and follow the prompts.  For any non-urgent questions, you may also contact your provider using MyChart. We now offer e-Visits for anyone 57 and older to request care online for non-urgent symptoms. For details visit [email or web address].   Also download the MyChart app! Go to the app store, search "MyChart", open the app, select [facility], and log in with your MyChart username and password.  Due to Covid, a mask is required upon entering the hospital/clinic. If you do not have a mask, one will be given to you upon arrival. For doctor visits, patients may have 1 support person aged 57 or older with them. For treatment visits, patients cannot have anyone with them due to current Covid guidelines and our immunocompromised population.

## 2021-04-24 NOTE — Progress Notes (Signed)
55 year old female here for follow-up on her left breast reconstruction.  She had immediate breast reconstruction with placement of tissue expander on 02/10/2021 with Dr. Marla Roe.  Approximately 10 weeks postop.  She is currently undergoing chemotherapy, will be finished with chemotherapy late July.  She reports she is doing well.  She has no pain.  She feels comfortable with the left side and think she may not need only 1 more fill or so.  Chaperone present on exam On exam left breast incision is intact.  No subcutaneous fluid collection noted.  No erythema or cellulitic changes.  No wounds noted.  Left breast tissue is soft, expander is soft.  We placed injectable saline in the Expander using a sterile technique: Left: 50 cc for a total of 350 / 535 cc  Recommend following up in 3 weeks to see Dr. Marla Roe to further discuss exchange and for any additional questions.  We did discuss silicone versus saline implants today and patient is leaning towards silicone at this time.  We also discussed possibility for right-sided mastopexy/breast lift to match.  All of her questions were answered to her content.  Recommend calling with questions or concerns

## 2021-04-25 ENCOUNTER — Other Ambulatory Visit: Payer: Self-pay

## 2021-04-25 ENCOUNTER — Ambulatory Visit (INDEPENDENT_AMBULATORY_CARE_PROVIDER_SITE_OTHER): Payer: No Typology Code available for payment source | Admitting: Surgical

## 2021-04-25 DIAGNOSIS — C50512 Malignant neoplasm of lower-outer quadrant of left female breast: Secondary | ICD-10-CM

## 2021-04-25 DIAGNOSIS — Z170 Estrogen receptor positive status [ER+]: Secondary | ICD-10-CM

## 2021-04-29 ENCOUNTER — Ambulatory Visit: Payer: Self-pay | Admitting: Licensed Clinical Social Worker

## 2021-04-29 ENCOUNTER — Encounter: Payer: Self-pay | Admitting: Licensed Clinical Social Worker

## 2021-04-29 ENCOUNTER — Telehealth: Payer: Self-pay | Admitting: Licensed Clinical Social Worker

## 2021-04-29 ENCOUNTER — Inpatient Hospital Stay: Payer: No Typology Code available for payment source

## 2021-04-29 VITALS — BP 131/88 | HR 81 | Temp 97.0°F | Resp 18 | Wt 190.2 lb

## 2021-04-29 DIAGNOSIS — C50512 Malignant neoplasm of lower-outer quadrant of left female breast: Secondary | ICD-10-CM

## 2021-04-29 DIAGNOSIS — Z1379 Encounter for other screening for genetic and chromosomal anomalies: Secondary | ICD-10-CM | POA: Insufficient documentation

## 2021-04-29 DIAGNOSIS — Z170 Estrogen receptor positive status [ER+]: Secondary | ICD-10-CM

## 2021-04-29 DIAGNOSIS — Z8042 Family history of malignant neoplasm of prostate: Secondary | ICD-10-CM

## 2021-04-29 DIAGNOSIS — Z5112 Encounter for antineoplastic immunotherapy: Secondary | ICD-10-CM | POA: Diagnosis not present

## 2021-04-29 DIAGNOSIS — Z8049 Family history of malignant neoplasm of other genital organs: Secondary | ICD-10-CM

## 2021-04-29 DIAGNOSIS — Z803 Family history of malignant neoplasm of breast: Secondary | ICD-10-CM

## 2021-04-29 LAB — COMPREHENSIVE METABOLIC PANEL
ALT: 42 U/L (ref 0–44)
AST: 23 U/L (ref 15–41)
Albumin: 3.8 g/dL (ref 3.5–5.0)
Alkaline Phosphatase: 62 U/L (ref 38–126)
Anion gap: 9 (ref 5–15)
BUN: 18 mg/dL (ref 6–20)
CO2: 25 mmol/L (ref 22–32)
Calcium: 9 mg/dL (ref 8.9–10.3)
Chloride: 104 mmol/L (ref 98–111)
Creatinine, Ser: 0.9 mg/dL (ref 0.44–1.00)
GFR, Estimated: >60 mL/min (ref >60)
Glucose, Bld: 107 mg/dL — ABNORMAL HIGH (ref 70–99)
Potassium: 4.1 mmol/L (ref 3.5–5.1)
Sodium: 138 mmol/L (ref 135–145)
Total Bilirubin: 0.7 mg/dL (ref 0.3–1.2)
Total Protein: 6.5 g/dL (ref 6.5–8.1)

## 2021-04-29 LAB — CBC WITH DIFFERENTIAL/PLATELET
Abs Immature Granulocytes: 0.17 10*3/uL — ABNORMAL HIGH (ref 0.00–0.07)
Basophils Absolute: 0 10*3/uL (ref 0.0–0.1)
Basophils Relative: 1 %
Eosinophils Absolute: 0.1 10*3/uL (ref 0.0–0.5)
Eosinophils Relative: 2 %
HCT: 34.9 % — ABNORMAL LOW (ref 36.0–46.0)
Hemoglobin: 11.4 g/dL — ABNORMAL LOW (ref 12.0–15.0)
Immature Granulocytes: 4 %
Lymphocytes Relative: 27 %
Lymphs Abs: 1.3 10*3/uL (ref 0.7–4.0)
MCH: 27.2 pg (ref 26.0–34.0)
MCHC: 32.7 g/dL (ref 30.0–36.0)
MCV: 83.3 fL (ref 80.0–100.0)
Monocytes Absolute: 0.3 10*3/uL (ref 0.1–1.0)
Monocytes Relative: 6 %
Neutro Abs: 2.9 10*3/uL (ref 1.7–7.7)
Neutrophils Relative %: 60 %
Platelets: 276 10*3/uL (ref 150–400)
RBC: 4.19 MIL/uL (ref 3.87–5.11)
RDW: 16.9 % — ABNORMAL HIGH (ref 11.5–15.5)
WBC: 4.7 10*3/uL (ref 4.0–10.5)
nRBC: 0 % (ref 0.0–0.2)

## 2021-04-29 MED ORDER — HEPARIN SOD (PORK) LOCK FLUSH 100 UNIT/ML IV SOLN
500.0000 [IU] | Freq: Once | INTRAVENOUS | Status: AC | PRN
  Administered 2021-04-29: 500 [IU]
  Filled 2021-04-29: qty 5

## 2021-04-29 MED ORDER — TRASTUZUMAB-ANNS CHEMO 150 MG IV SOLR
2.0000 mg/kg | Freq: Once | INTRAVENOUS | Status: AC
  Administered 2021-04-29: 168 mg via INTRAVENOUS
  Filled 2021-04-29: qty 8

## 2021-04-29 MED ORDER — ACETAMINOPHEN 325 MG PO TABS
650.0000 mg | ORAL_TABLET | Freq: Once | ORAL | Status: AC
Start: 2021-04-29 — End: 2021-04-29
  Administered 2021-04-29: 650 mg via ORAL
  Filled 2021-04-29: qty 2

## 2021-04-29 MED ORDER — FAMOTIDINE 20 MG IN NS 100 ML IVPB
20.0000 mg | Freq: Once | INTRAVENOUS | Status: AC
  Administered 2021-04-29: 20 mg via INTRAVENOUS
  Filled 2021-04-29: qty 20

## 2021-04-29 MED ORDER — DIPHENHYDRAMINE HCL 50 MG/ML IJ SOLN
50.0000 mg | Freq: Once | INTRAMUSCULAR | Status: AC
  Administered 2021-04-29: 50 mg via INTRAVENOUS
  Filled 2021-04-29: qty 1

## 2021-04-29 MED ORDER — SODIUM CHLORIDE 0.9 % IV SOLN
80.0000 mg/m2 | Freq: Once | INTRAVENOUS | Status: AC
  Administered 2021-04-29: 162 mg via INTRAVENOUS
  Filled 2021-04-29: qty 27

## 2021-04-29 MED ORDER — SODIUM CHLORIDE 0.9 % IV SOLN
20.0000 mg | Freq: Once | INTRAVENOUS | Status: AC
  Administered 2021-04-29: 20 mg via INTRAVENOUS
  Filled 2021-04-29: qty 20

## 2021-04-29 MED ORDER — SODIUM CHLORIDE 0.9 % IV SOLN
Freq: Once | INTRAVENOUS | Status: AC
  Filled 2021-04-29: qty 250

## 2021-04-29 NOTE — Patient Instructions (Signed)
[facility]  Discharge Instructions: Thank you for choosing [facility] to provide your oncology and hematology care.  If you have a lab appointment with the [facility], please go directly to the [facility] and check in at the registration area.  Wear comfortable clothing and clothing appropriate for easy access to any Portacath or PICC line.   We strive to give you quality time with your provider. You may need to reschedule your appointment if you arrive late (15 or more minutes).  Arriving late affects you and other patients whose appointments are after yours.  Also, if you miss three or more appointments without notifying the office, you may be dismissed from the clinic at the provider's discretion.      For prescription refill requests, have your pharmacy contact our office and allow 72 hours for refills to be completed.    Today you received the following chemotherapy and/or immunotherapy agents : Herceptin / Taxol    To help prevent nausea and vomiting after your treatment, we encourage you to take your nausea medication as directed.  BELOW ARE SYMPTOMS THAT SHOULD BE REPORTED IMMEDIATELY: *FEVER GREATER THAN 100.4 F (38 C) OR HIGHER *CHILLS OR SWEATING *NAUSEA AND VOMITING THAT IS NOT CONTROLLED WITH YOUR NAUSEA MEDICATION *UNUSUAL SHORTNESS OF BREATH *UNUSUAL BRUISING OR BLEEDING *URINARY PROBLEMS (pain or burning when urinating, or frequent urination) *BOWEL PROBLEMS (unusual diarrhea, constipation, pain near the anus) TENDERNESS IN MOUTH AND THROAT WITH OR WITHOUT PRESENCE OF ULCERS (sore throat, sores in mouth, or a toothache) UNUSUAL RASH, SWELLING OR PAIN  UNUSUAL VAGINAL DISCHARGE OR ITCHING   Items with * indicate a potential emergency and should be followed up as soon as possible or go to the Emergency Department if any problems should occur.  Please show the CHEMOTHERAPY ALERT CARD or IMMUNOTHERAPY ALERT CARD at  check-in to the Emergency Department and triage nurse.  Should you have questions after your visit or need to cancel or reschedule your appointment, please contact [facility]  [phone] and follow the prompts.  Office hours are 8:00 a.m. to 4:30 p.m. Monday - Friday. Please note that voicemails left after 4:00 p.m. may not be returned until the following business day.  We are closed weekends and major holidays. You have access to a nurse at all times for urgent questions. Please call the main number to the clinic [phone] and follow the prompts.  For any non-urgent questions, you may also contact your provider using MyChart. We now offer e-Visits for anyone 72 and older to request care online for non-urgent symptoms. For details visit [email or web address].   Also download the MyChart app! Go to the app store, search "MyChart", open the app, select [facility], and log in with your MyChart username and password.  Due to Covid, a mask is required upon entering the hospital/clinic. If you do not have a mask, one will be given to you upon arrival. For doctor visits, patients may have 1 support person aged 59 or older with them. For treatment visits, patients cannot have anyone with them due to current Covid guidelines and our immunocompromised population.

## 2021-04-29 NOTE — Progress Notes (Signed)
HPI:  Judy Padilla was previously seen in the [place] clinic due to a personal and family history of cancer and concerns regarding a hereditary predisposition to cancer. Please refer to our prior cancer genetics clinic note for more information regarding our discussion, assessment and recommendations, at the time. Judy Padilla recent genetic test results were disclosed to her, as were recommendations warranted by these results. These results and recommendations are discussed in more detail below.  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]; negative margins s/p simple mastectomy with plan for immediate reconstruction [Dr.Rodenberg/[name]]; NO RT  # May 2nd, 2022- Taxol-Herceptin  # MUGA scan- 64% [03/06/2021]-   # LMP- mid 2020; Uterine ablation- [phone]; # HTN; Asthma- well controlled [on allergy shots];Marland Kitchen    Malignant neoplasm of lower-outer quadrant of left breast of female, estrogen receptor positive ([place])  12/25/2020 Initial Diagnosis   Malignant neoplasm of lower-outer quadrant of left breast of female, estrogen receptor positive ([place])    02/26/2021 Cancer Staging   Staging form: Breast, AJCC 8th Edition - Pathologic: Stage IA (pT1c, pN0, cM0, G3, ER+, PR+, HER2+) - Signed by Cammie Sickle, MD on 02/26/2021  Mitotic count score: Score 3  Histologic grading system: 3 grade system    03/10/2021 -  Chemotherapy    Patient is on Treatment Plan: BREAST PACLITAXEL + TRASTUZUMAB Q7D / TRASTUZUMAB Q21D        Genetic Testing   Negative genetic testing. No pathogenic variants identified on the [facility] Multi-Cancer+RNA panel. 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 The report date is 04/29/2021.  The Multi-Cancer Panel + RNA offered by [facility] includes sequencing and/or deletion duplication testing of the following 84 genes: AIP, ALK, APC, ATM, AXIN2,BAP1,  BARD1, BLM,  BMPR1A, BRCA1, BRCA2, BRIP1, CASR, CDC73, CDH1, CDK4, CDKN1B, CDKN1C, CDKN2A (p14ARF), CDKN2A (p16INK4a), CEBPA, CHEK2, CTNNA1, DICER1, DIS3L2, EGFR (c.2369C>T, p.Thr790Met variant only), EPCAM (Deletion/duplication testing only), FH, FLCN, GATA2, GPC3, GREM1 (Promoter region deletion/duplication testing only), HOXB13 (c.251G>A, p.Gly84Glu), HRAS, KIT, MAX, MEN1, MET, MITF (c.952G>A, p.Glu318Lys variant only), MLH1, MSH2, MSH3, MSH6, MUTYH, NBN, NF1, NF2, NTHL1, PALB2, PDGFRA, PHOX2B, PMS2, POLD1, POLE, POT1, PRKAR1A, PTCH1, PTEN, RAD50, RAD51C, RAD51D, RB1, RECQL4, RET, RUNX1, SDHAF2, SDHA (sequence changes only), SDHB, SDHC, SDHD, SMAD4, SMARCA4, SMARCB1, SMARCE1, STK11, SUFU, TERC, TERT, TMEM127, TP53, TSC1, TSC2, VHL, WRN and WT1.     FAMILY HISTORY:  We obtained a detailed, 4-generation family history.  Significant diagnoses are listed below: Family History  Problem Relation Age of Onset   Hypertension Mother    Diabetes Mother    Hypertension Father    Diabetes Father    Cancer Father        prostate cancer-70s   Cancer Maternal Grandmother        uterine cancer   Breast cancer Sister        in in 51s.    Judy Padilla has 1 daughter and two step children. She has 2 full sisters, 1 maternal half brother, 1 maternal half sister, 2 paternal half brothers and 1 paternal half sister. Her paternal half sister had breast cancer in her 47s and is living at 31.   Judy Padilla mother is living at 73. Patient has limited information about this side of the family. No cancers for aunts/uncles/cousins. Maternal grandmother had uterine cancer and died in her 76s of it. Her grandfather possibly had cancer, died in his 82s.   Judy Padilla  father had prostate cancer in his 59s, died in his 62s. No cancers for aunts, uncles, cousins, or grandparents on this side.    Judy Padilla is unaware of previous family history of genetic testing for hereditary cancer risks. Patient's maternal and paternal sides of the  family are from [place]. There is no reported Ashkenazi Jewish ancestry. There is no known consanguinity.      GENETIC TEST RESULTS: Genetic testing reported out on 04/29/2021 through the [facility] Multi-Cancer+RNA cancer panel found no pathogenic mutations.   The Multi-Cancer Panel + RNA offered by [facility] includes sequencing and/or deletion duplication testing of the following 84 genes: AIP, ALK, APC, ATM, AXIN2,BAP1,  BARD1, BLM, BMPR1A, BRCA1, BRCA2, BRIP1, CASR, CDC73, CDH1, CDK4, CDKN1B, CDKN1C, CDKN2A (p14ARF), CDKN2A (p16INK4a), CEBPA, CHEK2, CTNNA1, DICER1, DIS3L2, EGFR (c.2369C>T, p.Thr790Met variant only), EPCAM (Deletion/duplication testing only), FH, FLCN, GATA2, GPC3, GREM1 (Promoter region deletion/duplication testing only), HOXB13 (c.251G>A, p.Gly84Glu), HRAS, KIT, MAX, MEN1, MET, MITF (c.952G>A, p.Glu318Lys variant only), MLH1, MSH2, MSH3, MSH6, MUTYH, NBN, NF1, NF2, NTHL1, PALB2, PDGFRA, PHOX2B, PMS2, POLD1, POLE, POT1, PRKAR1A, PTCH1, PTEN, RAD50, RAD51C, RAD51D, RB1, RECQL4, RET, RUNX1, SDHAF2, SDHA (sequence changes only), SDHB, SDHC, SDHD, SMAD4, SMARCA4, SMARCB1, SMARCE1, STK11, SUFU, TERC, TERT, TMEM127, TP53, TSC1, TSC2, VHL, WRN and WT1.   The test report has been scanned into EPIC and is located under the Molecular Pathology section of the Results Review tab.  A portion of the result report is included below for reference.     We discussed that because current genetic testing is not perfect, it is possible there may be a gene mutation in one of these genes that current testing cannot detect, but that chance is small.  There could be another gene that has not yet been discovered, or that we have not yet tested, that is responsible for the cancer diagnoses in the family. It is also possible there is a hereditary cause for the cancer in the family that Judy Padilla did not inherit and therefore was not identified in her testing.  Therefore, it is important to remain in touch with  cancer genetics in the future so that we can continue to offer Judy Padilla the most up to date genetic testing.   Genetic testing did identify a variant of uncertain significance (VUS) in the BRIP1 gene called c.854A>G.  At this time, it is unknown if this variant is associated with increased cancer risk or if this is a normal finding, but most variants such as this get reclassified to being inconsequential. It should not be used to make medical management decisions. With time, we suspect the lab will determine the significance of this variant, if any. If we do learn more about it we will try to contact Judy Padilla to discuss it further. However, it is important to stay in touch with [place] periodically and keep the address and phone number up to date.  ADDITIONAL GENETIC TESTING: We discussed with Judy Padilla that her genetic testing was fairly extensive.  If there are genes identified to increase cancer risk that can be analyzed in the future, we would be happy to discuss and coordinate this testing at that time.    CANCER SCREENING RECOMMENDATIONS: Judy Padilla test result is considered negative (normal).  This means that we have not identified a hereditary cause for her  personal and family history of cancer at this time. Most cancers happen by chance and this negative test suggests that her cancer may fall into this category.  While reassuring, this does not definitively rule out a hereditary predisposition to cancer. It is still possible that there could be genetic mutations that are undetectable by current technology. There could be genetic mutations in genes that have not been tested or identified to increase cancer risk.  Therefore, it is recommended she continue to follow the cancer management and screening guidelines provided by her oncology and primary healthcare provider.   An individual's cancer risk and medical management are not determined by genetic test results alone. Overall cancer risk  assessment incorporates additional factors, including personal medical history, family history, and any available genetic information that may result in a personalized plan for cancer prevention and surveillance.  RECOMMENDATIONS FOR FAMILY MEMBERS:  Relatives in this family might be at some increased risk of developing cancer, over the general population risk, simply due to the family history of cancer.  We recommended female relatives in this family have a yearly mammogram beginning at age 18, or 4 years younger than the earliest onset of cancer, an annual clinical breast exam, and perform monthly breast self-exams. Female relatives in this family should also have a gynecological exam as recommended by their primary provider.  All family members should be referred for colonoscopy starting at age 48.   FOLLOW-UP: Lastly, we discussed with Judy Padilla that cancer genetics is a rapidly advancing field and it is possible that new genetic tests will be appropriate for her and/or her family members in the future. We encouraged her to remain in contact with cancer genetics on an annual basis so we can update her personal and family histories and let her know of advances in cancer genetics that may benefit this family.   Our contact number was provided. Judy Padilla questions were answered to her satisfaction, and she knows she is welcome to call us at anytime with additional questions or concerns.   Faith Rogue, MS, [facility] Genetic Counselor [email or web address] Phone: [phone]

## 2021-04-29 NOTE — Telephone Encounter (Signed)
Revealed negative genetic testing.  Revealed that a VUS in BRIP1 was identified. This normal result is reassuring and indicates that it is unlikely Judy Padilla's cancer is due to a hereditary cause.  It is unlikely that there is an increased risk of another cancer due to a mutation in one of these genes.  However, genetic testing is not perfect, and cannot definitively rule out a hereditary cause.  It will be important for her to keep in contact with genetics to learn if any additional testing may be needed in the future.

## 2021-05-01 DIAGNOSIS — J301 Allergic rhinitis due to pollen: Secondary | ICD-10-CM | POA: Diagnosis not present

## 2021-05-06 ENCOUNTER — Encounter: Payer: Self-pay | Admitting: Internal Medicine

## 2021-05-06 ENCOUNTER — Inpatient Hospital Stay: Payer: No Typology Code available for payment source

## 2021-05-06 ENCOUNTER — Inpatient Hospital Stay (HOSPITAL_BASED_OUTPATIENT_CLINIC_OR_DEPARTMENT_OTHER): Payer: No Typology Code available for payment source | Admitting: Internal Medicine

## 2021-05-06 DIAGNOSIS — C50512 Malignant neoplasm of lower-outer quadrant of left female breast: Secondary | ICD-10-CM | POA: Diagnosis not present

## 2021-05-06 DIAGNOSIS — Z170 Estrogen receptor positive status [ER+]: Secondary | ICD-10-CM | POA: Diagnosis not present

## 2021-05-06 DIAGNOSIS — Z5112 Encounter for antineoplastic immunotherapy: Secondary | ICD-10-CM | POA: Diagnosis not present

## 2021-05-06 LAB — CBC WITH DIFFERENTIAL/PLATELET
Abs Immature Granulocytes: 0.25 10*3/uL — ABNORMAL HIGH (ref 0.00–0.07)
Basophils Absolute: 0.1 10*3/uL (ref 0.0–0.1)
Basophils Relative: 1 %
Eosinophils Absolute: 0.1 10*3/uL (ref 0.0–0.5)
Eosinophils Relative: 1 %
HCT: 34 % — ABNORMAL LOW (ref 36.0–46.0)
Hemoglobin: 11 g/dL — ABNORMAL LOW (ref 12.0–15.0)
Immature Granulocytes: 5 %
Lymphocytes Relative: 27 %
Lymphs Abs: 1.3 10*3/uL (ref 0.7–4.0)
MCH: 27.3 pg (ref 26.0–34.0)
MCHC: 32.4 g/dL (ref 30.0–36.0)
MCV: 84.4 fL (ref 80.0–100.0)
Monocytes Absolute: 0.4 10*3/uL (ref 0.1–1.0)
Monocytes Relative: 8 %
Neutro Abs: 2.8 10*3/uL (ref 1.7–7.7)
Neutrophils Relative %: 58 %
Platelets: 276 10*3/uL (ref 150–400)
RBC: 4.03 MIL/uL (ref 3.87–5.11)
RDW: 17 % — ABNORMAL HIGH (ref 11.5–15.5)
WBC: 4.9 10*3/uL (ref 4.0–10.5)
nRBC: 0 % (ref 0.0–0.2)

## 2021-05-06 LAB — COMPREHENSIVE METABOLIC PANEL
ALT: 49 U/L — ABNORMAL HIGH (ref 0–44)
AST: 25 U/L (ref 15–41)
Albumin: 3.8 g/dL (ref 3.5–5.0)
Alkaline Phosphatase: 61 U/L (ref 38–126)
Anion gap: 6 (ref 5–15)
BUN: 20 mg/dL (ref 6–20)
CO2: 24 mmol/L (ref 22–32)
Calcium: 9.1 mg/dL (ref 8.9–10.3)
Chloride: 108 mmol/L (ref 98–111)
Creatinine, Ser: 0.82 mg/dL (ref 0.44–1.00)
GFR, Estimated: >60 mL/min (ref >60)
Glucose, Bld: 103 mg/dL — ABNORMAL HIGH (ref 70–99)
Potassium: 3.9 mmol/L (ref 3.5–5.1)
Sodium: 138 mmol/L (ref 135–145)
Total Bilirubin: 0.6 mg/dL (ref 0.3–1.2)
Total Protein: 6.3 g/dL — ABNORMAL LOW (ref 6.5–8.1)

## 2021-05-06 MED ORDER — ACETAMINOPHEN 325 MG PO TABS
650.0000 mg | ORAL_TABLET | Freq: Once | ORAL | Status: AC
  Administered 2021-05-06: 650 mg via ORAL
  Filled 2021-05-06: qty 2

## 2021-05-06 MED ORDER — HEPARIN SOD (PORK) LOCK FLUSH 100 UNIT/ML IV SOLN
INTRAVENOUS | Status: AC
  Filled 2021-05-06: qty 5

## 2021-05-06 MED ORDER — HEPARIN SOD (PORK) LOCK FLUSH 100 UNIT/ML IV SOLN
500.0000 [IU] | Freq: Once | INTRAVENOUS | Status: AC
  Administered 2021-05-06: 500 [IU] via INTRAVENOUS
  Filled 2021-05-06: qty 5

## 2021-05-06 MED ORDER — SODIUM CHLORIDE 0.9 % IV SOLN
20.0000 mg | Freq: Once | INTRAVENOUS | Status: AC
Start: 2021-05-06 — End: 2021-05-06
  Administered 2021-05-06: 20 mg via INTRAVENOUS
  Filled 2021-05-06: qty 20

## 2021-05-06 MED ORDER — DIPHENHYDRAMINE HCL 50 MG/ML IJ SOLN
50.0000 mg | Freq: Once | INTRAMUSCULAR | Status: AC
  Administered 2021-05-06: 50 mg via INTRAVENOUS
  Filled 2021-05-06: qty 1

## 2021-05-06 MED ORDER — TRASTUZUMAB-ANNS CHEMO 150 MG IV SOLR
2.0000 mg/kg | Freq: Once | INTRAVENOUS | Status: AC
  Administered 2021-05-06: 168 mg via INTRAVENOUS
  Filled 2021-05-06: qty 8

## 2021-05-06 MED ORDER — SODIUM CHLORIDE 0.9 % IV SOLN
Freq: Once | INTRAVENOUS | Status: AC
  Filled 2021-05-06: qty 250

## 2021-05-06 MED ORDER — SODIUM CHLORIDE 0.9 % IV SOLN
80.0000 mg/m2 | Freq: Once | INTRAVENOUS | Status: AC
  Administered 2021-05-06: 162 mg via INTRAVENOUS
  Filled 2021-05-06: qty 27

## 2021-05-06 MED ORDER — SODIUM CHLORIDE 0.9% FLUSH
10.0000 mL | INTRAVENOUS | Status: DC | PRN
  Administered 2021-05-06: 10 mL via INTRAVENOUS
  Filled 2021-05-06: qty 10

## 2021-05-06 MED ORDER — HEPARIN SOD (PORK) LOCK FLUSH 100 UNIT/ML IV SOLN
500.0000 [IU] | Freq: Once | INTRAVENOUS | Status: DC | PRN
  Filled 2021-05-06: qty 5

## 2021-05-06 MED ORDER — FAMOTIDINE 20 MG IN NS 100 ML IVPB
20.0000 mg | Freq: Once | INTRAVENOUS | Status: AC
Start: 2021-05-06 — End: 2021-05-06
  Administered 2021-05-06: 20 mg via INTRAVENOUS
  Filled 2021-05-06: qty 20

## 2021-05-06 NOTE — Progress Notes (Signed)
Noted that she has had very dry and bloody nasal passages that she wonders is chemo related.

## 2021-05-06 NOTE — Patient Instructions (Signed)
[facility] ONCOLOGY  Discharge Instructions: Thank you for choosing [facility] to provide your oncology and hematology care.  If you have a lab appointment with the [facility], please go directly to the [facility] and check in at the registration area.  Wear comfortable clothing and clothing appropriate for easy access to any Portacath or PICC line.   We strive to give you quality time with your provider. You may need to reschedule your appointment if you arrive late (15 or more minutes).  Arriving late affects you and other patients whose appointments are after yours.  Also, if you miss three or more appointments without notifying the office, you may be dismissed from the clinic at the provider's discretion.      For prescription refill requests, have your pharmacy contact our office and allow 72 hours for refills to be completed.    Today you received the following chemotherapy and/or immunotherapy agents Taxol, Kanjinti    To help prevent nausea and vomiting after your treatment, we encourage you to take your nausea medication as directed.  BELOW ARE SYMPTOMS THAT SHOULD BE REPORTED IMMEDIATELY: *FEVER GREATER THAN 100.4 F (38 C) OR HIGHER *CHILLS OR SWEATING *NAUSEA AND VOMITING THAT IS NOT CONTROLLED WITH YOUR NAUSEA MEDICATION *UNUSUAL SHORTNESS OF BREATH *UNUSUAL BRUISING OR BLEEDING *URINARY PROBLEMS (pain or burning when urinating, or frequent urination) *BOWEL PROBLEMS (unusual diarrhea, constipation, pain near the anus) TENDERNESS IN MOUTH AND THROAT WITH OR WITHOUT PRESENCE OF ULCERS (sore throat, sores in mouth, or a toothache) UNUSUAL RASH, SWELLING OR PAIN  UNUSUAL VAGINAL DISCHARGE OR ITCHING   Items with * indicate a potential emergency and should be followed up as soon as possible or go to the Emergency Department if any problems should occur.  Please show the CHEMOTHERAPY ALERT CARD or IMMUNOTHERAPY ALERT CARD at  check-in to the Emergency Department and triage nurse.  Should you have questions after your visit or need to cancel or reschedule your appointment, please contact [facility]  [phone] and follow the prompts.  Office hours are 8:00 a.m. to 4:30 p.m. Monday - Friday. Please note that voicemails left after 4:00 p.m. may not be returned until the following business day.  We are closed weekends and major holidays. You have access to a nurse at all times for urgent questions. Please call the main number to the clinic [phone] and follow the prompts.  For any non-urgent questions, you may also contact your provider using MyChart. We now offer e-Visits for anyone 75 and older to request care online for non-urgent symptoms. For details visit [email or web address].   Also download the MyChart app! Go to the app store, search "MyChart", open the app, select [facility], and log in with your MyChart username and password.  Due to Covid, a mask is required upon entering the hospital/clinic. If you do not have a mask, one will be given to you upon arrival. For doctor visits, patients may have 1 support person aged 37 or older with them. For treatment visits, patients cannot have anyone with them due to current Covid guidelines and our immunocompromised population.

## 2021-05-06 NOTE — Assessment & Plan Note (Addendum)
#  Left breast- CA- s/p mastectomy-  pT1c pN0 [Stage IA]Grade 3.  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  On adjuvant Taxol Herceptin.  #Proceed with cycle #3; d-1 today Taxol-Herceprin Labs today reviewed;  acceptable for treatment today. We will plan MUGA scan every 3 to 4 months.  # Genetics: Reviewed genetics results s/p genetics: NEGATIVE; VUS*   # DISPOSITION: # Proceed with Taxol-herceptin today # keep other appts as planned- Dr.B

## 2021-05-06 NOTE — Progress Notes (Signed)
[name] NOTE  Patient Care Team: [name], MD as PCP - General (Internal Medicine) [name], RN as Registered Nurse  CHIEF COMPLAINTS/PURPOSE OF CONSULTATION: Breast cancer    Oncology History Overview Note  # April 2022-stage Ia mammary carcinoma ER/PR positive HER2 positive [TRIPLE positive]; negative margins s/p simple mastectomy with plan for immediate reconstruction [Dr.Rodenberg/[name]]; NO RT  # May 2nd, 2022- Taxol-Herceptin  # MUGA scan- 64% [03/06/2021]-   # LMP- mid 2020; Uterine ablation- [phone]; # HTN; Asthma- well controlled [on allergy shots];Marland Kitchen    Malignant neoplasm of lower-outer quadrant of left breast of female, estrogen receptor positive ([place])  12/25/2020 Initial Diagnosis   Malignant neoplasm of lower-outer quadrant of left breast of female, estrogen receptor positive ([place])    02/26/2021 Cancer Staging   Staging form: Breast, AJCC 8th Edition - Pathologic: Stage IA (pT1c, pN0, cM0, G3, ER+, PR+, HER2+) - Signed by Cammie Sickle, MD on 02/26/2021  Mitotic count score: Score 3  Histologic grading system: 3 grade system    03/10/2021 -  Chemotherapy    Patient is on Treatment Plan: BREAST PACLITAXEL + TRASTUZUMAB Q7D / TRASTUZUMAB Q21D        Genetic Testing   Negative genetic testing. No pathogenic variants identified on the [facility] Multi-Cancer+RNA panel. 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 The report date is 04/29/2021.  The Multi-Cancer Panel + RNA offered by [facility] includes sequencing and/or deletion duplication testing of the following 84 genes: AIP, ALK, APC, ATM, AXIN2,BAP1,  BARD1, BLM, BMPR1A, BRCA1, BRCA2, BRIP1, CASR, CDC73, CDH1, CDK4, CDKN1B, CDKN1C, CDKN2A (p14ARF), CDKN2A (p16INK4a), CEBPA, CHEK2, CTNNA1, DICER1, DIS3L2, EGFR (c.2369C>T, p.Thr790Met variant only), EPCAM (Deletion/duplication testing only), FH, FLCN, GATA2, GPC3, GREM1 (Promoter region deletion/duplication testing only),  HOXB13 (c.251G>A, p.Gly84Glu), HRAS, KIT, MAX, MEN1, MET, MITF (c.952G>A, p.Glu318Lys variant only), MLH1, MSH2, MSH3, MSH6, MUTYH, NBN, NF1, NF2, NTHL1, PALB2, PDGFRA, PHOX2B, PMS2, POLD1, POLE, POT1, PRKAR1A, PTCH1, PTEN, RAD50, RAD51C, RAD51D, RB1, RECQL4, RET, RUNX1, SDHAF2, SDHA (sequence changes only), SDHB, SDHC, SDHD, SMAD4, SMARCA4, SMARCB1, SMARCE1, STK11, SUFU, TERC, TERT, TMEM127, TP53, TSC1, TSC2, VHL, WRN and WT1.      HISTORY OF PRESENTING ILLNESS:  Judy Padilla 55 y.o.  female newly diagnosed breast cancer stage I ER/PR positive HER2/neu positive on Taxol Herceptin adjuvant chemotherapy is here for follow-up.  Patient denies any worsening tingling and numbness in extremities.  Denies any cramping.  No swelling in the legs.  No nausea no vomiting no chills.  She continues to work full-time from home  Review of Systems  Constitutional:  Negative for chills, diaphoresis, fever, malaise/fatigue and weight loss.  HENT:  Negative for nosebleeds and sore throat.   Eyes:  Negative for double vision.  Respiratory:  Negative for cough, hemoptysis, sputum production, shortness of breath and wheezing.   Cardiovascular:  Negative for chest pain, palpitations, orthopnea and leg swelling.  Gastrointestinal:  Negative for abdominal pain, blood in stool, constipation, diarrhea, heartburn, melena, nausea and vomiting.  Genitourinary:  Negative for dysuria, frequency and urgency.  Musculoskeletal:  Negative for back pain and joint pain.  Skin: Negative.  Negative for itching and rash.  Neurological:  Negative for dizziness, tingling, focal weakness, weakness and headaches.  Endo/Heme/Allergies:  Does not bruise/bleed easily.  Psychiatric/Behavioral:  Negative for depression. The patient is not nervous/anxious and does not have insomnia.     MEDICAL HISTORY:  Past Medical History:  Diagnosis Date   Asthma    well controlled  Breast cancer ([place])    Family history of adverse reaction to  anesthesia    sister-hard time waking up   Family history of breast cancer    Family history of prostate cancer    Family history of uterine cancer    GERD (gastroesophageal reflux disease)    Hypertension     SURGICAL HISTORY: Past Surgical History:  Procedure Laterality Date   ABLATION     BREAST BIOPSY Left 2011   Benign per pt   BREAST BIOPSY Left 12/12/2020   3:30 3 cmfn, Q marker, path pending   BREAST BIOPSY Left 12/12/2020   3:30 1 cmfn, Vision marker, path pending   BREAST RECONSTRUCTION WITH PLACEMENT OF TISSUE EXPANDER AND FLEX HD (ACELLULAR HYDRATED DERMIS) Left 02/10/2021   Procedure: IMMEDIATE LEFT BREAST RECONSTRUCTION WITH PLACEMENT OF TISSUE EXPANDER AND FLEX HD (ACELLULAR HYDRATED DERMIS);  Surgeon: Wallace Going, DO;  Location: [facility];  Service: Plastics;  Laterality: Left;   COLONOSCOPY  01/15/2020   PORTACATH PLACEMENT Right 02/10/2021   Procedure: INSERTION PORT-A-CATH;  Surgeon: Ronny Bacon, MD;  Location: [facility];  Service: General;  Laterality: Right;   SIMPLE MASTECTOMY WITH AXILLARY SENTINEL NODE BIOPSY Left 02/10/2021   Procedure: SIMPLE MASTECTOMY WITH AXILLARY SENTINEL NODE BIOPSY;  Surgeon: Ronny Bacon, MD;  Location: [facility];  Service: General;  Laterality: Left;    SOCIAL HISTORY: Social History   Socioeconomic History   Marital status: Married    Spouse name: Not on file   Number of children: Not on file   Years of education: Not on file   Highest education level: Not on file  Occupational History   Not on file  Tobacco Use   Smoking status: Never   Smokeless tobacco: Never  Vaping Use   Vaping Use: Never used  Substance and Sexual Activity   Alcohol use: Not Currently   Drug use: Never   Sexual activity: Not on file  Other Topics Concern   Not on file  Social History Narrative   Lives in [place] with husband; kids- college. Works for [employer]- working from home. No smoking or alcohol.     Social Determinants of Health   Financial Resource Strain: Not on file  Food Insecurity: Not on file  Transportation Needs: Not on file  Physical Activity: Not on file  Stress: Not on file  Social Connections: Not on file  Intimate Partner Violence: Not on file    FAMILY HISTORY: Family History  Problem Relation Age of Onset   Hypertension Mother    Diabetes Mother    Hypertension Father    Diabetes Father    Cancer Father        prostate cancer-70s   Cancer Maternal Grandmother        uterine cancer   Breast cancer Sister        in in 50s.     ALLERGIES:  is allergic to shrimp extract allergy skin test and other.  MEDICATIONS:  Current Outpatient Medications  Medication Sig Dispense Refill   albuterol (VENTOLIN HFA) 108 (90 Base) MCG/ACT inhaler Inhale 1 puff into the lungs every 6 (six) hours as needed for shortness of breath.     amLODipine (NORVASC) 10 MG tablet Take 10 mg by mouth every morning.     budesonide (PULMICORT) 0.5 MG/2ML nebulizer solution Take 2 mLs by nebulization daily.     cetirizine (ZYRTEC) 10 MG tablet Take 10 mg by mouth daily as needed for allergies.  Cholecalciferol 1.25 MG (50000 UT) capsule Take 50,000 Units by mouth once a week.     diazepam (VALIUM) 2 MG tablet Take 1 tablet (2 mg total) by mouth every 12 (twelve) hours as needed for muscle spasms. 20 tablet 0   EPINEPHrine 0.3 mg/0.3 mL IJ SOAJ injection Inject 0.3 mg into the muscle as needed for anaphylaxis.     fluticasone (FLONASE) 50 MCG/ACT nasal spray Place 1 spray into both nostrils daily as needed for allergies.     fluticasone furoate-vilanterol (BREO ELLIPTA) 100-25 MCG/INH AEPB Inhale 1 puff into the lungs as needed for shortness of breath.     GNP BUDESONIDE NASAL SPRAY NA Place 1 spray into the nose daily.     lidocaine-prilocaine (EMLA) cream Apply 30 -45 mins prior to port access. 30 g 0   Multiple Vitamins-Minerals (CENTRUM ADULTS) TABS Take 1 tablet by mouth daily.      naproxen sodium (ALEVE) 220 MG tablet Take 220 mg by mouth daily as needed (knee pain).     NON FORMULARY Takes weekly allergy shots     ondansetron (ZOFRAN) 4 MG tablet Take 1 tablet (4 mg total) by mouth every 8 (eight) hours as needed for nausea or vomiting. 20 tablet 0   ondansetron (ZOFRAN) 8 MG tablet One pill every 8 hours as needed for nausea/vomitting. 40 tablet 1   pantoprazole (PROTONIX) 40 MG tablet Take 40 mg by mouth every morning.     prochlorperazine (COMPAZINE) 10 MG tablet Take 1 tablet (10 mg total) by mouth every 6 (six) hours as needed for nausea or vomiting. 40 tablet 1   rosuvastatin (CRESTOR) 40 MG tablet Take 40 mg by mouth daily.     No current facility-administered medications for this visit.      Marland Kitchen  PHYSICAL EXAMINATION: ECOG PERFORMANCE STATUS: 0 - Asymptomatic  Vitals:   05/06/21 0848  BP: 124/84  Pulse: 77  Resp: 16  Temp: (!) 96.2 F (35.7 C)  SpO2: 100%   Filed Weights   05/06/21 0848  Weight: 191 lb (86.6 kg)    Physical Exam Constitutional:      Comments: Accompanied by husband.  Ambulating independently.  HENT:     Head: Normocephalic and atraumatic.     Mouth/Throat:     Pharynx: No oropharyngeal exudate.  Eyes:     Pupils: Pupils are equal, round, and reactive to light.  Cardiovascular:     Rate and Rhythm: Normal rate and regular rhythm.  Pulmonary:     Effort: Pulmonary effort is normal. No respiratory distress.     Breath sounds: Normal breath sounds. No wheezing.  Abdominal:     General: Bowel sounds are normal. There is no distension.     Palpations: Abdomen is soft. There is no mass.     Tenderness: no abdominal tenderness There is no guarding or rebound.  Musculoskeletal:        General: No tenderness. Normal range of motion.     Cervical back: Normal range of motion and neck supple.  Skin:    General: Skin is warm.  Neurological:     Mental Status: She is alert and oriented to person, place, and time.   Psychiatric:        Mood and Affect: Affect normal.     LABORATORY DATA:  I have reviewed the data as listed Lab Results  Component Value Date   WBC 4.9 05/06/2021   HGB 11.0 (L) 05/06/2021   HCT 34.0 (L) 05/06/2021  MCV 84.4 05/06/2021   PLT 276 05/06/2021   Recent Labs    04/22/21 0758 04/29/21 0814 05/06/21 0821  NA 138 138 138  K 4.1 4.1 3.9  CL 106 104 108  CO2 '25 25 24  ' GLUCOSE 101* 107* 103*  BUN 21* 18 20  CREATININE 1.02* 0.90 0.82  CALCIUM 9.0 9.0 9.1  GFRNONAA >60 >60 >60  PROT 6.4* 6.5 6.3*  ALBUMIN 3.8 3.8 3.8  AST '18 23 25  ' ALT 36 42 49*  ALKPHOS 61 62 61  BILITOT 0.5 0.7 0.6    RADIOGRAPHIC STUDIES: I have personally reviewed the radiological images as listed and agreed with the findings in the report. No results found.  ASSESSMENT & PLAN:   Malignant neoplasm of lower-outer quadrant of left breast of female, estrogen receptor positive ([place]) # Left breast- CA- s/p mastectomy-  pT1c pN0 [Stage IA]Grade 3.  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  On adjuvant Taxol Herceptin.  #Proceed with cycle #3; d-1 today Taxol-Herceprin Labs today reviewed;  acceptable for treatment today. We will plan MUGA scan every 3 to 4 months.  # Genetics: Reviewed genetics results s/p genetics: NEGATIVE; VUS*   # DISPOSITION: # Proceed with Taxol-herceptin today # keep other appts as planned- Dr.B  All questions were answered. The patient/family knows to call the clinic with any problems, questions or concerns.    Cammie Sickle, MD 05/07/2021 8:23 AM

## 2021-05-07 ENCOUNTER — Encounter: Payer: Self-pay | Admitting: Internal Medicine

## 2021-05-07 DIAGNOSIS — J301 Allergic rhinitis due to pollen: Secondary | ICD-10-CM | POA: Diagnosis not present

## 2021-05-13 ENCOUNTER — Inpatient Hospital Stay: Payer: No Typology Code available for payment source | Attending: Internal Medicine

## 2021-05-13 ENCOUNTER — Inpatient Hospital Stay: Payer: No Typology Code available for payment source

## 2021-05-13 VITALS — BP 122/79 | HR 81 | Temp 96.0°F | Resp 16 | Wt 190.2 lb

## 2021-05-13 DIAGNOSIS — L819 Disorder of pigmentation, unspecified: Secondary | ICD-10-CM | POA: Diagnosis not present

## 2021-05-13 DIAGNOSIS — C50512 Malignant neoplasm of lower-outer quadrant of left female breast: Secondary | ICD-10-CM | POA: Diagnosis not present

## 2021-05-13 DIAGNOSIS — J45909 Unspecified asthma, uncomplicated: Secondary | ICD-10-CM | POA: Insufficient documentation

## 2021-05-13 DIAGNOSIS — I10 Essential (primary) hypertension: Secondary | ICD-10-CM | POA: Diagnosis not present

## 2021-05-13 DIAGNOSIS — Z5112 Encounter for antineoplastic immunotherapy: Secondary | ICD-10-CM | POA: Diagnosis not present

## 2021-05-13 DIAGNOSIS — R5383 Other fatigue: Secondary | ICD-10-CM | POA: Diagnosis not present

## 2021-05-13 DIAGNOSIS — Z170 Estrogen receptor positive status [ER+]: Secondary | ICD-10-CM | POA: Insufficient documentation

## 2021-05-13 DIAGNOSIS — Z5111 Encounter for antineoplastic chemotherapy: Secondary | ICD-10-CM | POA: Diagnosis not present

## 2021-05-13 LAB — CBC WITH DIFFERENTIAL/PLATELET
Abs Immature Granulocytes: 0.35 10*3/uL — ABNORMAL HIGH (ref 0.00–0.07)
Basophils Absolute: 0.1 10*3/uL (ref 0.0–0.1)
Basophils Relative: 1 %
Eosinophils Absolute: 0.1 10*3/uL (ref 0.0–0.5)
Eosinophils Relative: 1 %
HCT: 34.2 % — ABNORMAL LOW (ref 36.0–46.0)
Hemoglobin: 11.1 g/dL — ABNORMAL LOW (ref 12.0–15.0)
Immature Granulocytes: 7 %
Lymphocytes Relative: 28 %
Lymphs Abs: 1.4 10*3/uL (ref 0.7–4.0)
MCH: 27.3 pg (ref 26.0–34.0)
MCHC: 32.5 g/dL (ref 30.0–36.0)
MCV: 84 fL (ref 80.0–100.0)
Monocytes Absolute: 0.5 10*3/uL (ref 0.1–1.0)
Monocytes Relative: 9 %
Neutro Abs: 2.8 10*3/uL (ref 1.7–7.7)
Neutrophils Relative %: 54 %
Platelets: 283 10*3/uL (ref 150–400)
RBC: 4.07 MIL/uL (ref 3.87–5.11)
RDW: 17.4 % — ABNORMAL HIGH (ref 11.5–15.5)
Smear Review: NORMAL
WBC: 5.2 10*3/uL (ref 4.0–10.5)
nRBC: 0 % (ref 0.0–0.2)

## 2021-05-13 LAB — COMPREHENSIVE METABOLIC PANEL
ALT: 53 U/L — ABNORMAL HIGH (ref 0–44)
AST: 28 U/L (ref 15–41)
Albumin: 3.9 g/dL (ref 3.5–5.0)
Alkaline Phosphatase: 68 U/L (ref 38–126)
Anion gap: 8 (ref 5–15)
BUN: 17 mg/dL (ref 6–20)
CO2: 24 mmol/L (ref 22–32)
Calcium: 8.9 mg/dL (ref 8.9–10.3)
Chloride: 106 mmol/L (ref 98–111)
Creatinine, Ser: 0.95 mg/dL (ref 0.44–1.00)
GFR, Estimated: >60 mL/min (ref >60)
Glucose, Bld: 104 mg/dL — ABNORMAL HIGH (ref 70–99)
Potassium: 4.3 mmol/L (ref 3.5–5.1)
Sodium: 138 mmol/L (ref 135–145)
Total Bilirubin: 0.7 mg/dL (ref 0.3–1.2)
Total Protein: 6.4 g/dL — ABNORMAL LOW (ref 6.5–8.1)

## 2021-05-13 MED ORDER — PACLITAXEL CHEMO INJECTION 300 MG/50ML
80.0000 mg/m2 | Freq: Once | INTRAVENOUS | Status: AC
  Administered 2021-05-13: 162 mg via INTRAVENOUS
  Filled 2021-05-13: qty 27

## 2021-05-13 MED ORDER — TRASTUZUMAB-ANNS CHEMO 150 MG IV SOLR
2.0000 mg/kg | Freq: Once | INTRAVENOUS | Status: AC
  Administered 2021-05-13: 168 mg via INTRAVENOUS
  Filled 2021-05-13: qty 8

## 2021-05-13 MED ORDER — SODIUM CHLORIDE 0.9 % IV SOLN
Freq: Once | INTRAVENOUS | Status: AC
  Filled 2021-05-13: qty 250

## 2021-05-13 MED ORDER — DIPHENHYDRAMINE HCL 50 MG/ML IJ SOLN
50.0000 mg | Freq: Once | INTRAMUSCULAR | Status: AC
  Administered 2021-05-13: 50 mg via INTRAVENOUS
  Filled 2021-05-13: qty 1

## 2021-05-13 MED ORDER — SODIUM CHLORIDE 0.9 % IV SOLN
20.0000 mg | Freq: Once | INTRAVENOUS | Status: AC
  Administered 2021-05-13: 20 mg via INTRAVENOUS
  Filled 2021-05-13: qty 20

## 2021-05-13 MED ORDER — FAMOTIDINE 20 MG IN NS 100 ML IVPB
20.0000 mg | Freq: Once | INTRAVENOUS | Status: AC
  Administered 2021-05-13: 20 mg via INTRAVENOUS
  Filled 2021-05-13: qty 20

## 2021-05-13 MED ORDER — ACETAMINOPHEN 325 MG PO TABS
650.0000 mg | ORAL_TABLET | Freq: Once | ORAL | Status: AC
  Administered 2021-05-13: 650 mg via ORAL
  Filled 2021-05-13: qty 2

## 2021-05-13 MED ORDER — HEPARIN SOD (PORK) LOCK FLUSH 100 UNIT/ML IV SOLN
INTRAVENOUS | Status: AC
  Filled 2021-05-13: qty 5

## 2021-05-13 MED ORDER — HEPARIN SOD (PORK) LOCK FLUSH 100 UNIT/ML IV SOLN
500.0000 [IU] | Freq: Once | INTRAVENOUS | Status: AC | PRN
  Administered 2021-05-13: 500 [IU]
  Filled 2021-05-13: qty 5

## 2021-05-13 NOTE — Patient Instructions (Signed)
[facility]  Discharge Instructions: Thank you for choosing [facility] to provide your oncology and hematology care.  If you have a lab appointment with the [facility], please go directly to the [facility] and check in at the registration area.  Wear comfortable clothing and clothing appropriate for easy access to any Portacath or PICC line.   We strive to give you quality time with your provider. You may need to reschedule your appointment if you arrive late (15 or more minutes).  Arriving late affects you and other patients whose appointments are after yours.  Also, if you miss three or more appointments without notifying the office, you may be dismissed from the clinic at the provider's discretion.      For prescription refill requests, have your pharmacy contact our office and allow 72 hours for refills to be completed.    Today you received the following chemotherapy and/or immunotherapy agents Kanjinti & Taxol      To help prevent nausea and vomiting after your treatment, we encourage you to take your nausea medication as directed.  BELOW ARE SYMPTOMS THAT SHOULD BE REPORTED IMMEDIATELY: *FEVER GREATER THAN 100.4 F (38 C) OR HIGHER *CHILLS OR SWEATING *NAUSEA AND VOMITING THAT IS NOT CONTROLLED WITH YOUR NAUSEA MEDICATION *UNUSUAL SHORTNESS OF BREATH *UNUSUAL BRUISING OR BLEEDING *URINARY PROBLEMS (pain or burning when urinating, or frequent urination) *BOWEL PROBLEMS (unusual diarrhea, constipation, pain near the anus) TENDERNESS IN MOUTH AND THROAT WITH OR WITHOUT PRESENCE OF ULCERS (sore throat, sores in mouth, or a toothache) UNUSUAL RASH, SWELLING OR PAIN  UNUSUAL VAGINAL DISCHARGE OR ITCHING   Items with * indicate a potential emergency and should be followed up as soon as possible or go to the Emergency Department if any problems should occur.  Please show the CHEMOTHERAPY ALERT CARD or IMMUNOTHERAPY ALERT CARD at  check-in to the Emergency Department and triage nurse.  Should you have questions after your visit or need to cancel or reschedule your appointment, please contact [facility]  [phone] and follow the prompts.  Office hours are 8:00 a.m. to 4:30 p.m. Monday - Friday. Please note that voicemails left after 4:00 p.m. may not be returned until the following business day.  We are closed weekends and major holidays. You have access to a nurse at all times for urgent questions. Please call the main number to the clinic [phone] and follow the prompts.  For any non-urgent questions, you may also contact your provider using MyChart. We now offer e-Visits for anyone 50 and older to request care online for non-urgent symptoms. For details visit [email or web address].   Also download the MyChart app! Go to the app store, search "MyChart", open the app, select [facility], and log in with your MyChart username and password.  Due to Covid, a mask is required upon entering the hospital/clinic. If you do not have a mask, one will be given to you upon arrival. For doctor visits, patients may have 1 support person aged 20 or older with them. For treatment visits, patients cannot have anyone with them due to current Covid guidelines and our immunocompromised population.

## 2021-05-15 DIAGNOSIS — J301 Allergic rhinitis due to pollen: Secondary | ICD-10-CM | POA: Diagnosis not present

## 2021-05-16 ENCOUNTER — Other Ambulatory Visit: Payer: Self-pay

## 2021-05-16 ENCOUNTER — Encounter: Payer: Self-pay | Admitting: Plastic Surgery

## 2021-05-16 ENCOUNTER — Ambulatory Visit (INDEPENDENT_AMBULATORY_CARE_PROVIDER_SITE_OTHER): Payer: No Typology Code available for payment source | Admitting: Plastic Surgery

## 2021-05-16 DIAGNOSIS — C50512 Malignant neoplasm of lower-outer quadrant of left female breast: Secondary | ICD-10-CM

## 2021-05-16 DIAGNOSIS — Z170 Estrogen receptor positive status [ER+]: Secondary | ICD-10-CM

## 2021-05-16 NOTE — Progress Notes (Signed)
   Subjective:    Patient ID: Judy Padilla, female    DOB: 1965/12/27, 55 y.o.   MRN: [id number]  The patient is a 55 year old female here for follow-up on her left breast reconstruction.  She should be finished with chemotherapy in the next 2 weeks.  She would like to move towards scheduling her exchange.  She is thinking about doing a mastopexy on the right.  We will save the time for her.  She may change her mind but I think she will like being the same size on each side.  So far she is doing really well and looking forward to completing her chemotherapy.     Review of Systems  Constitutional: Negative.   HENT: Negative.    Eyes: Negative.   Respiratory: Negative.    Cardiovascular: Negative.   Gastrointestinal: Negative.   Endocrine: Negative.   Genitourinary: Negative.   Neurological: Negative.   Hematological: Negative.   Psychiatric/Behavioral: Negative.        Objective:   Physical Exam Vitals and nursing note reviewed.  Constitutional:      Appearance: Normal appearance.  Cardiovascular:     Rate and Rhythm: Normal rate.     Pulses: Normal pulses.  Pulmonary:     Effort: Pulmonary effort is normal.  Musculoskeletal:        General: No swelling or deformity.  Skin:    General: Skin is warm.     Capillary Refill: Capillary refill takes less than 2 seconds.     Coloration: Skin is not jaundiced.     Findings: No bruising.  Neurological:     General: No focal deficit present.     Mental Status: She is alert. Mental status is at baseline.          Assessment & Plan:     ICD-10-CM   1. Malignant neoplasm of lower-outer quadrant of left breast of female, estrogen receptor positive ([facility])  C50.512    Z17.0       We placed injectable saline in the Expander using a sterile technique: Left: 50 cc for a total of 400 / 535 cc Plan for removal of left breast expander and placement of implant.  Likely silicone.  And likely symmetry surgery with mastopexy on the  right side.  Plan for August.

## 2021-05-21 ENCOUNTER — Inpatient Hospital Stay: Payer: No Typology Code available for payment source

## 2021-05-21 ENCOUNTER — Inpatient Hospital Stay (HOSPITAL_BASED_OUTPATIENT_CLINIC_OR_DEPARTMENT_OTHER): Payer: No Typology Code available for payment source | Admitting: Internal Medicine

## 2021-05-21 ENCOUNTER — Other Ambulatory Visit: Payer: Self-pay

## 2021-05-21 DIAGNOSIS — R5383 Other fatigue: Secondary | ICD-10-CM | POA: Diagnosis not present

## 2021-05-21 DIAGNOSIS — Z170 Estrogen receptor positive status [ER+]: Secondary | ICD-10-CM

## 2021-05-21 DIAGNOSIS — C50512 Malignant neoplasm of lower-outer quadrant of left female breast: Secondary | ICD-10-CM | POA: Diagnosis not present

## 2021-05-21 DIAGNOSIS — L819 Disorder of pigmentation, unspecified: Secondary | ICD-10-CM | POA: Diagnosis not present

## 2021-05-21 DIAGNOSIS — J45909 Unspecified asthma, uncomplicated: Secondary | ICD-10-CM | POA: Diagnosis not present

## 2021-05-21 DIAGNOSIS — Z5111 Encounter for antineoplastic chemotherapy: Secondary | ICD-10-CM | POA: Diagnosis not present

## 2021-05-21 DIAGNOSIS — I10 Essential (primary) hypertension: Secondary | ICD-10-CM | POA: Diagnosis not present

## 2021-05-21 DIAGNOSIS — Z5112 Encounter for antineoplastic immunotherapy: Secondary | ICD-10-CM | POA: Diagnosis not present

## 2021-05-21 LAB — CBC WITH DIFFERENTIAL/PLATELET
Abs Immature Granulocytes: 0.64 10*3/uL — ABNORMAL HIGH (ref 0.00–0.07)
Basophils Absolute: 0.1 10*3/uL (ref 0.0–0.1)
Basophils Relative: 1 %
Eosinophils Absolute: 0.1 10*3/uL (ref 0.0–0.5)
Eosinophils Relative: 1 %
HCT: 33.8 % — ABNORMAL LOW (ref 36.0–46.0)
Hemoglobin: 11.1 g/dL — ABNORMAL LOW (ref 12.0–15.0)
Immature Granulocytes: 10 %
Lymphocytes Relative: 25 %
Lymphs Abs: 1.7 10*3/uL (ref 0.7–4.0)
MCH: 27.5 pg (ref 26.0–34.0)
MCHC: 32.8 g/dL (ref 30.0–36.0)
MCV: 83.7 fL (ref 80.0–100.0)
Monocytes Absolute: 0.7 10*3/uL (ref 0.1–1.0)
Monocytes Relative: 10 %
Neutro Abs: 3.5 10*3/uL (ref 1.7–7.7)
Neutrophils Relative %: 53 %
Platelets: 273 10*3/uL (ref 150–400)
RBC: 4.04 MIL/uL (ref 3.87–5.11)
RDW: 17.5 % — ABNORMAL HIGH (ref 11.5–15.5)
Smear Review: ADEQUATE
WBC: 6.6 10*3/uL (ref 4.0–10.5)
nRBC: 0.6 % — ABNORMAL HIGH (ref 0.0–0.2)

## 2021-05-21 LAB — COMPREHENSIVE METABOLIC PANEL
ALT: 53 U/L — ABNORMAL HIGH (ref 0–44)
AST: 29 U/L (ref 15–41)
Albumin: 3.9 g/dL (ref 3.5–5.0)
Alkaline Phosphatase: 66 U/L (ref 38–126)
Anion gap: 7 (ref 5–15)
BUN: 17 mg/dL (ref 6–20)
CO2: 24 mmol/L (ref 22–32)
Calcium: 8.9 mg/dL (ref 8.9–10.3)
Chloride: 106 mmol/L (ref 98–111)
Creatinine, Ser: 0.92 mg/dL (ref 0.44–1.00)
GFR, Estimated: >60 mL/min (ref >60)
Glucose, Bld: 97 mg/dL (ref 70–99)
Potassium: 4.2 mmol/L (ref 3.5–5.1)
Sodium: 137 mmol/L (ref 135–145)
Total Bilirubin: 0.4 mg/dL (ref 0.3–1.2)
Total Protein: 6.4 g/dL — ABNORMAL LOW (ref 6.5–8.1)

## 2021-05-21 MED ORDER — SODIUM CHLORIDE 0.9 % IV SOLN
Freq: Once | INTRAVENOUS | Status: AC
  Filled 2021-05-21: qty 250

## 2021-05-21 MED ORDER — SODIUM CHLORIDE 0.9 % IV SOLN
20.0000 mg | Freq: Once | INTRAVENOUS | Status: AC
  Administered 2021-05-21: 20 mg via INTRAVENOUS
  Filled 2021-05-21: qty 20

## 2021-05-21 MED ORDER — HEPARIN SOD (PORK) LOCK FLUSH 100 UNIT/ML IV SOLN
500.0000 [IU] | Freq: Once | INTRAVENOUS | Status: DC
  Filled 2021-05-21: qty 5

## 2021-05-21 MED ORDER — SODIUM CHLORIDE 0.9% FLUSH
10.0000 mL | INTRAVENOUS | Status: DC | PRN
  Administered 2021-05-21: 10 mL via INTRAVENOUS
  Filled 2021-05-21: qty 10

## 2021-05-21 MED ORDER — FAMOTIDINE 20 MG IN NS 100 ML IVPB
20.0000 mg | Freq: Once | INTRAVENOUS | Status: AC
  Administered 2021-05-21: 20 mg via INTRAVENOUS
  Filled 2021-05-21: qty 20

## 2021-05-21 MED ORDER — SODIUM CHLORIDE 0.9 % IV SOLN
80.0000 mg/m2 | Freq: Once | INTRAVENOUS | Status: AC
  Administered 2021-05-21: 162 mg via INTRAVENOUS
  Filled 2021-05-21: qty 27

## 2021-05-21 MED ORDER — HEPARIN SOD (PORK) LOCK FLUSH 100 UNIT/ML IV SOLN
500.0000 [IU] | Freq: Once | INTRAVENOUS | Status: AC | PRN
  Administered 2021-05-21: 500 [IU]
  Filled 2021-05-21: qty 5

## 2021-05-21 MED ORDER — TRASTUZUMAB-ANNS CHEMO 150 MG IV SOLR
2.0000 mg/kg | Freq: Once | INTRAVENOUS | Status: AC
  Administered 2021-05-21: 168 mg via INTRAVENOUS
  Filled 2021-05-21: qty 8

## 2021-05-21 MED ORDER — HEPARIN SOD (PORK) LOCK FLUSH 100 UNIT/ML IV SOLN
INTRAVENOUS | Status: AC
  Filled 2021-05-21: qty 5

## 2021-05-21 MED ORDER — DIPHENHYDRAMINE HCL 50 MG/ML IJ SOLN
50.0000 mg | Freq: Once | INTRAMUSCULAR | Status: AC
  Administered 2021-05-21: 50 mg via INTRAVENOUS
  Filled 2021-05-21: qty 1

## 2021-05-21 MED ORDER — ACETAMINOPHEN 325 MG PO TABS
650.0000 mg | ORAL_TABLET | Freq: Once | ORAL | Status: AC
Start: 2021-05-21 — End: 2021-05-21
  Administered 2021-05-21: 650 mg via ORAL
  Filled 2021-05-21: qty 2

## 2021-05-21 NOTE — Progress Notes (Signed)
[facility] [place] NOTE  Patient Care Team: [name], MD as PCP - General (Internal Medicine) [name], RN as Registered Nurse  CHIEF COMPLAINTS/PURPOSE OF CONSULTATION: Breast cancer    Oncology History Overview Note  # April 2022-stage Ia mammary carcinoma ER/PR positive HER2 positive [TRIPLE positive]; negative margins s/p simple mastectomy with plan for immediate reconstruction [Dr.Rodenberg/[name]]; NO RT  # May 2nd, 2022- Taxol-Herceptin  # MUGA scan- 64% [03/06/2021]-   # LMP- mid 2020; Uterine ablation- [phone]; # HTN; Asthma- well controlled [on allergy shots];Marland Kitchen    Malignant neoplasm of lower-outer quadrant of left breast of female, estrogen receptor positive ([place])  12/25/2020 Initial Diagnosis   Malignant neoplasm of lower-outer quadrant of left breast of female, estrogen receptor positive ([place])    02/26/2021 Cancer Staging   Staging form: Breast, AJCC 8th Edition - Pathologic: Stage IA (pT1c, pN0, cM0, G3, ER+, PR+, HER2+) - Signed by Cammie Sickle, MD on 02/26/2021  Mitotic count score: Score 3  Histologic grading system: 3 grade system    03/10/2021 -  Chemotherapy    Patient is on Treatment Plan: BREAST PACLITAXEL + TRASTUZUMAB Q7D / TRASTUZUMAB Q21D        Genetic Testing   Negative genetic testing. No pathogenic variants identified on the [facility] Multi-Cancer+RNA panel. 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 The report date is 04/29/2021.  The Multi-Cancer Panel + RNA offered by [facility] includes sequencing and/or deletion duplication testing of the following 84 genes: AIP, ALK, APC, ATM, AXIN2,BAP1,  BARD1, BLM, BMPR1A, BRCA1, BRCA2, BRIP1, CASR, CDC73, CDH1, CDK4, CDKN1B, CDKN1C, CDKN2A (p14ARF), CDKN2A (p16INK4a), CEBPA, CHEK2, CTNNA1, DICER1, DIS3L2, EGFR (c.2369C>T, p.Thr790Met variant only), EPCAM (Deletion/duplication testing only), FH, FLCN, GATA2, GPC3, GREM1 (Promoter region deletion/duplication testing only),  HOXB13 (c.251G>A, p.Gly84Glu), HRAS, KIT, MAX, MEN1, MET, MITF (c.952G>A, p.Glu318Lys variant only), MLH1, MSH2, MSH3, MSH6, MUTYH, NBN, NF1, NF2, NTHL1, PALB2, PDGFRA, PHOX2B, PMS2, POLD1, POLE, POT1, PRKAR1A, PTCH1, PTEN, RAD50, RAD51C, RAD51D, RB1, RECQL4, RET, RUNX1, SDHAF2, SDHA (sequence changes only), SDHB, SDHC, SDHD, SMAD4, SMARCA4, SMARCB1, SMARCE1, STK11, SUFU, TERC, TERT, TMEM127, TP53, TSC1, TSC2, VHL, WRN and WT1.      HISTORY OF PRESENTING ILLNESS:  Judy Padilla 55 y.o.  female newly diagnosed breast cancer stage I ER/PR positive HER2/neu positive on Taxol Herceptin adjuvant chemotherapy is here for follow-up.  Patient complains of worsening fatigue.  Otherwise denies any new shortness of breath or cough.  Denies any fevers or chills.  No tingling or numbness.  Complains hyperpigmentation of her surface of the hands.  No itching.  Review of Systems  Constitutional:  Negative for chills, diaphoresis, fever, malaise/fatigue and weight loss.  HENT:  Negative for nosebleeds and sore throat.   Eyes:  Negative for double vision.  Respiratory:  Negative for cough, hemoptysis, sputum production, shortness of breath and wheezing.   Cardiovascular:  Negative for chest pain, palpitations, orthopnea and leg swelling.  Gastrointestinal:  Negative for abdominal pain, blood in stool, constipation, diarrhea, heartburn, melena, nausea and vomiting.  Genitourinary:  Negative for dysuria, frequency and urgency.  Musculoskeletal:  Negative for back pain and joint pain.  Skin: Negative.  Negative for itching and rash.  Neurological:  Negative for dizziness, tingling, focal weakness, weakness and headaches.  Endo/Heme/Allergies:  Does not bruise/bleed easily.  Psychiatric/Behavioral:  Negative for depression. The patient is not nervous/anxious and does not have insomnia.     MEDICAL HISTORY:  Past Medical History:  Diagnosis Date   Asthma  well controlled   Breast cancer ([place])    Family  history of adverse reaction to anesthesia    sister-hard time waking up   Family history of breast cancer    Family history of prostate cancer    Family history of uterine cancer    GERD (gastroesophageal reflux disease)    Hypertension     SURGICAL HISTORY: Past Surgical History:  Procedure Laterality Date   ABLATION     BREAST BIOPSY Left 2011   Benign per pt   BREAST BIOPSY Left 12/12/2020   3:30 3 cmfn, Q marker, path pending   BREAST BIOPSY Left 12/12/2020   3:30 1 cmfn, Vision marker, path pending   BREAST RECONSTRUCTION WITH PLACEMENT OF TISSUE EXPANDER AND FLEX HD (ACELLULAR HYDRATED DERMIS) Left 02/10/2021   Procedure: IMMEDIATE LEFT BREAST RECONSTRUCTION WITH PLACEMENT OF TISSUE EXPANDER AND FLEX HD (ACELLULAR HYDRATED DERMIS);  Surgeon: Wallace Going, DO;  Location: [facility];  Service: Plastics;  Laterality: Left;   COLONOSCOPY  01/15/2020   PORTACATH PLACEMENT Right 02/10/2021   Procedure: INSERTION PORT-A-CATH;  Surgeon: Ronny Bacon, MD;  Location: [facility];  Service: General;  Laterality: Right;   SIMPLE MASTECTOMY WITH AXILLARY SENTINEL NODE BIOPSY Left 02/10/2021   Procedure: SIMPLE MASTECTOMY WITH AXILLARY SENTINEL NODE BIOPSY;  Surgeon: Ronny Bacon, MD;  Location: [facility];  Service: General;  Laterality: Left;    SOCIAL HISTORY: Social History   Socioeconomic History   Marital status: Married    Spouse name: Not on file   Number of children: Not on file   Years of education: Not on file   Highest education level: Not on file  Occupational History   Not on file  Tobacco Use   Smoking status: Never   Smokeless tobacco: Never  Vaping Use   Vaping Use: Never used  Substance and Sexual Activity   Alcohol use: Not Currently   Drug use: Never   Sexual activity: Not on file  Other Topics Concern   Not on file  Social History Narrative   Lives in [place] with husband; kids- college. Works for [employer]- working from home. No  smoking or alcohol.    Social Determinants of Health   Financial Resource Strain: Not on file  Food Insecurity: Not on file  Transportation Needs: Not on file  Physical Activity: Not on file  Stress: Not on file  Social Connections: Not on file  Intimate Partner Violence: Not on file    FAMILY HISTORY: Family History  Problem Relation Age of Onset   Hypertension Mother    Diabetes Mother    Hypertension Father    Diabetes Father    Cancer Father        prostate cancer-70s   Cancer Maternal Grandmother        uterine cancer   Breast cancer Sister        in in 76s.     ALLERGIES:  is allergic to shrimp extract allergy skin test and other.  MEDICATIONS:  Current Outpatient Medications  Medication Sig Dispense Refill   albuterol (VENTOLIN HFA) 108 (90 Base) MCG/ACT inhaler Inhale 1 puff into the lungs every 6 (six) hours as needed for shortness of breath.     amLODipine (NORVASC) 10 MG tablet Take 10 mg by mouth every morning.     budesonide (PULMICORT) 0.5 MG/2ML nebulizer solution Take 2 mLs by nebulization daily.     cetirizine (ZYRTEC) 10 MG tablet Take 10 mg by mouth daily as needed for  allergies.     Cholecalciferol 1.25 MG (50000 UT) capsule Take 50,000 Units by mouth once a week.     fluticasone (FLONASE) 50 MCG/ACT nasal spray Place 1 spray into both nostrils daily as needed for allergies.     fluticasone furoate-vilanterol (BREO ELLIPTA) 100-25 MCG/INH AEPB Inhale 1 puff into the lungs as needed for shortness of breath.     GNP BUDESONIDE NASAL SPRAY NA Place 1 spray into the nose daily.     lidocaine-prilocaine (EMLA) cream Apply 30 -45 mins prior to port access. 30 g 0   Multiple Vitamins-Minerals (CENTRUM ADULTS) TABS Take 1 tablet by mouth daily.     naproxen sodium (ALEVE) 220 MG tablet Take 220 mg by mouth daily as needed (knee pain).     NON FORMULARY Takes weekly allergy shots     pantoprazole (PROTONIX) 40 MG tablet Take 40 mg by mouth every morning.      rosuvastatin (CRESTOR) 40 MG tablet Take 40 mg by mouth daily.     diazepam (VALIUM) 2 MG tablet Take 1 tablet (2 mg total) by mouth every 12 (twelve) hours as needed for muscle spasms. (Patient not taking: Reported on 05/21/2021) 20 tablet 0   EPINEPHrine 0.3 mg/0.3 mL IJ SOAJ injection Inject 0.3 mg into the muscle as needed for anaphylaxis. (Patient not taking: Reported on 05/21/2021)     ondansetron (ZOFRAN) 4 MG tablet Take 1 tablet (4 mg total) by mouth every 8 (eight) hours as needed for nausea or vomiting. (Patient not taking: Reported on 05/21/2021) 20 tablet 0   ondansetron (ZOFRAN) 8 MG tablet One pill every 8 hours as needed for nausea/vomitting. (Patient not taking: Reported on 05/21/2021) 40 tablet 1   prochlorperazine (COMPAZINE) 10 MG tablet Take 1 tablet (10 mg total) by mouth every 6 (six) hours as needed for nausea or vomiting. (Patient not taking: Reported on 05/21/2021) 40 tablet 1   No current facility-administered medications for this visit.      Marland Kitchen  PHYSICAL EXAMINATION: ECOG PERFORMANCE STATUS: 0 - Asymptomatic  Vitals:   05/21/21 0849  BP: 131/87  Pulse: 85  Resp: 18  Temp: 98 F (36.7 C)  SpO2: 100%   Filed Weights   05/21/21 0849  Weight: 192 lb (87.1 kg)    Physical Exam Constitutional:      Comments: Accompanied by husband.  Ambulating independently.  HENT:     Head: Normocephalic and atraumatic.     Mouth/Throat:     Pharynx: No oropharyngeal exudate.  Eyes:     Pupils: Pupils are equal, round, and reactive to light.  Cardiovascular:     Rate and Rhythm: Normal rate and regular rhythm.  Pulmonary:     Effort: Pulmonary effort is normal. No respiratory distress.     Breath sounds: Normal breath sounds. No wheezing.  Abdominal:     General: Bowel sounds are normal. There is no distension.     Palpations: Abdomen is soft. There is no mass.     Tenderness: There is no abdominal tenderness. There is no guarding or rebound.  Musculoskeletal:         General: No tenderness. Normal range of motion.     Cervical back: Normal range of motion and neck supple.  Skin:    General: Skin is warm.  Neurological:     Mental Status: She is alert and oriented to person, place, and time.  Psychiatric:        Mood and Affect: Affect normal.  LABORATORY DATA:  I have reviewed the data as listed Lab Results  Component Value Date   WBC 6.6 05/21/2021   HGB 11.1 (L) 05/21/2021   HCT 33.8 (L) 05/21/2021   MCV 83.7 05/21/2021   PLT 273 05/21/2021   Recent Labs    05/06/21 0821 05/13/21 0836 05/21/21 0801  NA 138 138 137  K 3.9 4.3 4.2  CL 108 106 106  CO2 _0 GLUCOSE 103* 104* 97  BUN _1 CREATININE 0.82 0.95 0.92  CALCIUM 9.1 8.9 8.9  GFRNONAA >60 >60 >60  PROT 6.3* 6.4* 6.4*  ALBUMIN 3.8 3.9 3.9  AST _2 ALT 49* 53* 53*  ALKPHOS 61 68 66  BILITOT 0.6 0.7 0.4    RADIOGRAPHIC STUDIES: I have personally reviewed the radiological images as listed and agreed with the findings in the report. No results found.  ASSESSMENT & PLAN:   Malignant neoplasm of lower-outer quadrant of left breast of female, estrogen receptor positive ([place]) # Left breast- CA- s/p mastectomy-  pT1c pN0 [Stage IA]Grade 3.  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  On adjuvant Taxol Herceptin.  #Proceed with cycle #3; d-15 today Taxol-Herceprin Labs today reviewed;  acceptable for treatment today. We will plan MUGA scan every 3 to 4 months/order at next visit.  #Fatigue: Likely from the chemotherapy; grade 1.  Continue increased physical activity.  # Genetics: Reviewed genetics results s/p genetics: NEGATIVE; VUS*   # DISPOSITION: # Proceed with Taxol-herceptin today # 1 week- cbc/bmp-Taxol-Herceptin #  In 2 week-MD; labs- cbc/cmp; Herceptin ONLY.- Dr.B  All questions were answered. The patient/family knows to call the clinic with any problems, questions or concerns.    Cammie Sickle, MD 05/22/2021 10:28 PM

## 2021-05-21 NOTE — Patient Instructions (Signed)
[facility]  Discharge Instructions: Thank you for choosing [facility] to provide your oncology and hematology care.  If you have a lab appointment with the [facility], please go directly to the [facility] and check in at the registration area.  Wear comfortable clothing and clothing appropriate for easy access to any Portacath or PICC line.   We strive to give you quality time with your provider. You may need to reschedule your appointment if you arrive late (15 or more minutes).  Arriving late affects you and other patients whose appointments are after yours.  Also, if you miss three or more appointments without notifying the office, you may be dismissed from the clinic at the provider's discretion.      For prescription refill requests, have your pharmacy contact our office and allow 72 hours for refills to be completed.    Today you received the following chemotherapy and/or immunotherapy agents:Kanjinti and Paclitaxel injection What is this medication? PACLITAXEL (PAK li TAX el) is a chemotherapy drug. It targets fast dividing cells, like cancer cells, and causes these cells to die. This medicine is used to treat ovarian cancer, breast cancer, lung cancer, Kaposi's sarcoma, andother cancers. This medicine may be used for other purposes; ask your health care provider orpharmacist if you have questions. COMMON BRAND NAME(S): Onxol, Taxol What should I tell my care team before I take this medication? They need to know if you have any of these conditions: history of irregular heartbeat liver disease low blood counts, like low white cell, platelet, or red cell counts lung or breathing disease, like asthma tingling of the fingers or toes, or other nerve disorder an unusual or allergic reaction to paclitaxel, alcohol, polyoxyethylated castor oil, other chemotherapy, other medicines, foods, dyes, or preservatives pregnant or trying to get  pregnant breast-feeding How should I use this medication? This drug is given as an infusion into a vein. It is administered in a hospitalor clinic by a specially trained health care professional. Talk to your pediatrician regarding the use of this medicine in children.Special care may be needed. Overdosage: If you think you have taken too much of this medicine contact apoison control center or emergency room at once. NOTE: This medicine is only for you. Do not share this medicine with others. What if I miss a dose? It is important not to miss your dose. Call your doctor or health careprofessional if you are unable to keep an appointment. What may interact with this medication? Do not take this medicine with any of the following medications: live virus vaccines This medicine may also interact with the following medications: antiviral medicines for hepatitis, HIV or AIDS certain antibiotics like erythromycin and clarithromycin certain medicines for fungal infections like ketoconazole and itraconazole certain medicines for seizures like carbamazepine, phenobarbital, phenytoin gemfibrozil nefazodone rifampin St. John's wort This list may not describe all possible interactions. Give your health care provider a list of all the medicines, herbs, non-prescription drugs, or dietary supplements you use. Also tell them if you smoke, drink alcohol, or use illegaldrugs. Some items may interact with your medicine. What should I watch for while using this medication? Your condition will be monitored carefully while you are receiving this medicine. You will need important blood work done while you are taking thismedicine. This medicine can cause serious allergic reactions. To reduce your risk you will need to take other medicine(s) before treatment with this medicine. If you experience allergic reactions like skin  rash, itching or hives, swelling of theface, lips, or tongue, tell your doctor or health care  professional right away. In some cases, you may be given additional medicines to help with side effects.Follow all directions for their use. This drug may make you feel generally unwell. This is not uncommon, as chemotherapy can affect healthy cells as well as cancer cells. Report any side effects. Continue your course of treatment even though you feel ill unless yourdoctor tells you to stop. Call your doctor or health care professional for advice if you get a fever, chills or sore throat, or other symptoms of a cold or flu. Do not treat yourself. This drug decreases your body's ability to fight infections. Try toavoid being around people who are sick. This medicine may increase your risk to bruise or bleed. Call your doctor orhealth care professional if you notice any unusual bleeding. Be careful brushing and flossing your teeth or using a toothpick because you may get an infection or bleed more easily. If you have any dental work done,tell your dentist you are receiving this medicine. Avoid taking products that contain aspirin, acetaminophen, ibuprofen, naproxen, or ketoprofen unless instructed by your doctor. These medicines may hide afever. Do not become pregnant while taking this medicine. Women should inform their doctor if they wish to become pregnant or think they might be pregnant. There is a potential for serious side effects to an unborn child. Talk to your health care professional or pharmacist for more information. Do not breast-feed aninfant while taking this medicine. Men are advised not to father a child while receiving this medicine. This product may contain alcohol. Ask your pharmacist or healthcare provider if this medicine contains alcohol. Be sure to tell all healthcare providers you are taking this medicine. Certain medicines, like metronidazole and disulfiram, can cause an unpleasant reaction when taken with alcohol. The reaction includes flushing, headache, nausea, vomiting,  sweating, and increased thirst. Thereaction can last from 30 minutes to several hours. What side effects may I notice from receiving this medication? Side effects that you should report to your doctor or health care professionalas soon as possible: allergic reactions like skin rash, itching or hives, swelling of the face, lips, or tongue breathing problems changes in vision fast, irregular heartbeat high or low blood pressure mouth sores pain, tingling, numbness in the hands or feet signs of decreased platelets or bleeding - bruising, pinpoint red spots on the skin, black, tarry stools, blood in the urine signs of decreased red blood cells - unusually weak or tired, feeling faint or lightheaded, falls signs of infection - fever or chills, cough, sore throat, pain or difficulty passing urine signs and symptoms of liver injury like dark yellow or brown urine; general ill feeling or flu-like symptoms; light-colored stools; loss of appetite; nausea; right upper belly pain; unusually weak or tired; yellowing of the eyes or skin swelling of the ankles, feet, hands unusually slow heartbeat Side effects that usually do not require medical attention (report to yourdoctor or health care professional if they continue or are bothersome): diarrhea hair loss loss of appetite muscle or joint pain nausea, vomiting pain, redness, or irritation at site where injected tiredness This list may not describe all possible side effects. Call your doctor for medical advice about side effects. You may report side effects to FDA at1-800-FDA-1088. Where should I keep my medication? This drug is given in a hospital or clinic and will not be stored at home. NOTE: This sheet is a summary. It  may not cover all possible information. If you have questions about this medicine, talk to your doctor, pharmacist, orhealth care provider.  2022 Elsevier/Gold Standard (2019-09-27 13:37:23) Trastuzumab injection for  infusion What is this medication? TRASTUZUMAB (tras TOO zoo mab) is a monoclonal antibody. It is used to treatbreast cancer and stomach cancer. This medicine may be used for other purposes; ask your health care provider orpharmacist if you have questions. COMMON BRAND NAME(S): Herceptin, Janae Bridgeman, Ontruzant, Trazimera What should I tell my care team before I take this medication? They need to know if you have any of these conditions: heart disease heart failure lung or breathing disease, like asthma an unusual or allergic reaction to trastuzumab, benzyl alcohol, or other medications, foods, dyes, or preservatives pregnant or trying to get pregnant breast-feeding How should I use this medication? This drug is given as an infusion into a vein. It is administered in a hospitalor clinic by a specially trained health care professional. Talk to your pediatrician regarding the use of this medicine in children. Thismedicine is not approved for use in children. Overdosage: If you think you have taken too much of this medicine contact apoison control center or emergency room at once. NOTE: This medicine is only for you. Do not share this medicine with others. What if I miss a dose? It is important not to miss a dose. Call your doctor or health careprofessional if you are unable to keep an appointment. What may interact with this medication? This medicine may interact with the following medications: certain types of chemotherapy, such as daunorubicin, doxorubicin, epirubicin, and idarubicin This list may not describe all possible interactions. Give your health care provider a list of all the medicines, herbs, non-prescription drugs, or dietary supplements you use. Also tell them if you smoke, drink alcohol, or use illegaldrugs. Some items may interact with your medicine. What should I watch for while using this medication? Visit your doctor for checks on your progress. Report any side  effects. Continue your course of treatment even though you feel ill unless your doctortells you to stop. Call your doctor or health care professional for advice if you get a fever, chills or sore throat, or other symptoms of a cold or flu. Do not treatyourself. Try to avoid being around people who are sick. You may experience fever, chills and shaking during your first infusion. These effects are usually mild and can be treated with other medicines. Report any side effects during the infusion to your health care professional. Fever andchills usually do not happen with later infusions. Do not become pregnant while taking this medicine or for 7 months after stopping it. Women should inform their doctor if they wish to become pregnant or think they might be pregnant. Women of child-bearing potential will need to have a negative pregnancy test before starting this medicine. There is a potential for serious side effects to an unborn child. Talk to your health care professional or pharmacist for more information. Do not breast-feed an infantwhile taking this medicine or for 7 months after stopping it. Women must use effective birth control with this medicine. What side effects may I notice from receiving this medication? Side effects that you should report to your doctor or health care professionalas soon as possible: allergic reactions like skin rash, itching or hives, swelling of the face, lips, or tongue chest pain or palpitations cough dizziness feeling faint or lightheaded, falls fever general ill feeling or flu-like symptoms signs of worsening heart failure like  breathing problems; swelling in your legs and feet unusually weak or tired Side effects that usually do not require medical attention (report to yourdoctor or health care professional if they continue or are bothersome): bone pain changes in taste diarrhea joint pain nausea/vomiting weight loss This list may not describe all possible  side effects. Call your doctor for medical advice about side effects. You may report side effects to FDA at1-800-FDA-1088. Where should I keep my medication? This drug is given in a hospital or clinic and will not be stored at home. NOTE: This sheet is a summary. It may not cover all possible information. If you have questions about this medicine, talk to your doctor, pharmacist, orhealth care provider.  2022 Elsevier/Gold Standard (2016-10-20 14:37:52)    To help prevent nausea and vomiting after your treatment, we encourage you to take your nausea medication as directed.  BELOW ARE SYMPTOMS THAT SHOULD BE REPORTED IMMEDIATELY: *FEVER GREATER THAN 100.4 F (38 C) OR HIGHER *CHILLS OR SWEATING *NAUSEA AND VOMITING THAT IS NOT CONTROLLED WITH YOUR NAUSEA MEDICATION *UNUSUAL SHORTNESS OF BREATH *UNUSUAL BRUISING OR BLEEDING *URINARY PROBLEMS (pain or burning when urinating, or frequent urination) *BOWEL PROBLEMS (unusual diarrhea, constipation, pain near the anus) TENDERNESS IN MOUTH AND THROAT WITH OR WITHOUT PRESENCE OF ULCERS (sore throat, sores in mouth, or a toothache) UNUSUAL RASH, SWELLING OR PAIN  UNUSUAL VAGINAL DISCHARGE OR ITCHING   Items with * indicate a potential emergency and should be followed up as soon as possible or go to the Emergency Department if any problems should occur.  Please show the CHEMOTHERAPY ALERT CARD or IMMUNOTHERAPY ALERT CARD at check-in to the Emergency Department and triage nurse.  Should you have questions after your visit or need to cancel or reschedule your appointment, please contact [facility]  [phone] and follow the prompts.  Office hours are 8:00 a.m. to 4:30 p.m. Monday - Friday. Please note that voicemails left after 4:00 p.m. may not be returned until the following business day.  We are closed weekends and major holidays. You have access to a nurse at all times for urgent questions. Please call the  main number to the clinic [phone] and follow the prompts.  For any non-urgent questions, you may also contact your provider using MyChart. We now offer e-Visits for anyone 55 and older to request care online for non-urgent symptoms. For details visit [email or web address].   Also download the MyChart app! Go to the app store, search "MyChart", open the app, select [facility], and log in with your MyChart username and password.  Due to Covid, a mask is required upon entering the hospital/clinic. If you do not have a mask, one will be given to you upon arrival. For doctor visits, patients may have 1 support person aged 5 or older with them. For treatment visits, patients cannot have anyone with them due to current Covid guidelines and our immunocompromised population.

## 2021-05-21 NOTE — Assessment & Plan Note (Addendum)
#  Left breast- CA- s/p mastectomy-  pT1c pN0 [Stage IA]Grade 3.  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  On adjuvant Taxol Herceptin.  #Proceed with cycle #3; d-15 today Taxol-Herceprin Labs today reviewed;  acceptable for treatment today. We will plan MUGA scan every 3 to 4 months/order at next visit.  #Fatigue: Likely from the chemotherapy; grade 1.  Continue increased physical activity.  # Genetics: Reviewed genetics results s/p genetics: NEGATIVE; VUS*   # DISPOSITION: # Proceed with Taxol-herceptin today # 1 week- cbc/bmp-Taxol-Herceptin #  In 2 week-MD; labs- cbc/cmp; Herceptin ONLY.- Dr.B

## 2021-05-22 ENCOUNTER — Encounter: Payer: Self-pay | Admitting: Internal Medicine

## 2021-05-22 DIAGNOSIS — J301 Allergic rhinitis due to pollen: Secondary | ICD-10-CM | POA: Diagnosis not present

## 2021-05-23 DIAGNOSIS — J301 Allergic rhinitis due to pollen: Secondary | ICD-10-CM | POA: Diagnosis not present

## 2021-05-28 ENCOUNTER — Other Ambulatory Visit: Payer: Self-pay

## 2021-05-28 ENCOUNTER — Inpatient Hospital Stay: Payer: No Typology Code available for payment source

## 2021-05-28 VITALS — BP 137/84 | HR 98 | Temp 97.8°F | Resp 16 | Wt 192.4 lb

## 2021-05-28 DIAGNOSIS — L819 Disorder of pigmentation, unspecified: Secondary | ICD-10-CM | POA: Diagnosis not present

## 2021-05-28 DIAGNOSIS — Z5112 Encounter for antineoplastic immunotherapy: Secondary | ICD-10-CM | POA: Diagnosis not present

## 2021-05-28 DIAGNOSIS — C50512 Malignant neoplasm of lower-outer quadrant of left female breast: Secondary | ICD-10-CM

## 2021-05-28 DIAGNOSIS — Z5111 Encounter for antineoplastic chemotherapy: Secondary | ICD-10-CM | POA: Diagnosis not present

## 2021-05-28 DIAGNOSIS — Z170 Estrogen receptor positive status [ER+]: Secondary | ICD-10-CM | POA: Diagnosis not present

## 2021-05-28 DIAGNOSIS — J45909 Unspecified asthma, uncomplicated: Secondary | ICD-10-CM | POA: Diagnosis not present

## 2021-05-28 DIAGNOSIS — I10 Essential (primary) hypertension: Secondary | ICD-10-CM | POA: Diagnosis not present

## 2021-05-28 DIAGNOSIS — R5383 Other fatigue: Secondary | ICD-10-CM | POA: Diagnosis not present

## 2021-05-28 LAB — CBC WITH DIFFERENTIAL/PLATELET
Abs Immature Granulocytes: 0.4 10*3/uL — ABNORMAL HIGH (ref 0.00–0.07)
Basophils Absolute: 0.1 10*3/uL (ref 0.0–0.1)
Basophils Relative: 1 %
Eosinophils Absolute: 0.1 10*3/uL (ref 0.0–0.5)
Eosinophils Relative: 2 %
HCT: 32.3 % — ABNORMAL LOW (ref 36.0–46.0)
Hemoglobin: 10.5 g/dL — ABNORMAL LOW (ref 12.0–15.0)
Immature Granulocytes: 7 %
Lymphocytes Relative: 25 %
Lymphs Abs: 1.4 10*3/uL (ref 0.7–4.0)
MCH: 27.1 pg (ref 26.0–34.0)
MCHC: 32.5 g/dL (ref 30.0–36.0)
MCV: 83.2 fL (ref 80.0–100.0)
Monocytes Absolute: 0.4 10*3/uL (ref 0.1–1.0)
Monocytes Relative: 8 %
Neutro Abs: 3.2 10*3/uL (ref 1.7–7.7)
Neutrophils Relative %: 57 %
Platelets: 255 10*3/uL (ref 150–400)
RBC: 3.88 MIL/uL (ref 3.87–5.11)
RDW: 17.9 % — ABNORMAL HIGH (ref 11.5–15.5)
Smear Review: NORMAL
WBC: 5.7 10*3/uL (ref 4.0–10.5)
nRBC: 0 % (ref 0.0–0.2)

## 2021-05-28 LAB — BASIC METABOLIC PANEL
Anion gap: 8 (ref 5–15)
BUN: 17 mg/dL (ref 6–20)
CO2: 23 mmol/L (ref 22–32)
Calcium: 8.8 mg/dL — ABNORMAL LOW (ref 8.9–10.3)
Chloride: 108 mmol/L (ref 98–111)
Creatinine, Ser: 0.84 mg/dL (ref 0.44–1.00)
GFR, Estimated: >60 mL/min (ref >60)
Glucose, Bld: 112 mg/dL — ABNORMAL HIGH (ref 70–99)
Potassium: 4 mmol/L (ref 3.5–5.1)
Sodium: 139 mmol/L (ref 135–145)

## 2021-05-28 MED ORDER — ACETAMINOPHEN 325 MG PO TABS
650.0000 mg | ORAL_TABLET | Freq: Once | ORAL | Status: AC
  Administered 2021-05-28: 650 mg via ORAL
  Filled 2021-05-28: qty 2

## 2021-05-28 MED ORDER — DIPHENHYDRAMINE HCL 50 MG/ML IJ SOLN
50.0000 mg | Freq: Once | INTRAMUSCULAR | Status: AC
  Administered 2021-05-28: 50 mg via INTRAVENOUS
  Filled 2021-05-28: qty 1

## 2021-05-28 MED ORDER — SODIUM CHLORIDE 0.9 % IV SOLN
Freq: Once | INTRAVENOUS | Status: AC
  Filled 2021-05-28: qty 250

## 2021-05-28 MED ORDER — SODIUM CHLORIDE 0.9% FLUSH
10.0000 mL | Freq: Once | INTRAVENOUS | Status: AC
  Administered 2021-05-28: 10 mL via INTRAVENOUS
  Filled 2021-05-28: qty 10

## 2021-05-28 MED ORDER — HEPARIN SOD (PORK) LOCK FLUSH 100 UNIT/ML IV SOLN
500.0000 [IU] | Freq: Once | INTRAVENOUS | Status: AC
  Administered 2021-05-28: 500 [IU] via INTRAVENOUS
  Filled 2021-05-28: qty 5

## 2021-05-28 MED ORDER — SODIUM CHLORIDE 0.9 % IV SOLN
80.0000 mg/m2 | Freq: Once | INTRAVENOUS | Status: AC
  Administered 2021-05-28: 162 mg via INTRAVENOUS
  Filled 2021-05-28: qty 27

## 2021-05-28 MED ORDER — HEPARIN SOD (PORK) LOCK FLUSH 100 UNIT/ML IV SOLN
500.0000 [IU] | Freq: Once | INTRAVENOUS | Status: DC | PRN
  Filled 2021-05-28: qty 5

## 2021-05-28 MED ORDER — SODIUM CHLORIDE 0.9 % IV SOLN
20.0000 mg | Freq: Once | INTRAVENOUS | Status: AC
  Administered 2021-05-28: 20 mg via INTRAVENOUS
  Filled 2021-05-28: qty 20

## 2021-05-28 MED ORDER — HEPARIN SOD (PORK) LOCK FLUSH 100 UNIT/ML IV SOLN
INTRAVENOUS | Status: AC
  Filled 2021-05-28: qty 5

## 2021-05-28 MED ORDER — FAMOTIDINE 20 MG IN NS 100 ML IVPB
20.0000 mg | Freq: Once | INTRAVENOUS | Status: AC
  Administered 2021-05-28: 20 mg via INTRAVENOUS
  Filled 2021-05-28: qty 100
  Filled 2021-05-28: qty 20

## 2021-05-28 MED ORDER — TRASTUZUMAB-ANNS CHEMO 150 MG IV SOLR
2.0000 mg/kg | Freq: Once | INTRAVENOUS | Status: AC
  Administered 2021-05-28: 168 mg via INTRAVENOUS
  Filled 2021-05-28: qty 8

## 2021-05-28 NOTE — Patient Instructions (Signed)
[facility]  Discharge Instructions: Thank you for choosing [facility] to provide your oncology and hematology care.  If you have a lab appointment with the [facility], please go directly to the [facility] and check in at the registration area.  Wear comfortable clothing and clothing appropriate for easy access to any Portacath or PICC line.   We strive to give you quality time with your provider. You may need to reschedule your appointment if you arrive late (15 or more minutes).  Arriving late affects you and other patients whose appointments are after yours.  Also, if you miss three or more appointments without notifying the office, you may be dismissed from the clinic at the provider's discretion.      For prescription refill requests, have your pharmacy contact our office and allow 72 hours for refills to be completed.    Today you received the following chemotherapy and/or immunotherapy agents Herceptin, Taxol     To help prevent nausea and vomiting after your treatment, we encourage you to take your nausea medication as directed.  BELOW ARE SYMPTOMS THAT SHOULD BE REPORTED IMMEDIATELY: *FEVER GREATER THAN 100.4 F (38 C) OR HIGHER *CHILLS OR SWEATING *NAUSEA AND VOMITING THAT IS NOT CONTROLLED WITH YOUR NAUSEA MEDICATION *UNUSUAL SHORTNESS OF BREATH *UNUSUAL BRUISING OR BLEEDING *URINARY PROBLEMS (pain or burning when urinating, or frequent urination) *BOWEL PROBLEMS (unusual diarrhea, constipation, pain near the anus) TENDERNESS IN MOUTH AND THROAT WITH OR WITHOUT PRESENCE OF ULCERS (sore throat, sores in mouth, or a toothache) UNUSUAL RASH, SWELLING OR PAIN  UNUSUAL VAGINAL DISCHARGE OR ITCHING   Items with * indicate a potential emergency and should be followed up as soon as possible or go to the Emergency Department if any problems should occur.  Please show the CHEMOTHERAPY ALERT CARD or IMMUNOTHERAPY ALERT CARD at  check-in to the Emergency Department and triage nurse.  Should you have questions after your visit or need to cancel or reschedule your appointment, please contact [facility]  [phone] and follow the prompts.  Office hours are 8:00 a.m. to 4:30 p.m. Monday - Friday. Please note that voicemails left after 4:00 p.m. may not be returned until the following business day.  We are closed weekends and major holidays. You have access to a nurse at all times for urgent questions. Please call the main number to the clinic [phone] and follow the prompts.  For any non-urgent questions, you may also contact your provider using MyChart. We now offer e-Visits for anyone 43 and older to request care online for non-urgent symptoms. For details visit [email or web address].   Also download the MyChart app! Go to the app store, search "MyChart", open the app, select [facility], and log in with your MyChart username and password.  Due to Covid, a mask is required upon entering the hospital/clinic. If you do not have a mask, one will be given to you upon arrival. For doctor visits, patients may have 1 support person aged 80 or older with them. For treatment visits, patients cannot have anyone with them due to current Covid guidelines and our immunocompromised population.

## 2021-05-29 DIAGNOSIS — J301 Allergic rhinitis due to pollen: Secondary | ICD-10-CM | POA: Diagnosis not present

## 2021-05-30 ENCOUNTER — Encounter: Payer: Self-pay | Admitting: Plastic Surgery

## 2021-05-30 ENCOUNTER — Other Ambulatory Visit: Payer: Self-pay

## 2021-05-30 ENCOUNTER — Ambulatory Visit (INDEPENDENT_AMBULATORY_CARE_PROVIDER_SITE_OTHER): Payer: No Typology Code available for payment source | Admitting: Plastic Surgery

## 2021-05-30 DIAGNOSIS — Z170 Estrogen receptor positive status [ER+]: Secondary | ICD-10-CM

## 2021-05-30 DIAGNOSIS — C50512 Malignant neoplasm of lower-outer quadrant of left female breast: Secondary | ICD-10-CM

## 2021-05-30 DIAGNOSIS — Z9012 Acquired absence of left breast and nipple: Secondary | ICD-10-CM | POA: Diagnosis not present

## 2021-05-30 DIAGNOSIS — N651 Disproportion of reconstructed breast: Secondary | ICD-10-CM | POA: Insufficient documentation

## 2021-05-30 DIAGNOSIS — Z9010 Acquired absence of unspecified breast and nipple: Secondary | ICD-10-CM | POA: Insufficient documentation

## 2021-05-30 NOTE — Progress Notes (Signed)
   Subjective:    Patient ID: Judy Padilla, female    DOB: [birth year], 55 y.o.   MRN: [id number]  The patient is a 55 year old female here for follow-up on her left breast reconstruction.  She has 400 cc in her expander and does not want to go any larger.  She is happy with the size.  No sign of infection and overall she is doing really well.  She has had some problems with her chemo but that is stable, it caused hyperpigmentation in her hands.  She is ready for her exchange and has OR time at the end of August.  She would like to go with silicone implants.  She is not a smoker.  She is good to be working on a little bit of weight reduction.  And she would like to go ahead and do a right mastopexy.     Review of Systems  Constitutional: Negative.   HENT: Negative.    Eyes: Negative.   Respiratory: Negative.    Cardiovascular: Negative.   Gastrointestinal: Negative.   Endocrine: Negative.   Genitourinary: Negative.   Neurological: Negative.   Hematological: Negative.   Psychiatric/Behavioral: Negative.        Objective:   Physical Exam Vitals and nursing note reviewed.  Constitutional:      Appearance: Normal appearance.  HENT:     Head: Normocephalic and atraumatic.  Cardiovascular:     Rate and Rhythm: Normal rate.     Pulses: Normal pulses.  Pulmonary:     Effort: Pulmonary effort is normal.  Musculoskeletal:        General: No swelling or tenderness. Normal range of motion.  Skin:    General: Skin is warm.     Capillary Refill: Capillary refill takes less than 2 seconds.     Coloration: Skin is not jaundiced.  Neurological:     General: No focal deficit present.     Mental Status: She is alert and oriented to person, place, and time.          Assessment & Plan:     ICD-10-CM   1. Malignant neoplasm of lower-outer quadrant of left breast of female, estrogen receptor positive ([place])  C50.512    Z17.0     2. Acquired absence of left breast  Z90.12     3.  Breast asymmetry following reconstructive surgery  N65.1       Total left expander:  400 / 535 cc.  She would like to go ahead with exchange.  We will plan on removal of left breast expander and placement of silicone implant with right breast mastopexy.  Pictures were obtained of the patient and placed in the chart with the patient's or guardian's permission.

## 2021-06-04 ENCOUNTER — Inpatient Hospital Stay (HOSPITAL_BASED_OUTPATIENT_CLINIC_OR_DEPARTMENT_OTHER): Payer: No Typology Code available for payment source | Admitting: Oncology

## 2021-06-04 ENCOUNTER — Inpatient Hospital Stay: Payer: No Typology Code available for payment source

## 2021-06-04 ENCOUNTER — Inpatient Hospital Stay (HOSPITAL_BASED_OUTPATIENT_CLINIC_OR_DEPARTMENT_OTHER): Payer: No Typology Code available for payment source | Admitting: Internal Medicine

## 2021-06-04 ENCOUNTER — Other Ambulatory Visit: Payer: Self-pay

## 2021-06-04 ENCOUNTER — Other Ambulatory Visit: Payer: Self-pay | Admitting: *Deleted

## 2021-06-04 ENCOUNTER — Encounter: Payer: Self-pay | Admitting: Internal Medicine

## 2021-06-04 VITALS — BP 124/80 | HR 94 | Temp 97.2°F | Resp 16 | Ht 68.0 in | Wt 190.0 lb

## 2021-06-04 DIAGNOSIS — Z5111 Encounter for antineoplastic chemotherapy: Secondary | ICD-10-CM | POA: Diagnosis not present

## 2021-06-04 DIAGNOSIS — C50512 Malignant neoplasm of lower-outer quadrant of left female breast: Secondary | ICD-10-CM

## 2021-06-04 DIAGNOSIS — Z170 Estrogen receptor positive status [ER+]: Secondary | ICD-10-CM

## 2021-06-04 DIAGNOSIS — I10 Essential (primary) hypertension: Secondary | ICD-10-CM | POA: Diagnosis not present

## 2021-06-04 DIAGNOSIS — E876 Hypokalemia: Secondary | ICD-10-CM

## 2021-06-04 DIAGNOSIS — Z09 Encounter for follow-up examination after completed treatment for conditions other than malignant neoplasm: Secondary | ICD-10-CM | POA: Diagnosis not present

## 2021-06-04 DIAGNOSIS — Z5112 Encounter for antineoplastic immunotherapy: Secondary | ICD-10-CM | POA: Diagnosis not present

## 2021-06-04 DIAGNOSIS — R5383 Other fatigue: Secondary | ICD-10-CM | POA: Diagnosis not present

## 2021-06-04 DIAGNOSIS — J45909 Unspecified asthma, uncomplicated: Secondary | ICD-10-CM | POA: Diagnosis not present

## 2021-06-04 DIAGNOSIS — Z95828 Presence of other vascular implants and grafts: Secondary | ICD-10-CM | POA: Diagnosis not present

## 2021-06-04 DIAGNOSIS — L819 Disorder of pigmentation, unspecified: Secondary | ICD-10-CM | POA: Diagnosis not present

## 2021-06-04 LAB — CBC WITH DIFFERENTIAL/PLATELET
Abs Immature Granulocytes: 0.31 10*3/uL — ABNORMAL HIGH (ref 0.00–0.07)
Basophils Absolute: 0.1 10*3/uL (ref 0.0–0.1)
Basophils Relative: 1 %
Eosinophils Absolute: 0.1 10*3/uL (ref 0.0–0.5)
Eosinophils Relative: 2 %
HCT: 32.3 % — ABNORMAL LOW (ref 36.0–46.0)
Hemoglobin: 10.6 g/dL — ABNORMAL LOW (ref 12.0–15.0)
Immature Granulocytes: 6 %
Lymphocytes Relative: 29 %
Lymphs Abs: 1.6 10*3/uL (ref 0.7–4.0)
MCH: 27.7 pg (ref 26.0–34.0)
MCHC: 32.8 g/dL (ref 30.0–36.0)
MCV: 84.6 fL (ref 80.0–100.0)
Monocytes Absolute: 0.4 10*3/uL (ref 0.1–1.0)
Monocytes Relative: 8 %
Neutro Abs: 3.1 10*3/uL (ref 1.7–7.7)
Neutrophils Relative %: 54 %
Platelets: 273 10*3/uL (ref 150–400)
RBC: 3.82 MIL/uL — ABNORMAL LOW (ref 3.87–5.11)
RDW: 18.5 % — ABNORMAL HIGH (ref 11.5–15.5)
WBC: 5.6 10*3/uL (ref 4.0–10.5)
nRBC: 0.4 % — ABNORMAL HIGH (ref 0.0–0.2)

## 2021-06-04 LAB — COMPREHENSIVE METABOLIC PANEL
ALT: 20 U/L (ref 0–44)
AST: 12 U/L — ABNORMAL LOW (ref 15–41)
Albumin: 2 g/dL — ABNORMAL LOW (ref 3.5–5.0)
Alkaline Phosphatase: 31 U/L — ABNORMAL LOW (ref 38–126)
Anion gap: 0 — ABNORMAL LOW (ref 5–15)
BUN: 13 mg/dL (ref 6–20)
CO2: 13 mmol/L — ABNORMAL LOW (ref 22–32)
Calcium: 4.4 mg/dL — CL (ref 8.9–10.3)
Chloride: 129 mmol/L — ABNORMAL HIGH (ref 98–111)
Creatinine, Ser: 0.4 mg/dL — ABNORMAL LOW (ref 0.44–1.00)
GFR, Estimated: >60 mL/min (ref >60)
Glucose, Bld: 67 mg/dL — ABNORMAL LOW (ref 70–99)
Potassium: 2 mmol/L — CL (ref 3.5–5.1)
Sodium: 142 mmol/L (ref 135–145)
Total Bilirubin: 0.3 mg/dL (ref 0.3–1.2)
Total Protein: 3.2 g/dL — ABNORMAL LOW (ref 6.5–8.1)

## 2021-06-04 LAB — BASIC METABOLIC PANEL
Anion gap: 3 — ABNORMAL LOW (ref 5–15)
BUN: 22 mg/dL — ABNORMAL HIGH (ref 6–20)
CO2: 25 mmol/L (ref 22–32)
Calcium: 8.9 mg/dL (ref 8.9–10.3)
Chloride: 109 mmol/L (ref 98–111)
Creatinine, Ser: 0.95 mg/dL (ref 0.44–1.00)
GFR, Estimated: >60 mL/min (ref >60)
Glucose, Bld: 111 mg/dL — ABNORMAL HIGH (ref 70–99)
Potassium: 4.2 mmol/L (ref 3.5–5.1)
Sodium: 137 mmol/L (ref 135–145)

## 2021-06-04 MED ORDER — SODIUM CHLORIDE 0.9 % IV SOLN
6.0000 mg/kg | Freq: Once | INTRAVENOUS | Status: AC
  Administered 2021-06-04: 525 mg via INTRAVENOUS
  Filled 2021-06-04: qty 25

## 2021-06-04 MED ORDER — LIDOCAINE-PRILOCAINE 2.5-2.5 % EX CREA: TOPICAL_CREAM | CUTANEOUS | 0 refills | Status: DC

## 2021-06-04 MED ORDER — HEPARIN SOD (PORK) LOCK FLUSH 100 UNIT/ML IV SOLN
INTRAVENOUS | Status: AC
  Filled 2021-06-04: qty 5

## 2021-06-04 MED ORDER — ACETAMINOPHEN 325 MG PO TABS
ORAL_TABLET | ORAL | Status: AC
  Filled 2021-06-04: qty 2

## 2021-06-04 MED ORDER — ACETAMINOPHEN 325 MG PO TABS
650.0000 mg | ORAL_TABLET | Freq: Once | ORAL | Status: AC
  Administered 2021-06-04: 650 mg via ORAL

## 2021-06-04 MED ORDER — HEPARIN SOD (PORK) LOCK FLUSH 100 UNIT/ML IV SOLN
500.0000 [IU] | Freq: Once | INTRAVENOUS | Status: AC
  Administered 2021-06-04: 500 [IU] via INTRAVENOUS
  Filled 2021-06-04: qty 5

## 2021-06-04 MED ORDER — SODIUM CHLORIDE 0.9 % IV SOLN
Freq: Once | INTRAVENOUS | Status: AC
  Filled 2021-06-04: qty 250

## 2021-06-04 MED ORDER — SODIUM CHLORIDE 0.9% FLUSH
10.0000 mL | Freq: Once | INTRAVENOUS | Status: AC
  Administered 2021-06-04: 10 mL via INTRAVENOUS
  Filled 2021-06-04: qty 10

## 2021-06-04 MED ORDER — DIPHENHYDRAMINE HCL 25 MG PO CAPS
25.0000 mg | ORAL_CAPSULE | Freq: Once | ORAL | Status: AC
  Administered 2021-06-04: 25 mg via ORAL

## 2021-06-04 MED ORDER — DIPHENHYDRAMINE HCL 25 MG PO CAPS
ORAL_CAPSULE | ORAL | Status: AC
  Filled 2021-06-04: qty 1

## 2021-06-04 NOTE — Progress Notes (Signed)
[facility] NOTE  Patient Care Team: [name], MD as PCP - General (Internal Medicine) [name], RN as Registered Nurse  CHIEF COMPLAINTS/PURPOSE OF CONSULTATION: Breast cancer    Oncology History Overview Note  # April 2022-stage Ia mammary carcinoma ER/PR positive HER2 positive [TRIPLE positive]; negative margins s/p simple mastectomy with plan for immediate reconstruction [Dr.Rodenberg/[name]]; NO RT  # May 2nd, 2022- Taxol-Herceptin  # MUGA scan- 64% [03/06/2021]-   # LMP- mid 2020; Uterine ablation- [id number]; # HTN; Asthma- well controlled [on allergy shots];Marland Kitchen    Malignant neoplasm of lower-outer quadrant of left breast of female, estrogen receptor positive ([place])  12/25/2020 Initial Diagnosis   Malignant neoplasm of lower-outer quadrant of left breast of female, estrogen receptor positive ([place])    02/26/2021 Cancer Staging   Staging form: Breast, AJCC 8th Edition - Pathologic: Stage IA (pT1c, pN0, cM0, G3, ER+, PR+, HER2+) - Signed by Cammie Sickle, MD on 02/26/2021  Mitotic count score: Score 3  Histologic grading system: 3 grade system    03/10/2021 -  Chemotherapy    Patient is on Treatment Plan: BREAST PACLITAXEL + TRASTUZUMAB Q7D / TRASTUZUMAB Q21D        Genetic Testing   Negative genetic testing. No pathogenic variants identified on the [facility] Multi-Cancer+RNA panel. 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 The report date is 04/29/2021.  The Multi-Cancer Panel + RNA offered by [facility] includes sequencing and/or deletion duplication testing of the following 84 genes: AIP, ALK, APC, ATM, AXIN2,BAP1,  BARD1, BLM, BMPR1A, BRCA1, BRCA2, BRIP1, CASR, CDC73, CDH1, CDK4, CDKN1B, CDKN1C, CDKN2A (p14ARF), CDKN2A (p16INK4a), CEBPA, CHEK2, CTNNA1, DICER1, DIS3L2, EGFR (c.2369C>T, p.Thr790Met variant only), EPCAM (Deletion/duplication testing only), FH, FLCN, GATA2, GPC3, GREM1 (Promoter region deletion/duplication testing only),  HOXB13 (c.251G>A, p.Gly84Glu), HRAS, KIT, MAX, MEN1, MET, MITF (c.952G>A, p.Glu318Lys variant only), MLH1, MSH2, MSH3, MSH6, MUTYH, NBN, NF1, NF2, NTHL1, PALB2, PDGFRA, PHOX2B, PMS2, POLD1, POLE, POT1, PRKAR1A, PTCH1, PTEN, RAD50, RAD51C, RAD51D, RB1, RECQL4, RET, RUNX1, SDHAF2, SDHA (sequence changes only), SDHB, SDHC, SDHD, SMAD4, SMARCA4, SMARCB1, SMARCE1, STK11, SUFU, TERC, TERT, TMEM127, TP53, TSC1, TSC2, VHL, WRN and WT1.      HISTORY OF PRESENTING ILLNESS:  Judy Padilla 55 y.o.  female newly diagnosed breast cancer stage I ER/PR positive HER2/neu positive on Taxol Herceptin adjuvant chemotherapy is here for follow-up.  Patient complains of continued hyperpigmentation of the dorsum of the hands.  Complains of mild tenderness in the fingertips.  Otherwise no significant tingling or numbness.  No new shortness of breath or cough.  Review of Systems  Constitutional:  Negative for chills, diaphoresis, fever, malaise/fatigue and weight loss.  HENT:  Negative for nosebleeds and sore throat.   Eyes:  Negative for double vision.  Respiratory:  Negative for cough, hemoptysis, sputum production, shortness of breath and wheezing.   Cardiovascular:  Negative for chest pain, palpitations, orthopnea and leg swelling.  Gastrointestinal:  Negative for abdominal pain, blood in stool, constipation, diarrhea, heartburn, melena, nausea and vomiting.  Genitourinary:  Negative for dysuria, frequency and urgency.  Musculoskeletal:  Negative for back pain and joint pain.  Skin: Negative.  Negative for itching and rash.  Neurological:  Negative for dizziness, tingling, focal weakness, weakness and headaches.  Endo/Heme/Allergies:  Does not bruise/bleed easily.  Psychiatric/Behavioral:  Negative for depression. The patient is not nervous/anxious and does not have insomnia.     MEDICAL HISTORY:  Past Medical History:  Diagnosis Date   Asthma    well controlled  Breast cancer ([place])    Family history  of adverse reaction to anesthesia    sister-hard time waking up   Family history of breast cancer    Family history of prostate cancer    Family history of uterine cancer    GERD (gastroesophageal reflux disease)    Hypertension     SURGICAL HISTORY: Past Surgical History:  Procedure Laterality Date   ABLATION     BREAST BIOPSY Left 2011   Benign per pt   BREAST BIOPSY Left 12/12/2020   3:30 3 cmfn, Q marker, path pending   BREAST BIOPSY Left 12/12/2020   3:30 1 cmfn, Vision marker, path pending   BREAST RECONSTRUCTION WITH PLACEMENT OF TISSUE EXPANDER AND FLEX HD (ACELLULAR HYDRATED DERMIS) Left 02/10/2021   Procedure: IMMEDIATE LEFT BREAST RECONSTRUCTION WITH PLACEMENT OF TISSUE EXPANDER AND FLEX HD (ACELLULAR HYDRATED DERMIS);  Surgeon: Wallace Going, DO;  Location: [facility] ORS;  Service: Plastics;  Laterality: Left;   COLONOSCOPY  01/15/2020   PORTACATH PLACEMENT Right 02/10/2021   Procedure: INSERTION PORT-A-CATH;  Surgeon: Ronny Bacon, MD;  Location: [facility] ORS;  Service: General;  Laterality: Right;   SIMPLE MASTECTOMY WITH AXILLARY SENTINEL NODE BIOPSY Left 02/10/2021   Procedure: SIMPLE MASTECTOMY WITH AXILLARY SENTINEL NODE BIOPSY;  Surgeon: Ronny Bacon, MD;  Location: [facility] ORS;  Service: General;  Laterality: Left;    SOCIAL HISTORY: Social History   Socioeconomic History   Marital status: Married    Spouse name: Not on file   Number of children: Not on file   Years of education: Not on file   Highest education level: Not on file  Occupational History   Not on file  Tobacco Use   Smoking status: Never   Smokeless tobacco: Never  Vaping Use   Vaping Use: Never used  Substance and Sexual Activity   Alcohol use: Not Currently   Drug use: Never   Sexual activity: Not on file  Other Topics Concern   Not on file  Social History Narrative   Lives in [place] with husband; kids- college. Works for [employer]- working from home. No smoking  or alcohol.    Social Determinants of Health   Financial Resource Strain: Not on file  Food Insecurity: Not on file  Transportation Needs: Not on file  Physical Activity: Not on file  Stress: Not on file  Social Connections: Not on file  Intimate Partner Violence: Not on file    FAMILY HISTORY: Family History  Problem Relation Age of Onset   Hypertension Mother    Diabetes Mother    Hypertension Father    Diabetes Father    Cancer Father        prostate cancer-70s   Cancer Maternal Grandmother        uterine cancer   Breast cancer Sister        in in 76s.     ALLERGIES:  is allergic to shrimp extract allergy skin test and other.  MEDICATIONS:  Current Outpatient Medications  Medication Sig Dispense Refill   albuterol (VENTOLIN HFA) 108 (90 Base) MCG/ACT inhaler Inhale 1 puff into the lungs every 6 (six) hours as needed for shortness of breath.     amLODipine (NORVASC) 10 MG tablet Take 10 mg by mouth every morning.     budesonide (PULMICORT) 0.5 MG/2ML nebulizer solution Take 2 mLs by nebulization daily.     cetirizine (ZYRTEC) 10 MG tablet Take 10 mg by mouth daily as needed for allergies.  Cholecalciferol 1.25 MG (50000 UT) capsule Take 50,000 Units by mouth once a week.     EPINEPHrine 0.3 mg/0.3 mL IJ SOAJ injection Inject 0.3 mg into the muscle as needed for anaphylaxis.     fluticasone (FLONASE) 50 MCG/ACT nasal spray Place 1 spray into both nostrils daily as needed for allergies.     fluticasone furoate-vilanterol (BREO ELLIPTA) 100-25 MCG/INH AEPB Inhale 1 puff into the lungs as needed for shortness of breath.     GNP BUDESONIDE NASAL SPRAY NA Place 1 spray into the nose daily.     Multiple Vitamins-Minerals (CENTRUM ADULTS) TABS Take 1 tablet by mouth daily.     naproxen sodium (ALEVE) 220 MG tablet Take 220 mg by mouth daily as needed (knee pain).     NON FORMULARY Takes weekly allergy shots     rosuvastatin (CRESTOR) 40 MG tablet Take 40 mg by mouth daily.      lidocaine-prilocaine (EMLA) cream Apply 30 -45 mins prior to port access. 50 g 0   ondansetron (ZOFRAN) 4 MG tablet Take 1 tablet (4 mg total) by mouth every 8 (eight) hours as needed for nausea or vomiting. (Patient not taking: No sig reported) 20 tablet 0   ondansetron (ZOFRAN) 8 MG tablet One pill every 8 hours as needed for nausea/vomitting. (Patient not taking: No sig reported) 40 tablet 1   pantoprazole (PROTONIX) 40 MG tablet Take 40 mg by mouth every morning. (Patient not taking: Reported on 06/04/2021)     prochlorperazine (COMPAZINE) 10 MG tablet Take 1 tablet (10 mg total) by mouth every 6 (six) hours as needed for nausea or vomiting. (Patient not taking: Reported on 06/04/2021) 40 tablet 1   No current facility-administered medications for this visit.      Marland Kitchen  PHYSICAL EXAMINATION: ECOG PERFORMANCE STATUS: 0 - Asymptomatic  Vitals:   06/04/21 0953  BP: 124/80  Pulse: 94  Resp: 16  Temp: (!) 97.2 F (36.2 C)  SpO2: 100%   Filed Weights   06/04/21 0953  Weight: 190 lb (86.2 kg)    Physical Exam Constitutional:      Comments: Accompanied by husband.  Ambulating independently.  HENT:     Head: Normocephalic and atraumatic.     Mouth/Throat:     Pharynx: No oropharyngeal exudate.  Eyes:     Pupils: Pupils are equal, round, and reactive to light.  Cardiovascular:     Rate and Rhythm: Normal rate and regular rhythm.  Pulmonary:     Effort: Pulmonary effort is normal. No respiratory distress.     Breath sounds: Normal breath sounds. No wheezing.  Abdominal:     General: Bowel sounds are normal. There is no distension.     Palpations: Abdomen is soft. There is no mass.     Tenderness: no abdominal tenderness There is no guarding or rebound.  Musculoskeletal:        General: No tenderness. Normal range of motion.     Cervical back: Normal range of motion and neck supple.  Skin:    General: Skin is warm.  Neurological:     Mental Status: She is alert and oriented  to person, place, and time.  Psychiatric:        Mood and Affect: Affect normal.     LABORATORY DATA:  I have reviewed the data as listed Lab Results  Component Value Date   WBC 5.6 06/04/2021   HGB 10.6 (L) 06/04/2021   HCT 32.3 (L) 06/04/2021   MCV 84.6  06/04/2021   PLT 273 06/04/2021   Recent Labs    05/13/21 0836 05/21/21 0801 05/28/21 0823 06/04/21 0924 06/04/21 1052  NA 138 137 139 142 137  K 4.3 4.2 4.0 2.0* 4.2  CL 106 106 108 129* 109  CO2 '24 24 23 ' 13* 25  GLUCOSE 104* 97 112* 67* 111*  BUN '17 17 17 13 ' 22*  CREATININE 0.95 0.92 0.84 0.40* 0.95  CALCIUM 8.9 8.9 8.8* 4.4* 8.9  GFRNONAA >60 >60 >60 >60 >60  PROT 6.4* 6.4*  --  3.2*  --   ALBUMIN 3.9 3.9  --  2.0*  --   AST 28 29  --  12*  --   ALT 53* 53*  --  20  --   ALKPHOS 68 66  --  31*  --   BILITOT 0.7 0.4  --  0.3  --     RADIOGRAPHIC STUDIES: I have personally reviewed the radiological images as listed and agreed with the findings in the report. No results found.  ASSESSMENT & PLAN:   Malignant neoplasm of lower-outer quadrant of left breast of female, estrogen receptor positive ([place]) # Left breast- CA- s/p mastectomy-  pT1c pN0 [Stage IA]Grade 3.  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  On adjuvant Taxol Herceptin.  #Proceed with cycle #4 herceptin q3 W MUGA scan every 3 to 4 months/order today.  Today again reviewed the schedule for ongoing Herceptin/every 3 weeks for total of 1 year.  Discuss antiestrogen therapy.   #Fatigue: Likely from the chemotherapy; grade 1.  Continue increased physical activity.  # COVID Booster: Reviewed the CDC guidelines ;ok to proceed with Booster #2 in 2 weeks.   # Bil hands- dorsum hyperpigmentation recommend urea cream 40%+ SA.   # DISPOSITION: # herceptin today  # on Aug 15th- [mondays]-MD; labs- cbc/cmp; Herceptin; MUGA scan prior-- Dr.B  All questions were answered. The patient/family knows to call the clinic with any problems, questions or concerns.     Cammie Sickle, MD 06/04/2021 7:31 PM

## 2021-06-04 NOTE — Patient Instructions (Signed)
[facility]  Discharge Instructions: Thank you for choosing [facility] to provide your oncology and hematology care.  If you have a lab appointment with the [facility], please go directly to the [facility] and check in at the registration area.  Wear comfortable clothing and clothing appropriate for easy access to any Portacath or PICC line.   We strive to give you quality time with your provider. You may need to reschedule your appointment if you arrive late (15 or more minutes).  Arriving late affects you and other patients whose appointments are after yours.  Also, if you miss three or more appointments without notifying the office, you may be dismissed from the clinic at the provider's discretion.      For prescription refill requests, have your pharmacy contact our office and allow 72 hours for refills to be completed.    Today you received the following chemotherapy and/or immunotherapy agents KANJINTI      To help prevent nausea and vomiting after your treatment, we encourage you to take your nausea medication as directed.  BELOW ARE SYMPTOMS THAT SHOULD BE REPORTED IMMEDIATELY: *FEVER GREATER THAN 100.4 F (38 C) OR HIGHER *CHILLS OR SWEATING *NAUSEA AND VOMITING THAT IS NOT CONTROLLED WITH YOUR NAUSEA MEDICATION *UNUSUAL SHORTNESS OF BREATH *UNUSUAL BRUISING OR BLEEDING *URINARY PROBLEMS (pain or burning when urinating, or frequent urination) *BOWEL PROBLEMS (unusual diarrhea, constipation, pain near the anus) TENDERNESS IN MOUTH AND THROAT WITH OR WITHOUT PRESENCE OF ULCERS (sore throat, sores in mouth, or a toothache) UNUSUAL RASH, SWELLING OR PAIN  UNUSUAL VAGINAL DISCHARGE OR ITCHING   Items with * indicate a potential emergency and should be followed up as soon as possible or go to the Emergency Department if any problems should occur.  Please show the CHEMOTHERAPY ALERT CARD or IMMUNOTHERAPY ALERT CARD at check-in to  the Emergency Department and triage nurse.  Should you have questions after your visit or need to cancel or reschedule your appointment, please contact [facility]  [phone] and follow the prompts.  Office hours are 8:00 a.m. to 4:30 p.m. Monday - Friday. Please note that voicemails left after 4:00 p.m. may not be returned until the following business day.  We are closed weekends and major holidays. You have access to a nurse at all times for urgent questions. Please call the main number to the clinic [phone] and follow the prompts.  For any non-urgent questions, you may also contact your provider using MyChart. We now offer e-Visits for anyone 67 and older to request care online for non-urgent symptoms. For details visit [email or web address].   Also download the MyChart app! Go to the app store, search "MyChart", open the app, select [facility], and log in with your MyChart username and password.  Due to Covid, a mask is required upon entering the hospital/clinic. If you do not have a mask, one will be given to you upon arrival. For doctor visits, patients may have 1 support person aged 70 or older with them. For treatment visits, patients cannot have anyone with them due to current Covid guidelines and our immunocompromised population.    Trastuzumab injection for infusion What is this medication? TRASTUZUMAB (tras TOO zoo mab) is a monoclonal antibody. It is used to treatbreast cancer and stomach cancer. This medicine may be used for other purposes; ask your health care provider orpharmacist if you have questions. COMMON BRAND NAME(S): Herceptin, Donnald Garre What  should I tell my care team before I take this medication? They need to know if you have any of these conditions: heart disease heart failure lung or breathing disease, like asthma an unusual or allergic reaction to trastuzumab, benzyl alcohol, or  other medications, foods, dyes, or preservatives pregnant or trying to get pregnant breast-feeding How should I use this medication? This drug is given as an infusion into a vein. It is administered in a hospitalor clinic by a specially trained health care professional. Talk to your pediatrician regarding the use of this medicine in children. Thismedicine is not approved for use in children. Overdosage: If you think you have taken too much of this medicine contact apoison control center or emergency room at once. NOTE: This medicine is only for you. Do not share this medicine with others. What if I miss a dose? It is important not to miss a dose. Call your doctor or health careprofessional if you are unable to keep an appointment. What may interact with this medication? This medicine may interact with the following medications: certain types of chemotherapy, such as daunorubicin, doxorubicin, epirubicin, and idarubicin This list may not describe all possible interactions. Give your health care provider a list of all the medicines, herbs, non-prescription drugs, or dietary supplements you use. Also tell them if you smoke, drink alcohol, or use illegaldrugs. Some items may interact with your medicine. What should I watch for while using this medication? Visit your doctor for checks on your progress. Report any side effects. Continue your course of treatment even though you feel ill unless your doctortells you to stop. Call your doctor or health care professional for advice if you get a fever, chills or sore throat, or other symptoms of a cold or flu. Do not treatyourself. Try to avoid being around people who are sick. You may experience fever, chills and shaking during your first infusion. These effects are usually mild and can be treated with other medicines. Report any side effects during the infusion to your health care professional. Fever andchills usually do not happen with later infusions. Do  not become pregnant while taking this medicine or for 7 months after stopping it. Women should inform their doctor if they wish to become pregnant or think they might be pregnant. Women of child-bearing potential will need to have a negative pregnancy test before starting this medicine. There is a potential for serious side effects to an unborn child. Talk to your health care professional or pharmacist for more information. Do not breast-feed an infantwhile taking this medicine or for 7 months after stopping it. Women must use effective birth control with this medicine. What side effects may I notice from receiving this medication? Side effects that you should report to your doctor or health care professionalas soon as possible: allergic reactions like skin rash, itching or hives, swelling of the face, lips, or tongue chest pain or palpitations cough dizziness feeling faint or lightheaded, falls fever general ill feeling or flu-like symptoms signs of worsening heart failure like breathing problems; swelling in your legs and feet unusually weak or tired Side effects that usually do not require medical attention (report to yourdoctor or health care professional if they continue or are bothersome): bone pain changes in taste diarrhea joint pain nausea/vomiting weight loss This list may not describe all possible side effects. Call your doctor for medical advice about side effects. You may report side effects to FDA at1-800-FDA-1088. Where should I keep my medication? This drug is given  in a hospital or clinic and will not be stored at home. NOTE: This sheet is a summary. It may not cover all possible information. If you have questions about this medicine, talk to your doctor, pharmacist, orhealth care provider.  2022 Elsevier/Gold Standard (2016-10-20 14:37:52)  

## 2021-06-04 NOTE — Assessment & Plan Note (Addendum)
#  Left breast- CA- s/p mastectomy-  pT1c pN0 [Stage IA]Grade 3.  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  On adjuvant Taxol Herceptin.  #Proceed with cycle #4 herceptin q3 W MUGA scan every 3 to 4 months/order today.  Today again reviewed the schedule for ongoing Herceptin/every 3 weeks for total of 1 year.  Discuss antiestrogen therapy.   #Fatigue: Likely from the chemotherapy; grade 1.  Continue increased physical activity.  # COVID Booster: Reviewed the CDC guidelines ;ok to proceed with Booster #2 in 2 weeks.   # Bil hands- dorsum hyperpigmentation recommend urea cream 40%+ SA.   # DISPOSITION: # herceptin today  # on Aug 15th- [mondays]-MD; labs- cbc/cmp; Herceptin; MUGA scan prior-- Dr.B

## 2021-06-05 ENCOUNTER — Encounter: Payer: Self-pay | Admitting: Internal Medicine

## 2021-06-05 DIAGNOSIS — J301 Allergic rhinitis due to pollen: Secondary | ICD-10-CM | POA: Diagnosis not present

## 2021-06-12 DIAGNOSIS — J301 Allergic rhinitis due to pollen: Secondary | ICD-10-CM | POA: Diagnosis not present

## 2021-06-16 ENCOUNTER — Ambulatory Visit
Admission: RE | Admit: 2021-06-16 | Discharge: 2021-06-16 | Disposition: A | Discharge status: Home / Self Care | Payer: No Typology Code available for payment source | Source: Ambulatory Visit | Attending: Internal Medicine | Admitting: Internal Medicine

## 2021-06-16 ENCOUNTER — Other Ambulatory Visit: Payer: Self-pay

## 2021-06-16 DIAGNOSIS — Z09 Encounter for follow-up examination after completed treatment for conditions other than malignant neoplasm: Secondary | ICD-10-CM | POA: Insufficient documentation

## 2021-06-16 DIAGNOSIS — C50919 Malignant neoplasm of unspecified site of unspecified female breast: Secondary | ICD-10-CM | POA: Diagnosis not present

## 2021-06-16 DIAGNOSIS — C50512 Malignant neoplasm of lower-outer quadrant of left female breast: Secondary | ICD-10-CM | POA: Diagnosis not present

## 2021-06-16 DIAGNOSIS — Z170 Estrogen receptor positive status [ER+]: Secondary | ICD-10-CM | POA: Insufficient documentation

## 2021-06-16 MED ORDER — TECHNETIUM TC 99M-LABELED RED BLOOD CELLS IV KIT
20.0000 | PACK | Freq: Once | INTRAVENOUS | Status: AC | PRN
  Administered 2021-06-16: 20.71 via INTRAVENOUS

## 2021-06-19 DIAGNOSIS — J301 Allergic rhinitis due to pollen: Secondary | ICD-10-CM | POA: Diagnosis not present

## 2021-06-23 ENCOUNTER — Inpatient Hospital Stay: Payer: No Typology Code available for payment source | Attending: Internal Medicine

## 2021-06-23 ENCOUNTER — Inpatient Hospital Stay (HOSPITAL_BASED_OUTPATIENT_CLINIC_OR_DEPARTMENT_OTHER): Payer: No Typology Code available for payment source | Admitting: Internal Medicine

## 2021-06-23 ENCOUNTER — Inpatient Hospital Stay: Payer: No Typology Code available for payment source

## 2021-06-23 ENCOUNTER — Encounter: Payer: Self-pay | Admitting: Internal Medicine

## 2021-06-23 VITALS — BP 136/82 | HR 79 | Temp 97.4°F | Resp 16 | Ht 68.0 in | Wt 192.0 lb

## 2021-06-23 DIAGNOSIS — Z5112 Encounter for antineoplastic immunotherapy: Secondary | ICD-10-CM | POA: Insufficient documentation

## 2021-06-23 DIAGNOSIS — Z13820 Encounter for screening for osteoporosis: Secondary | ICD-10-CM

## 2021-06-23 DIAGNOSIS — J45909 Unspecified asthma, uncomplicated: Secondary | ICD-10-CM | POA: Diagnosis not present

## 2021-06-23 DIAGNOSIS — I10 Essential (primary) hypertension: Secondary | ICD-10-CM | POA: Insufficient documentation

## 2021-06-23 DIAGNOSIS — L819 Disorder of pigmentation, unspecified: Secondary | ICD-10-CM | POA: Insufficient documentation

## 2021-06-23 DIAGNOSIS — C50512 Malignant neoplasm of lower-outer quadrant of left female breast: Secondary | ICD-10-CM | POA: Insufficient documentation

## 2021-06-23 DIAGNOSIS — Z170 Estrogen receptor positive status [ER+]: Secondary | ICD-10-CM | POA: Insufficient documentation

## 2021-06-23 LAB — COMPREHENSIVE METABOLIC PANEL
ALT: 31 U/L (ref 0–44)
AST: 24 U/L (ref 15–41)
Albumin: 4 g/dL (ref 3.5–5.0)
Alkaline Phosphatase: 78 U/L (ref 38–126)
Anion gap: 7 (ref 5–15)
BUN: 19 mg/dL (ref 6–20)
CO2: 24 mmol/L (ref 22–32)
Calcium: 8.9 mg/dL (ref 8.9–10.3)
Chloride: 107 mmol/L (ref 98–111)
Creatinine, Ser: 0.79 mg/dL (ref 0.44–1.00)
GFR, Estimated: >60 mL/min (ref >60)
Glucose, Bld: 106 mg/dL — ABNORMAL HIGH (ref 70–99)
Potassium: 3.9 mmol/L (ref 3.5–5.1)
Sodium: 138 mmol/L (ref 135–145)
Total Bilirubin: 0.6 mg/dL (ref 0.3–1.2)
Total Protein: 6.8 g/dL (ref 6.5–8.1)

## 2021-06-23 LAB — CBC WITH DIFFERENTIAL/PLATELET
Abs Immature Granulocytes: 0.04 10*3/uL (ref 0.00–0.07)
Basophils Absolute: 0 10*3/uL (ref 0.0–0.1)
Basophils Relative: 1 %
Eosinophils Absolute: 0.2 10*3/uL (ref 0.0–0.5)
Eosinophils Relative: 4 %
HCT: 33.8 % — ABNORMAL LOW (ref 36.0–46.0)
Hemoglobin: 11 g/dL — ABNORMAL LOW (ref 12.0–15.0)
Immature Granulocytes: 1 %
Lymphocytes Relative: 34 %
Lymphs Abs: 1.8 10*3/uL (ref 0.7–4.0)
MCH: 27.3 pg (ref 26.0–34.0)
MCHC: 32.5 g/dL (ref 30.0–36.0)
MCV: 83.9 fL (ref 80.0–100.0)
Monocytes Absolute: 0.6 10*3/uL (ref 0.1–1.0)
Monocytes Relative: 12 %
Neutro Abs: 2.6 10*3/uL (ref 1.7–7.7)
Neutrophils Relative %: 48 %
Platelets: 241 10*3/uL (ref 150–400)
RBC: 4.03 MIL/uL (ref 3.87–5.11)
RDW: 17.3 % — ABNORMAL HIGH (ref 11.5–15.5)
WBC: 5.4 10*3/uL (ref 4.0–10.5)
nRBC: 0 % (ref 0.0–0.2)

## 2021-06-23 MED ORDER — DIPHENHYDRAMINE HCL 25 MG PO CAPS
25.0000 mg | ORAL_CAPSULE | Freq: Once | ORAL | Status: AC
  Administered 2021-06-23: 25 mg via ORAL
  Filled 2021-06-23: qty 1

## 2021-06-23 MED ORDER — SODIUM CHLORIDE 0.9 % IV SOLN
6.0000 mg/kg | Freq: Once | INTRAVENOUS | Status: AC
  Administered 2021-06-23: 525 mg via INTRAVENOUS
  Filled 2021-06-23: qty 25

## 2021-06-23 MED ORDER — SODIUM CHLORIDE 0.9 % IV SOLN
Freq: Once | INTRAVENOUS | Status: AC
  Filled 2021-06-23: qty 250

## 2021-06-23 MED ORDER — ACETAMINOPHEN 325 MG PO TABS
650.0000 mg | ORAL_TABLET | Freq: Once | ORAL | Status: AC
  Administered 2021-06-23: 650 mg via ORAL
  Filled 2021-06-23: qty 2

## 2021-06-23 MED ORDER — HEPARIN SOD (PORK) LOCK FLUSH 100 UNIT/ML IV SOLN
500.0000 [IU] | Freq: Once | INTRAVENOUS | Status: AC | PRN
  Administered 2021-06-23: 500 [IU]
  Filled 2021-06-23: qty 5

## 2021-06-23 NOTE — Patient Instructions (Signed)
[facility]  Discharge Instructions: Thank you for choosing [facility] to provide your oncology and hematology care.  If you have a lab appointment with the [facility], please go directly to the [facility] and check in at the registration area.  Wear comfortable clothing and clothing appropriate for easy access to any Portacath or PICC line.   We strive to give you quality time with your provider. You may need to reschedule your appointment if you arrive late (15 or more minutes).  Arriving late affects you and other patients whose appointments are after yours.  Also, if you miss three or more appointments without notifying the office, you may be dismissed from the clinic at the provider's discretion.      For prescription refill requests, have your pharmacy contact our office and allow 72 hours for refills to be completed.    Today you received the following chemotherapy and/or immunotherapy agents : Herceptin   To help prevent nausea and vomiting after your treatment, we encourage you to take your nausea medication as directed.  BELOW ARE SYMPTOMS THAT SHOULD BE REPORTED IMMEDIATELY: *FEVER GREATER THAN 100.4 F (38 C) OR HIGHER *CHILLS OR SWEATING *NAUSEA AND VOMITING THAT IS NOT CONTROLLED WITH YOUR NAUSEA MEDICATION *UNUSUAL SHORTNESS OF BREATH *UNUSUAL BRUISING OR BLEEDING *URINARY PROBLEMS (pain or burning when urinating, or frequent urination) *BOWEL PROBLEMS (unusual diarrhea, constipation, pain near the anus) TENDERNESS IN MOUTH AND THROAT WITH OR WITHOUT PRESENCE OF ULCERS (sore throat, sores in mouth, or a toothache) UNUSUAL RASH, SWELLING OR PAIN  UNUSUAL VAGINAL DISCHARGE OR ITCHING   Items with * indicate a potential emergency and should be followed up as soon as possible or go to the Emergency Department if any problems should occur.  Please show the CHEMOTHERAPY ALERT CARD or IMMUNOTHERAPY ALERT CARD at check-in to  the Emergency Department and triage nurse.  Should you have questions after your visit or need to cancel or reschedule your appointment, please contact [facility]  [phone] and follow the prompts.  Office hours are 8:00 a.m. to 4:30 p.m. Monday - Friday. Please note that voicemails left after 4:00 p.m. may not be returned until the following business day.  We are closed weekends and major holidays. You have access to a nurse at all times for urgent questions. Please call the main number to the clinic [phone] and follow the prompts.  For any non-urgent questions, you may also contact your provider using MyChart. We now offer e-Visits for anyone 105 and older to request care online for non-urgent symptoms. For details visit [email or web address].   Also download the MyChart app! Go to the app store, search "MyChart", open the app, select [facility], and log in with your MyChart username and password.  Due to Covid, a mask is required upon entering the hospital/clinic. If you do not have a mask, one will be given to you upon arrival. For doctor visits, patients may have 1 support person aged 51 or older with them. For treatment visits, patients cannot have anyone with them due to current Covid guidelines and our immunocompromised population.

## 2021-06-23 NOTE — Assessment & Plan Note (Addendum)
#  Left breast- CA- s/p mastectomy-  pT1c pN0 [Stage IA]Grade 3.  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  On adjuvant Herceptin.  #Proceed with cycle #4 herceptin q3 W. Labs today reviewed;  acceptable for treatment today.  Reviewed the slight decline in the ejection fraction with the patient at length.  Discussed that Herceptin induced low ejection fraction is usually reversible.  AUG 2022- LVEF- 56.8 %.Comparable to LVEF equals 63.8% on 03/05/2021;  MUGA scan every 3 to 4 months/order today.  # Discussed re: Anti-hormone-tamoxifen versus AI-based on postmenopausal status 5 to 10 years.  Discussed the potential side effects of each option at length. #Discussed the role of anti-hormonal therapy mechanism of action. Long discussion regarding the potential adverse events on tamoxifen including but not limited to hot flashes, mood swings, thromboembolic events strokes and also small risk of uterine cancers.  Also discussed the potential side effects of AI [letrozole] including but not limited to arthralgias hot flashes and increased risk of osteoporosis. Order BMD.  Tamoxifen versus AI based upon hormone status.  Last menstrual period 2020.  # Bil hands- dorsum hyperpigmentation recommend urea cream 40%+ SA.   # DISPOSITION: # herceptin today  # in 3 weeks- [mondays]-MD; labs- cbc/cmp;estroge/LH; [place]; Herceptin;BMD-Dr.B

## 2021-06-23 NOTE — Progress Notes (Signed)
one Judy Padilla NOTE  Patient Care Team: Judy Maltese, MD as PCP - General (Internal Medicine) Judy Junker, RN as Registered Nurse  CHIEF COMPLAINTS/PURPOSE OF CONSULTATION: Breast cancer    Oncology History Overview Note  # April 2022-stage Ia mammary carcinoma ER/PR positive HER2 positive [TRIPLE positive]; negative margins s/p simple mastectomy with plan for immediate reconstruction [Dr.Rodenberg/[name]]; NO RT  # May 2nd, 2022- Taxol-Herceptin  # MUGA scan- 64% [03/06/2021]-   # LMP- mid 2020; Uterine ablation- [phone]; # HTN; Asthma- well controlled [on allergy shots];Marland Kitchen    Malignant neoplasm of lower-outer quadrant of left breast of female, estrogen receptor positive ([place])  12/25/2020 Initial Diagnosis   Malignant neoplasm of lower-outer quadrant of left breast of female, estrogen receptor positive ([place])   02/26/2021 Cancer Staging   Staging form: Breast, AJCC 8th Edition - Pathologic: Stage IA (pT1c, pN0, cM0, G3, ER+, PR+, HER2+) - Signed by Cammie Sickle, MD on 02/26/2021 Mitotic count score: Score 3 Histologic grading system: 3 grade system   03/10/2021 -  Chemotherapy    Patient is on Treatment Plan: BREAST PACLITAXEL + TRASTUZUMAB Q7D / TRASTUZUMAB Q21D        Genetic Testing   Negative genetic testing. No pathogenic variants identified on the [facility] Multi-Cancer+RNA panel. 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 The report date is 04/29/2021.  The Multi-Cancer Panel + RNA offered by [facility] includes sequencing and/or deletion duplication testing of the following 84 genes: AIP, ALK, APC, ATM, AXIN2,BAP1,  BARD1, BLM, BMPR1A, BRCA1, BRCA2, BRIP1, CASR, CDC73, CDH1, CDK4, CDKN1B, CDKN1C, CDKN2A (p14ARF), CDKN2A (p16INK4a), CEBPA, CHEK2, CTNNA1, DICER1, DIS3L2, EGFR (c.2369C>T, p.Thr790Met variant only), EPCAM (Deletion/duplication testing only), FH, FLCN, GATA2, GPC3, GREM1 (Promoter region deletion/duplication testing only), HOXB13  (c.251G>A, p.Gly84Glu), HRAS, KIT, MAX, MEN1, MET, MITF (c.952G>A, p.Glu318Lys variant only), MLH1, MSH2, MSH3, MSH6, MUTYH, NBN, NF1, NF2, NTHL1, PALB2, PDGFRA, PHOX2B, PMS2, POLD1, POLE, POT1, PRKAR1A, PTCH1, PTEN, RAD50, RAD51C, RAD51D, RB1, RECQL4, RET, RUNX1, SDHAF2, SDHA (sequence changes only), SDHB, SDHC, SDHD, SMAD4, SMARCA4, SMARCB1, SMARCE1, STK11, SUFU, TERC, TERT, TMEM127, TP53, TSC1, TSC2, VHL, WRN and WT1.      HISTORY OF PRESENTING ILLNESS:  Judy Padilla 55 y.o.  female newly diagnosed breast cancer stage I ER/PR positive HER2/neu positive on adjuvant Herceptin is here for follow-up/review MUGA scan.  Patient denies any worsening shortness of breath or cough he denies any swelling in the legs.  Continues to have mild darkening of the skin of the hands.\\   Review of Systems  Constitutional:  Negative for chills, diaphoresis, fever, malaise/fatigue and weight loss.  HENT:  Negative for nosebleeds and sore throat.   Eyes:  Negative for double vision.  Respiratory:  Negative for cough, hemoptysis, sputum production, shortness of breath and wheezing.   Cardiovascular:  Negative for chest pain, palpitations, orthopnea and leg swelling.  Gastrointestinal:  Negative for abdominal pain, blood in stool, constipation, diarrhea, heartburn, melena, nausea and vomiting.  Genitourinary:  Negative for dysuria, frequency and urgency.  Musculoskeletal:  Negative for back pain and joint pain.  Skin: Negative.  Negative for itching and rash.  Neurological:  Negative for dizziness, tingling, focal weakness, weakness and headaches.  Endo/Heme/Allergies:  Does not bruise/bleed easily.  Psychiatric/Behavioral:  Negative for depression. The patient is not nervous/anxious and does not have insomnia.     MEDICAL HISTORY:  Past Medical History:  Diagnosis Date   Asthma    well controlled   Breast cancer ([place])    Family history  of adverse reaction to anesthesia    sister-hard time waking up    Family history of breast cancer    Family history of prostate cancer    Family history of uterine cancer    GERD (gastroesophageal reflux disease)    Hypertension     SURGICAL HISTORY: Past Surgical History:  Procedure Laterality Date   ABLATION     BREAST BIOPSY Left 2011   Benign per pt   BREAST BIOPSY Left 12/12/2020   3:30 3 cmfn, Q marker, path pending   BREAST BIOPSY Left 12/12/2020   3:30 1 cmfn, Vision marker, path pending   BREAST RECONSTRUCTION WITH PLACEMENT OF TISSUE EXPANDER AND FLEX HD (ACELLULAR HYDRATED DERMIS) Left 02/10/2021   Procedure: IMMEDIATE LEFT BREAST RECONSTRUCTION WITH PLACEMENT OF TISSUE EXPANDER AND FLEX HD (ACELLULAR HYDRATED DERMIS);  Surgeon: Judy Going, DO;  Location: [facility];  Service: Plastics;  Laterality: Left;   COLONOSCOPY  01/15/2020   PORTACATH PLACEMENT Right 02/10/2021   Procedure: INSERTION PORT-A-CATH;  Surgeon: Ronny Bacon, MD;  Location: [facility];  Service: General;  Laterality: Right;   SIMPLE MASTECTOMY WITH AXILLARY SENTINEL NODE BIOPSY Left 02/10/2021   Procedure: SIMPLE MASTECTOMY WITH AXILLARY SENTINEL NODE BIOPSY;  Surgeon: Ronny Bacon, MD;  Location: [facility];  Service: General;  Laterality: Left;    SOCIAL HISTORY: Social History   Socioeconomic History   Marital status: Married    Spouse name: Not on file   Number of children: Not on file   Years of education: Not on file   Highest education level: Not on file  Occupational History   Not on file  Tobacco Use   Smoking status: Never   Smokeless tobacco: Never  Vaping Use   Vaping Use: Never used  Substance and Sexual Activity   Alcohol use: Not Currently   Drug use: Never   Sexual activity: Not on file  Other Topics Concern   Not on file  Social History Narrative   Lives in [place] with husband; kids- college. Works for [employer]- working from home. No smoking or alcohol.    Social Determinants of Health   Financial  Resource Strain: Not on file  Food Insecurity: Not on file  Transportation Needs: Not on file  Physical Activity: Not on file  Stress: Not on file  Social Connections: Not on file  Intimate Partner Violence: Not on file    FAMILY HISTORY: Family History  Problem Relation Age of Onset   Hypertension Mother    Diabetes Mother    Hypertension Father    Diabetes Father    Cancer Father        prostate cancer-70s   Cancer Maternal Grandmother        uterine cancer   Breast cancer Sister        in in 50s.     ALLERGIES:  is allergic to shrimp extract allergy skin test and other.  MEDICATIONS:  Current Outpatient Medications  Medication Sig Dispense Refill   albuterol (VENTOLIN HFA) 108 (90 Base) MCG/ACT inhaler Inhale 1 puff into the lungs every 6 (six) hours as needed for shortness of breath.     amLODipine (NORVASC) 10 MG tablet Take 10 mg by mouth every morning.     budesonide (PULMICORT) 0.5 MG/2ML nebulizer solution Take 2 mLs by nebulization daily.     cetirizine (ZYRTEC) 10 MG tablet Take 10 mg by mouth daily as needed for allergies.     Cholecalciferol 1.25 MG (50000 UT) capsule Take  50,000 Units by mouth once a week.     EPINEPHrine 0.3 mg/0.3 mL IJ SOAJ injection Inject 0.3 mg into the muscle as needed for anaphylaxis.     fluticasone (FLONASE) 50 MCG/ACT nasal spray Place 1 spray into both nostrils daily as needed for allergies.     fluticasone furoate-vilanterol (BREO ELLIPTA) 100-25 MCG/INH AEPB Inhale 1 puff into the lungs as needed for shortness of breath.     GNP BUDESONIDE NASAL SPRAY NA Place 1 spray into the nose daily.     lidocaine-prilocaine (EMLA) cream Apply 30 -45 mins prior to port access. 50 g 0   Multiple Vitamins-Minerals (CENTRUM ADULTS) TABS Take 1 tablet by mouth daily.     naproxen sodium (ALEVE) 220 MG tablet Take 220 mg by mouth daily as needed (knee pain).     NON FORMULARY Takes weekly allergy shots     rosuvastatin (CRESTOR) 40 MG tablet Take  40 mg by mouth daily.     ondansetron (ZOFRAN) 4 MG tablet Take 1 tablet (4 mg total) by mouth every 8 (eight) hours as needed for nausea or vomiting. (Patient not taking: No sig reported) 20 tablet 0   ondansetron (ZOFRAN) 8 MG tablet One pill every 8 hours as needed for nausea/vomitting. (Patient not taking: No sig reported) 40 tablet 1   pantoprazole (PROTONIX) 40 MG tablet Take 40 mg by mouth every morning. (Patient not taking: No sig reported)     prochlorperazine (COMPAZINE) 10 MG tablet Take 1 tablet (10 mg total) by mouth every 6 (six) hours as needed for nausea or vomiting. (Patient not taking: No sig reported) 40 tablet 1   No current facility-administered medications for this visit.      Marland Kitchen  PHYSICAL EXAMINATION: ECOG PERFORMANCE STATUS: 0 - Asymptomatic  Vitals:   06/23/21 0935  BP: 136/82  Pulse: 79  Resp: 16  Temp: (!) 97.4 F (36.3 C)  SpO2: 100%   Filed Weights   06/23/21 0935  Weight: 192 lb (87.1 kg)    Physical Exam Constitutional:      Comments: Accompanied by husband.  Ambulating independently.  HENT:     Head: Normocephalic and atraumatic.     Mouth/Throat:     Pharynx: No oropharyngeal exudate.  Eyes:     Pupils: Pupils are equal, round, and reactive to light.  Cardiovascular:     Rate and Rhythm: Normal rate and regular rhythm.  Pulmonary:     Effort: Pulmonary effort is normal. No respiratory distress.     Breath sounds: Normal breath sounds. No wheezing.  Abdominal:     General: Bowel sounds are normal. There is no distension.     Palpations: Abdomen is soft. There is no mass.     Tenderness: There is no abdominal tenderness. There is no guarding or rebound.  Musculoskeletal:        General: No tenderness. Normal range of motion.     Cervical back: Normal range of motion and neck supple.  Skin:    General: Skin is warm.  Neurological:     Mental Status: She is alert and oriented to person, place, and time.  Psychiatric:        Mood and  Affect: Affect normal.     LABORATORY DATA:  I have reviewed the data as listed Lab Results  Component Value Date   WBC 5.4 06/23/2021   HGB 11.0 (L) 06/23/2021   HCT 33.8 (L) 06/23/2021   MCV 83.9 06/23/2021   PLT 241  06/23/2021   Recent Labs    05/21/21 0801 05/28/21 0823 06/04/21 0924 06/04/21 1052 06/23/21 0920  NA 137   < > 142 137 138  K 4.2   < > 2.0* 4.2 3.9  CL 106   < > 129* 109 107  CO2 24   < > 13* 25 24  GLUCOSE 97   < > 67* 111* 106*  BUN 17   < > 13 22* 19  CREATININE 0.92   < > 0.40* 0.95 0.79  CALCIUM 8.9   < > 4.4* 8.9 8.9  GFRNONAA >60   < > >60 >60 >60  PROT 6.4*  --  3.2*  --  6.8  ALBUMIN 3.9  --  2.0*  --  4.0  AST 29  --  12*  --  24  ALT 53*  --  20  --  31  ALKPHOS 66  --  31*  --  78  BILITOT 0.4  --  0.3  --  0.6   < > = values in this interval not displayed.    RADIOGRAPHIC STUDIES: I have personally reviewed the radiological images as listed and agreed with the findings in the report. NM Cardiac [place] Rest  Result Date: 06/18/2021 CLINICAL DATA:  Breast cancer. Evaluate cardiac function in relation to chemotherapy. EXAM: NUCLEAR MEDICINE CARDIAC BLOOD POOL IMAGING (MUGA) TECHNIQUE: Cardiac multi-gated acquisition was performed at rest following intravenous injection of Tc-53mlabeled red blood cells. RADIOPHARMACEUTICALS:  20.7 mCi Tc-979mertechnetate in-vitro labeled red blood cells IV COMPARISON:  MUGA scan 03/05/2021 FINDINGS: No  focal wall motion abnormality of the left ventricle. Calculated left ventricular ejection fraction equals 56.8 %. Comparable to LVEF equals 63.8% on 03/05/2021. IMPRESSION: Left ventricular ejection fraction equals 56.8 %. Electronically Signed   By: StSuzy Bouchard.D.   On: 06/18/2021 14:58    ASSESSMENT & PLAN:   Malignant neoplasm of lower-outer quadrant of left breast of female, estrogen receptor positive ([place]# Left breast- CA- s/p mastectomy-  pT1c pN0 [Stage IA]Grade 3.  ER/PR-positive HER-2/neu-  POSITIVE.  On adjuvant Herceptin.  #Proceed with cycle #4 herceptin q3 W. Labs today reviewed;  acceptable for treatment today.  Reviewed the slight decline in the ejection fraction with the patient at length.  Discussed that Herceptin induced low ejection fraction is usually reversible.  AUG 2022- LVEF- 56.8 %.Comparable to LVEF equals 63.8% on 03/05/2021;  MUGA scan every 3 to 4 months/order today.  # Discussed re: Anti-hormone-tamoxifen versus AI-based on postmenopausal status 5 to 10 years.  Discussed the potential side effects of each option at length. #Discussed the role of anti-hormonal therapy mechanism of action. Long discussion regarding the potential adverse events on tamoxifen including but not limited to hot flashes, mood swings, thromboembolic events strokes and also small risk of uterine cancers.  Also discussed the potential side effects of AI [letrozole] including but not limited to arthralgias hot flashes and increased risk of osteoporosis. Order BMD.  Tamoxifen versus AI based upon hormone status.  # Bil hands- dorsum hyperpigmentation recommend urea cream 40%+ SA.   # DISPOSITION: # herceptin today  # in 3 weeks- [mondays]-MD; labs- cbc/cmp;estroge/LH; [place];BMD-Dr.B  All questions were answered. The patient/family knows to call the clinic with any problems, questions or concerns.    GoCammie SickleMD 06/23/2021 12:37 PM

## 2021-06-26 DIAGNOSIS — J301 Allergic rhinitis due to pollen: Secondary | ICD-10-CM | POA: Diagnosis not present

## 2021-06-26 NOTE — Progress Notes (Signed)
Patient ID: Judy Padilla, female    DOB: 1965/12/16, 55 y.o.   MRN: [id number]  No chief complaint on file.     ICD-10-CM   1. Neoplasm of uncertain behavior of lower outer quadrant of female breast, left  D48.62     2. Malignant neoplasm of lower-outer quadrant of left breast of female, estrogen receptor positive ([place])  C50.512    Z17.0     3. Acquired absence of left breast  Z90.12       History of Present Illness: Judy Padilla is a 55 y.o.  female  with a history of left breast cancer.  She presents for preoperative evaluation for upcoming procedure, removal of left breast tissue expander and placement of left breast implant and right breast mastopexy, scheduled for 07/09/2021 with Dr. Marla Roe.  The patient has not had problems with anesthesia. No history of DVT/PE.  No family history of DVT/PE.  No family or personal history of bleeding or clotting disorders.  Patient is not currently taking any blood thinners.  No history of CVA/MI.   Summary of Previous Visit: Patient has 400 cc in her 535 expander and does not want to go on a larger.  She would like to go with silicone implants.  She is not a smoker.  Job: Works from home for [employer] for: Asthma, hypertension, GERD.  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  She is currently undergoing Herceptin treatments.  Reports asthma has overall been well controlled, reports no recent need for rescue inhaler, has been receiving allergy injections with have been helpful.  Reports some recent allergy exacerbation related to being outdoors more often.  She has a Port-A-Cath in place.  She has not started on tamoxifen yet.  No recent changes in her health.  Past Medical History: Allergies: Allergies  Allergen Reactions   Shrimp Extract Allergy Skin Test Shortness Of Breath   Other Swelling    Cannot take Excedrin     Current Medications:  Current Outpatient  Medications:    albuterol (VENTOLIN HFA) 108 (90 Base) MCG/ACT inhaler, Inhale 1 puff into the lungs every 6 (six) hours as needed for shortness of breath., Disp: , Rfl:    amLODipine (NORVASC) 10 MG tablet, Take 10 mg by mouth every morning., Disp: , Rfl:    budesonide (PULMICORT) 0.5 MG/2ML nebulizer solution, Take 2 mLs by nebulization daily., Disp: , Rfl:    cetirizine (ZYRTEC) 10 MG tablet, Take 10 mg by mouth daily as needed for allergies., Disp: , Rfl:    Cholecalciferol 1.25 MG (50000 UT) capsule, Take 50,000 Units by mouth once a week., Disp: , Rfl:    EPINEPHrine 0.3 mg/0.3 mL IJ SOAJ injection, Inject 0.3 mg into the muscle as needed for anaphylaxis., Disp: , Rfl:    fluticasone (FLONASE) 50 MCG/ACT nasal spray, Place 1 spray into both nostrils daily as needed for allergies., Disp: , Rfl:    fluticasone furoate-vilanterol (BREO ELLIPTA) 100-25 MCG/INH AEPB, Inhale 1 puff into the lungs as needed for shortness of breath., Disp: , Rfl:    GNP BUDESONIDE NASAL SPRAY NA, Place 1 spray into the nose daily., Disp: , Rfl:    lidocaine-prilocaine (EMLA) cream, Apply 30 -45 mins prior to port access., Disp: 50 g, Rfl: 0   Multiple Vitamins-Minerals (CENTRUM ADULTS) TABS, Take 1 tablet by mouth daily., Disp: , Rfl:    naproxen sodium (ALEVE) 220 MG tablet, Take 220 mg by mouth  daily as needed (knee pain)., Disp: , Rfl:    NON FORMULARY, Takes weekly allergy shots, Disp: , Rfl:    ondansetron (ZOFRAN) 4 MG tablet, Take 1 tablet (4 mg total) by mouth every 8 (eight) hours as needed for nausea or vomiting. (Patient not taking: No sig reported), Disp: 20 tablet, Rfl: 0   ondansetron (ZOFRAN) 8 MG tablet, One pill every 8 hours as needed for nausea/vomitting. (Patient not taking: No sig reported), Disp: 40 tablet, Rfl: 1   pantoprazole (PROTONIX) 40 MG tablet, Take 40 mg by mouth every morning. (Patient not taking: No sig reported), Disp: , Rfl:    prochlorperazine (COMPAZINE) 10 MG tablet, Take 1  tablet (10 mg total) by mouth every 6 (six) hours as needed for nausea or vomiting. (Patient not taking: No sig reported), Disp: 40 tablet, Rfl: 1   rosuvastatin (CRESTOR) 40 MG tablet, Take 40 mg by mouth daily., Disp: , Rfl:   Past Medical Problems: Past Medical History:  Diagnosis Date   Asthma    well controlled   Breast cancer ([place])    Family history of adverse reaction to anesthesia    sister-hard time waking up   Family history of breast cancer    Family history of prostate cancer    Family history of uterine cancer    GERD (gastroesophageal reflux disease)    Hypertension     Past Surgical History: Past Surgical History:  Procedure Laterality Date   ABLATION     BREAST BIOPSY Left 2011   Benign per pt   BREAST BIOPSY Left 12/12/2020   3:30 3 cmfn, Q marker, path pending   BREAST BIOPSY Left 12/12/2020   3:30 1 cmfn, Vision marker, path pending   BREAST RECONSTRUCTION WITH PLACEMENT OF TISSUE EXPANDER AND FLEX HD (ACELLULAR HYDRATED DERMIS) Left 02/10/2021   Procedure: IMMEDIATE LEFT BREAST RECONSTRUCTION WITH PLACEMENT OF TISSUE EXPANDER AND FLEX HD (ACELLULAR HYDRATED DERMIS);  Surgeon: Wallace Going, DO;  Location: [facility];  Service: Plastics;  Laterality: Left;   COLONOSCOPY  01/15/2020   PORTACATH PLACEMENT Right 02/10/2021   Procedure: INSERTION PORT-A-CATH;  Surgeon: Ronny Bacon, MD;  Location: [facility];  Service: General;  Laterality: Right;   SIMPLE MASTECTOMY WITH AXILLARY SENTINEL NODE BIOPSY Left 02/10/2021   Procedure: SIMPLE MASTECTOMY WITH AXILLARY SENTINEL NODE BIOPSY;  Surgeon: Ronny Bacon, MD;  Location: [facility];  Service: General;  Laterality: Left;    Social History: Social History   Socioeconomic History   Marital status: Married    Spouse name: Not on file   Number of children: Not on file   Years of education: Not on file   Highest education level: Not on file  Occupational History   Not on file  Tobacco Use   Smoking status:  Never   Smokeless tobacco: Never  Vaping Use   Vaping Use: Never used  Substance and Sexual Activity   Alcohol use: Not Currently   Drug use: Never   Sexual activity: Not on file  Other Topics Concern   Not on file  Social History Narrative   Lives in [place] with husband; kids- college. Works for [employer]- working from home. No smoking or alcohol.    Social Determinants of Health   Financial Resource Strain: Not on file  Food Insecurity: Not on file  Transportation Needs: Not on file  Physical Activity: Not on file  Stress: Not on file  Social Connections: Not on file  Intimate Partner Violence: Not on file  Family History: Family History  Problem Relation Age of Onset   Hypertension Mother    Diabetes Mother    Hypertension Father    Diabetes Father    Cancer Father        prostate cancer-70s   Cancer Maternal Grandmother        uterine cancer   Breast cancer Sister        in in 22s.     Review of Systems: Review of Systems  Constitutional: Negative.   Respiratory: Negative.    Cardiovascular: Negative.   Gastrointestinal: Negative.   Neurological: Negative.    Physical Exam: Vital Signs LMP 01/04/2018   Physical Exam  Constitutional:      General: Not in acute distress.    Appearance: Normal appearance. Not ill-appearing.  HENT:     Head: Normocephalic and atraumatic.  Eyes:     Pupils: Pupils are equal, round Neck:     Musculoskeletal: Normal range of motion.  Cardiovascular:     Rate and Rhythm: Normal rate    Pulses: Normal pulses.  Pulmonary:     Effort: Pulmonary effort is normal. No respiratory distress.  Musculoskeletal: Normal range of motion.  Skin:    General: Skin is warm and dry.     Findings: No erythema or rash.  Neurological:     General: No focal deficit present.     Mental Status: Alert and oriented to person, place, and time. Mental status is at baseline.     Motor: No weakness.  Psychiatric:         Mood and Affect: Mood normal.        Behavior: Behavior normal.    Assessment/Plan: The patient is scheduled for exchange of left breast tissue expander for left breast silicone implant and right breast mastopexy with Dr. Marla Roe.  Risks, benefits, and alternatives of procedure discussed, questions answered and consent obtained.    Smoking Status: Non-smoker; Counseling Given? NA Last Mammogram: 12/23/2020; Results: No evidence of malignancy in the right breast  Caprini Score: 8, high; Risk Factors include: Age, BMI greater than 25, current Port-A-Cath in place, history of breast cancer and length of planned surgery. Recommendation for mechanical prophylaxis. Encourage early ambulation.  Given patient's overall functional status and health status, will likely hold off on postoperative pharmacological prophylaxis.  Will discuss with surgical team  Pictures obtained: '@consult'$   Post-op Rx sent to pharmacy: Zofran, Keflex, patient reports she does not need any narcotics for postop pain control.  Reports she has some from her previous breast surgery.  Patient was provided with the General Surgical Risk consent document and Pain Medication Agreement prior to their appointment.  They had adequate time to read through the risk consent documents and Pain Medication Agreement. We also discussed them in person together during this preop appointment. All of their questions were answered to their satisfaction.  Recommended calling if they have any further questions.  Risk consent form and Pain Medication Agreement to be scanned into patient's chart.  The risks that can be encountered with and after placement of a breast implant were discussed and include the following but not limited to these: bleeding, infection, delayed healing, anesthesia risks, skin sensation changes, injury to structures including nerves, blood vessels, and muscles which may be temporary or permanent, allergies to tape, suture  materials and glues, blood products, topical preparations or injected agents, skin contour irregularities, skin discoloration and swelling, deep vein thrombosis, cardiac and pulmonary complications, pain, which may persist, fluid accumulation, wrinkling  of the skin over the implanmt, changes in nipple or breast sensation, implant leakage or rupture, faulty position of the implant, persistent pain, formation of tight scar tissue around the implant (capsular contracture).  Patient was provided with the Mentor implant patient decision checklist and this was completed during today's preoperative evaluation. Patient had time to read through the information and any questions were answered to their content. Form will be scanned into patient's chart.  Electronically signed by: Carola Rhine Lelend Heinecke, PA-C 06/27/2021 7:44 AM

## 2021-06-26 NOTE — H&P (View-Only) (Signed)
Patient ID: Judy Padilla, female    DOB: 1965/12/10, 55 y.o.   MRN: [id number]  No chief complaint on file.     ICD-10-CM   1. Neoplasm of uncertain behavior of lower outer quadrant of female breast, left  D48.62     2. Malignant neoplasm of lower-outer quadrant of left breast of female, estrogen receptor positive ([place])  C50.512    Z17.0     3. Acquired absence of left breast  Z90.12       History of Present Illness: Judy Padilla is a 55 y.o.  female  with a history of left breast cancer.  She presents for preoperative evaluation for upcoming procedure, removal of left breast tissue expander and placement of left breast implant and right breast mastopexy, scheduled for 07/09/2021 with Dr. Marla Roe.  The patient has not had problems with anesthesia. No history of DVT/PE.  No family history of DVT/PE.  No family or personal history of bleeding or clotting disorders.  Patient is not currently taking any blood thinners.  No history of CVA/MI.   Summary of Previous Visit: Patient has 400 cc in her 535 expander and does not want to go on a larger.  She would like to go with silicone implants.  She is not a smoker.  Job: Works from home for [employer] for: Asthma, hypertension, GERD.  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  She is currently undergoing Herceptin treatments.  Reports asthma has overall been well controlled, reports no recent need for rescue inhaler, has been receiving allergy injections with have been helpful.  Reports some recent allergy exacerbation related to being outdoors more often.  She has a Port-A-Cath in place.  She has not started on tamoxifen yet.  No recent changes in her health.  Past Medical History: Allergies: Allergies  Allergen Reactions   Shrimp Extract Allergy Skin Test Shortness Of Breath   Other Swelling    Cannot take Excedrin     Current Medications:  Current Outpatient  Medications:    albuterol (VENTOLIN HFA) 108 (90 Base) MCG/ACT inhaler, Inhale 1 puff into the lungs every 6 (six) hours as needed for shortness of breath., Disp: , Rfl:    amLODipine (NORVASC) 10 MG tablet, Take 10 mg by mouth every morning., Disp: , Rfl:    budesonide (PULMICORT) 0.5 MG/2ML nebulizer solution, Take 2 mLs by nebulization daily., Disp: , Rfl:    cetirizine (ZYRTEC) 10 MG tablet, Take 10 mg by mouth daily as needed for allergies., Disp: , Rfl:    Cholecalciferol 1.25 MG (50000 UT) capsule, Take 50,000 Units by mouth once a week., Disp: , Rfl:    EPINEPHrine 0.3 mg/0.3 mL IJ SOAJ injection, Inject 0.3 mg into the muscle as needed for anaphylaxis., Disp: , Rfl:    fluticasone (FLONASE) 50 MCG/ACT nasal spray, Place 1 spray into both nostrils daily as needed for allergies., Disp: , Rfl:    fluticasone furoate-vilanterol (BREO ELLIPTA) 100-25 MCG/INH AEPB, Inhale 1 puff into the lungs as needed for shortness of breath., Disp: , Rfl:    GNP BUDESONIDE NASAL SPRAY NA, Place 1 spray into the nose daily., Disp: , Rfl:    lidocaine-prilocaine (EMLA) cream, Apply 30 -45 mins prior to port access., Disp: 50 g, Rfl: 0   Multiple Vitamins-Minerals (CENTRUM ADULTS) TABS, Take 1 tablet by mouth daily., Disp: , Rfl:    naproxen sodium (ALEVE) 220 MG tablet, Take 220 mg by mouth  daily as needed (knee pain)., Disp: , Rfl:    NON FORMULARY, Takes weekly allergy shots, Disp: , Rfl:    ondansetron (ZOFRAN) 4 MG tablet, Take 1 tablet (4 mg total) by mouth every 8 (eight) hours as needed for nausea or vomiting. (Patient not taking: No sig reported), Disp: 20 tablet, Rfl: 0   ondansetron (ZOFRAN) 8 MG tablet, One pill every 8 hours as needed for nausea/vomitting. (Patient not taking: No sig reported), Disp: 40 tablet, Rfl: 1   pantoprazole (PROTONIX) 40 MG tablet, Take 40 mg by mouth every morning. (Patient not taking: No sig reported), Disp: , Rfl:    prochlorperazine (COMPAZINE) 10 MG tablet, Take 1  tablet (10 mg total) by mouth every 6 (six) hours as needed for nausea or vomiting. (Patient not taking: No sig reported), Disp: 40 tablet, Rfl: 1   rosuvastatin (CRESTOR) 40 MG tablet, Take 40 mg by mouth daily., Disp: , Rfl:   Past Medical Problems: Past Medical History:  Diagnosis Date   Asthma    well controlled   Breast cancer ([place])    Family history of adverse reaction to anesthesia    sister-hard time waking up   Family history of breast cancer    Family history of prostate cancer    Family history of uterine cancer    GERD (gastroesophageal reflux disease)    Hypertension     Past Surgical History: Past Surgical History:  Procedure Laterality Date   ABLATION     BREAST BIOPSY Left 2011   Benign per pt   BREAST BIOPSY Left 12/12/2020   3:30 3 cmfn, Q marker, path pending   BREAST BIOPSY Left 12/12/2020   3:30 1 cmfn, Vision marker, path pending   BREAST RECONSTRUCTION WITH PLACEMENT OF TISSUE EXPANDER AND FLEX HD (ACELLULAR HYDRATED DERMIS) Left 02/10/2021   Procedure: IMMEDIATE LEFT BREAST RECONSTRUCTION WITH PLACEMENT OF TISSUE EXPANDER AND FLEX HD (ACELLULAR HYDRATED DERMIS);  Surgeon: Wallace Going, DO;  Location: [facility];  Service: Plastics;  Laterality: Left;   COLONOSCOPY  01/15/2020   PORTACATH PLACEMENT Right 02/10/2021   Procedure: INSERTION PORT-A-CATH;  Surgeon: Ronny Bacon, MD;  Location: [facility];  Service: General;  Laterality: Right;   SIMPLE MASTECTOMY WITH AXILLARY SENTINEL NODE BIOPSY Left 02/10/2021   Procedure: SIMPLE MASTECTOMY WITH AXILLARY SENTINEL NODE BIOPSY;  Surgeon: Ronny Bacon, MD;  Location: [facility];  Service: General;  Laterality: Left;    Social History: Social History   Socioeconomic History   Marital status: Married    Spouse name: Not on file   Number of children: Not on file   Years of education: Not on file   Highest education level: Not on file  Occupational History   Not on file  Tobacco Use   Smoking status:  Never   Smokeless tobacco: Never  Vaping Use   Vaping Use: Never used  Substance and Sexual Activity   Alcohol use: Not Currently   Drug use: Never   Sexual activity: Not on file  Other Topics Concern   Not on file  Social History Narrative   Lives in [place] with husband; kids- college. Works for [employer]- working from home. No smoking or alcohol.    Social Determinants of Health   Financial Resource Strain: Not on file  Food Insecurity: Not on file  Transportation Needs: Not on file  Physical Activity: Not on file  Stress: Not on file  Social Connections: Not on file  Intimate Partner Violence: Not on file  Family History: Family History  Problem Relation Age of Onset   Hypertension Mother    Diabetes Mother    Hypertension Father    Diabetes Father    Cancer Father        prostate cancer-70s   Cancer Maternal Grandmother        uterine cancer   Breast cancer Sister        in in 51s.     Review of Systems: Review of Systems  Constitutional: Negative.   Respiratory: Negative.    Cardiovascular: Negative.   Gastrointestinal: Negative.   Neurological: Negative.    Physical Exam: Vital Signs LMP 01/04/2018   Physical Exam  Constitutional:      General: Not in acute distress.    Appearance: Normal appearance. Not ill-appearing.  HENT:     Head: Normocephalic and atraumatic.  Eyes:     Pupils: Pupils are equal, round Neck:     Musculoskeletal: Normal range of motion.  Cardiovascular:     Rate and Rhythm: Normal rate    Pulses: Normal pulses.  Pulmonary:     Effort: Pulmonary effort is normal. No respiratory distress.  Musculoskeletal: Normal range of motion.  Skin:    General: Skin is warm and dry.     Findings: No erythema or rash.  Neurological:     General: No focal deficit present.     Mental Status: Alert and oriented to person, place, and time. Mental status is at baseline.     Motor: No weakness.  Psychiatric:         Mood and Affect: Mood normal.        Behavior: Behavior normal.    Assessment/Plan: The patient is scheduled for exchange of left breast tissue expander for left breast silicone implant and right breast mastopexy with Dr. Marla Roe.  Risks, benefits, and alternatives of procedure discussed, questions answered and consent obtained.    Smoking Status: Non-smoker; Counseling Given? NA Last Mammogram: 12/23/2020; Results: No evidence of malignancy in the right breast  Caprini Score: 8, high; Risk Factors include: Age, BMI greater than 25, current Port-A-Cath in place, history of breast cancer and length of planned surgery. Recommendation for mechanical prophylaxis. Encourage early ambulation.  Given patient's overall functional status and health status, will likely hold off on postoperative pharmacological prophylaxis.  Will discuss with surgical team  Pictures obtained: '@consult'$   Post-op Rx sent to pharmacy: Zofran, Keflex, patient reports she does not need any narcotics for postop pain control.  Reports she has some from her previous breast surgery.  Patient was provided with the General Surgical Risk consent document and Pain Medication Agreement prior to their appointment.  They had adequate time to read through the risk consent documents and Pain Medication Agreement. We also discussed them in person together during this preop appointment. All of their questions were answered to their satisfaction.  Recommended calling if they have any further questions.  Risk consent form and Pain Medication Agreement to be scanned into patient's chart.  The risks that can be encountered with and after placement of a breast implant were discussed and include the following but not limited to these: bleeding, infection, delayed healing, anesthesia risks, skin sensation changes, injury to structures including nerves, blood vessels, and muscles which may be temporary or permanent, allergies to tape, suture  materials and glues, blood products, topical preparations or injected agents, skin contour irregularities, skin discoloration and swelling, deep vein thrombosis, cardiac and pulmonary complications, pain, which may persist, fluid accumulation, wrinkling  of the skin over the implanmt, changes in nipple or breast sensation, implant leakage or rupture, faulty position of the implant, persistent pain, formation of tight scar tissue around the implant (capsular contracture).  Patient was provided with the [employer]or implant patient decision checklist and this was completed during today's preoperative evaluation. Patient had time to read through the information and any questions were answered to their content. Form will be scanned into patient's chart.  Electronically signed by: Carola Rhine Tarren Velardi, PA-C 06/27/2021 7:44 AM

## 2021-06-27 ENCOUNTER — Other Ambulatory Visit: Payer: Self-pay

## 2021-06-27 ENCOUNTER — Encounter: Payer: Self-pay | Admitting: Surgical

## 2021-06-27 ENCOUNTER — Ambulatory Visit (INDEPENDENT_AMBULATORY_CARE_PROVIDER_SITE_OTHER): Payer: No Typology Code available for payment source | Admitting: Surgical

## 2021-06-27 VITALS — BP 146/79 | HR 78 | Ht 68.0 in | Wt 195.0 lb

## 2021-06-27 DIAGNOSIS — Z170 Estrogen receptor positive status [ER+]: Secondary | ICD-10-CM

## 2021-06-27 DIAGNOSIS — C50512 Malignant neoplasm of lower-outer quadrant of left female breast: Secondary | ICD-10-CM

## 2021-06-27 DIAGNOSIS — D4862 Neoplasm of uncertain behavior of left breast: Secondary | ICD-10-CM

## 2021-06-27 DIAGNOSIS — Z9012 Acquired absence of left breast and nipple: Secondary | ICD-10-CM

## 2021-06-27 MED ORDER — CEPHALEXIN 500 MG PO CAPS: 500.0000 mg | ORAL_CAPSULE | Freq: Four times a day (QID) | ORAL | 0 refills | Status: AC

## 2021-06-27 MED ORDER — ONDANSETRON HCL 4 MG PO TABS: 4.0000 mg | ORAL_TABLET | Freq: Three times a day (TID) | ORAL | 0 refills | Status: DC | PRN

## 2021-06-30 ENCOUNTER — Telehealth: Payer: Self-pay

## 2021-06-30 ENCOUNTER — Ambulatory Visit
Admission: RE | Admit: 2021-06-30 | Discharge: 2021-06-30 | Disposition: A | Discharge status: Home / Self Care | Payer: No Typology Code available for payment source | Source: Ambulatory Visit | Attending: Internal Medicine | Admitting: Internal Medicine

## 2021-06-30 ENCOUNTER — Other Ambulatory Visit: Payer: Self-pay

## 2021-06-30 DIAGNOSIS — Z13820 Encounter for screening for osteoporosis: Secondary | ICD-10-CM | POA: Insufficient documentation

## 2021-06-30 DIAGNOSIS — Z780 Asymptomatic menopausal state: Secondary | ICD-10-CM | POA: Diagnosis not present

## 2021-06-30 NOTE — Telephone Encounter (Signed)
Patient called to say that her antibiotics for her surgery on 07/09/2021 have not been called in yet.  Please call.  *Patient's preferred pharmacy is the [facility] on [address] in [place].

## 2021-06-30 NOTE — Telephone Encounter (Signed)
Spoke to [facility] and pharm tech confirmed that antibiotic, cephalexin, Zofran and other med's were received and were ready for pt to p/u.   Called pt and confirmed that all med's x post SX were received by pharmacy and were ready for p/u. Pt stated that she didn't receive an e-mail that they were called in therefore she didn't think they were sent. Pt stated she would pick them up today.

## 2021-07-02 ENCOUNTER — Encounter (HOSPITAL_BASED_OUTPATIENT_CLINIC_OR_DEPARTMENT_OTHER): Payer: Self-pay | Admitting: Plastic Surgery

## 2021-07-02 ENCOUNTER — Other Ambulatory Visit: Payer: Self-pay

## 2021-07-02 DIAGNOSIS — J309 Allergic rhinitis, unspecified: Secondary | ICD-10-CM | POA: Diagnosis not present

## 2021-07-02 DIAGNOSIS — J45991 Cough variant asthma: Secondary | ICD-10-CM | POA: Diagnosis not present

## 2021-07-04 ENCOUNTER — Telehealth: Payer: Self-pay | Admitting: *Deleted

## 2021-07-04 DIAGNOSIS — K219 Gastro-esophageal reflux disease without esophagitis: Secondary | ICD-10-CM | POA: Diagnosis not present

## 2021-07-04 DIAGNOSIS — E782 Mixed hyperlipidemia: Secondary | ICD-10-CM | POA: Diagnosis not present

## 2021-07-04 DIAGNOSIS — R7302 Impaired glucose tolerance (oral): Secondary | ICD-10-CM | POA: Diagnosis not present

## 2021-07-04 DIAGNOSIS — I10 Essential (primary) hypertension: Secondary | ICD-10-CM | POA: Diagnosis not present

## 2021-07-04 NOTE — Telephone Encounter (Signed)
Per Cecille Rubin- RN - Patient is dropping off some [employer] paperwork she need corrected because there were 5 Mondays in May.

## 2021-07-07 ENCOUNTER — Encounter: Payer: Self-pay | Admitting: Internal Medicine

## 2021-07-08 ENCOUNTER — Telehealth: Payer: Self-pay

## 2021-07-08 DIAGNOSIS — K219 Gastro-esophageal reflux disease without esophagitis: Secondary | ICD-10-CM | POA: Diagnosis not present

## 2021-07-08 DIAGNOSIS — E782 Mixed hyperlipidemia: Secondary | ICD-10-CM | POA: Diagnosis not present

## 2021-07-08 DIAGNOSIS — I10 Essential (primary) hypertension: Secondary | ICD-10-CM | POA: Diagnosis not present

## 2021-07-08 DIAGNOSIS — R7302 Impaired glucose tolerance (oral): Secondary | ICD-10-CM | POA: Diagnosis not present

## 2021-07-08 NOTE — Telephone Encounter (Signed)
Pt is aware that her FMLA paperwork is completed and faxed over. Pt is requesting a copy of the paperwork to be mailed to her. Will mail to address on file.

## 2021-07-08 NOTE — Telephone Encounter (Signed)
Patient asking if her FMLA papers have been completed yet Please return her call [phone]   Fmla finished [facility] dept[facility] Belenda Cruise will you let pt know. Thanks.

## 2021-07-09 ENCOUNTER — Ambulatory Visit (HOSPITAL_BASED_OUTPATIENT_CLINIC_OR_DEPARTMENT_OTHER): Payer: No Typology Code available for payment source | Admitting: Certified Registered"

## 2021-07-09 ENCOUNTER — Ambulatory Visit (HOSPITAL_BASED_OUTPATIENT_CLINIC_OR_DEPARTMENT_OTHER)
Admission: RE | Admit: 2021-07-09 | Discharge: 2021-07-09 | Disposition: A | Discharge status: Home / Self Care | Payer: No Typology Code available for payment source | Attending: Plastic Surgery | Admitting: Plastic Surgery

## 2021-07-09 ENCOUNTER — Encounter (HOSPITAL_BASED_OUTPATIENT_CLINIC_OR_DEPARTMENT_OTHER): Payer: Self-pay | Admitting: Plastic Surgery

## 2021-07-09 ENCOUNTER — Other Ambulatory Visit: Payer: Self-pay

## 2021-07-09 ENCOUNTER — Encounter (HOSPITAL_BASED_OUTPATIENT_CLINIC_OR_DEPARTMENT_OTHER)
Admission: RE | Disposition: A | Discharge status: Home / Self Care | Payer: Self-pay | Source: Home / Self Care | Attending: Plastic Surgery

## 2021-07-09 DIAGNOSIS — K219 Gastro-esophageal reflux disease without esophagitis: Secondary | ICD-10-CM | POA: Insufficient documentation

## 2021-07-09 DIAGNOSIS — Z9012 Acquired absence of left breast and nipple: Secondary | ICD-10-CM | POA: Diagnosis not present

## 2021-07-09 DIAGNOSIS — Z421 Encounter for breast reconstruction following mastectomy: Secondary | ICD-10-CM | POA: Insufficient documentation

## 2021-07-09 DIAGNOSIS — Z853 Personal history of malignant neoplasm of breast: Secondary | ICD-10-CM | POA: Insufficient documentation

## 2021-07-09 DIAGNOSIS — Z886 Allergy status to analgesic agent status: Secondary | ICD-10-CM | POA: Diagnosis not present

## 2021-07-09 DIAGNOSIS — Z79899 Other long term (current) drug therapy: Secondary | ICD-10-CM | POA: Diagnosis not present

## 2021-07-09 DIAGNOSIS — C50512 Malignant neoplasm of lower-outer quadrant of left female breast: Secondary | ICD-10-CM | POA: Diagnosis not present

## 2021-07-09 DIAGNOSIS — J45909 Unspecified asthma, uncomplicated: Secondary | ICD-10-CM | POA: Insufficient documentation

## 2021-07-09 DIAGNOSIS — Z170 Estrogen receptor positive status [ER+]: Secondary | ICD-10-CM | POA: Diagnosis not present

## 2021-07-09 DIAGNOSIS — I10 Essential (primary) hypertension: Secondary | ICD-10-CM | POA: Diagnosis not present

## 2021-07-09 DIAGNOSIS — Z7951 Long term (current) use of inhaled steroids: Secondary | ICD-10-CM | POA: Diagnosis not present

## 2021-07-09 DIAGNOSIS — N651 Disproportion of reconstructed breast: Secondary | ICD-10-CM | POA: Diagnosis not present

## 2021-07-09 HISTORY — PX: REMOVAL OF TISSUE EXPANDER AND PLACEMENT OF IMPLANT: SHX6457

## 2021-07-09 SURGERY — REMOVAL, TISSUE EXPANDER, BREAST, WITH IMPLANT INSERTION
Anesthesia: General | Site: Breast | Laterality: Left

## 2021-07-09 MED ORDER — ACETAMINOPHEN 160 MG/5ML PO SOLN: 325.0000 mg | ORAL | Status: DC | PRN

## 2021-07-09 MED ORDER — CHLORHEXIDINE GLUCONATE CLOTH 2 % EX PADS: 6.0000 | MEDICATED_PAD | Freq: Once | CUTANEOUS | Status: DC

## 2021-07-09 MED ORDER — OXYCODONE HCL 5 MG/5ML PO SOLN: 5.0000 mg | Freq: Once | ORAL | Status: DC | PRN

## 2021-07-09 MED ORDER — PHENYLEPHRINE HCL (PRESSORS) 10 MG/ML IV SOLN
INTRAVENOUS | Status: DC | PRN
  Administered 2021-07-09 (×3): 80 ug via INTRAVENOUS

## 2021-07-09 MED ORDER — CEFAZOLIN SODIUM-DEXTROSE 2-4 GM/100ML-% IV SOLN
2.0000 g | INTRAVENOUS | Status: AC
  Administered 2021-07-09: 2 g via INTRAVENOUS

## 2021-07-09 MED ORDER — OXYCODONE HCL 5 MG PO TABS: 5.0000 mg | ORAL_TABLET | ORAL | Status: DC | PRN

## 2021-07-09 MED ORDER — ONDANSETRON HCL 4 MG/2ML IJ SOLN
INTRAMUSCULAR | Status: DC | PRN
  Administered 2021-07-09: 4 mg via INTRAVENOUS

## 2021-07-09 MED ORDER — ACETAMINOPHEN 325 MG RE SUPP: 650.0000 mg | RECTAL | Status: DC | PRN

## 2021-07-09 MED ORDER — SODIUM CHLORIDE 0.9 % IV SOLN: 250.0000 mL | INTRAVENOUS | Status: DC | PRN

## 2021-07-09 MED ORDER — KETOROLAC TROMETHAMINE 30 MG/ML IJ SOLN: 30.0000 mg | Freq: Once | INTRAMUSCULAR | Status: DC | PRN

## 2021-07-09 MED ORDER — ONDANSETRON HCL 4 MG/2ML IJ SOLN: 4.0000 mg | Freq: Once | INTRAMUSCULAR | Status: DC | PRN

## 2021-07-09 MED ORDER — FENTANYL CITRATE (PF) 100 MCG/2ML IJ SOLN
INTRAMUSCULAR | Status: DC | PRN
  Administered 2021-07-09: 50 ug via INTRAVENOUS
  Administered 2021-07-09: 25 ug via INTRAVENOUS

## 2021-07-09 MED ORDER — LIDOCAINE-EPINEPHRINE 1 %-1:100000 IJ SOLN
INTRAMUSCULAR | Status: DC | PRN
  Administered 2021-07-09: 5 mL

## 2021-07-09 MED ORDER — FENTANYL CITRATE (PF) 100 MCG/2ML IJ SOLN: 25.0000 ug | INTRAMUSCULAR | Status: DC | PRN

## 2021-07-09 MED ORDER — ACETAMINOPHEN 325 MG PO TABS: 650.0000 mg | ORAL_TABLET | ORAL | Status: DC | PRN

## 2021-07-09 MED ORDER — FENTANYL CITRATE (PF) 100 MCG/2ML IJ SOLN
INTRAMUSCULAR | Status: AC
  Filled 2021-07-09: qty 2

## 2021-07-09 MED ORDER — CEFAZOLIN SODIUM-DEXTROSE 2-4 GM/100ML-% IV SOLN
INTRAVENOUS | Status: AC
  Filled 2021-07-09: qty 100

## 2021-07-09 MED ORDER — LACTATED RINGERS IV SOLN: INTRAVENOUS | Status: DC

## 2021-07-09 MED ORDER — SODIUM CHLORIDE 0.9% FLUSH: 3.0000 mL | Freq: Two times a day (BID) | INTRAVENOUS | Status: DC

## 2021-07-09 MED ORDER — MEPERIDINE HCL 25 MG/ML IJ SOLN: 6.2500 mg | INTRAMUSCULAR | Status: DC | PRN

## 2021-07-09 MED ORDER — OXYCODONE HCL 5 MG PO TABS: 5.0000 mg | ORAL_TABLET | Freq: Once | ORAL | Status: DC | PRN

## 2021-07-09 MED ORDER — MIDAZOLAM HCL 5 MG/5ML IJ SOLN
INTRAMUSCULAR | Status: DC | PRN
Start: 2021-07-09 — End: 2021-07-09
  Administered 2021-07-09: 2 mg via INTRAVENOUS

## 2021-07-09 MED ORDER — MIDAZOLAM HCL 2 MG/2ML IJ SOLN
INTRAMUSCULAR | Status: AC
  Filled 2021-07-09: qty 2

## 2021-07-09 MED ORDER — DEXAMETHASONE SODIUM PHOSPHATE 4 MG/ML IJ SOLN
INTRAMUSCULAR | Status: DC | PRN
Start: 2021-07-09 — End: 2021-07-09
  Administered 2021-07-09: 4 mg via INTRAVENOUS

## 2021-07-09 MED ORDER — LIDOCAINE HCL (CARDIAC) PF 100 MG/5ML IV SOSY
PREFILLED_SYRINGE | INTRAVENOUS | Status: DC | PRN
  Administered 2021-07-09: 60 mg via INTRAVENOUS

## 2021-07-09 MED ORDER — SODIUM CHLORIDE 0.9% FLUSH: 3.0000 mL | INTRAVENOUS | Status: DC | PRN

## 2021-07-09 MED ORDER — PROPOFOL 10 MG/ML IV BOLUS
INTRAVENOUS | Status: DC | PRN
  Administered 2021-07-09: 150 mg via INTRAVENOUS

## 2021-07-09 MED ORDER — ACETAMINOPHEN 325 MG PO TABS: 325.0000 mg | ORAL_TABLET | ORAL | Status: DC | PRN

## 2021-07-09 MED ORDER — DEXMEDETOMIDINE (PRECEDEX) IN NS 20 MCG/5ML (4 MCG/ML) IV SYRINGE
PREFILLED_SYRINGE | INTRAVENOUS | Status: DC | PRN
  Administered 2021-07-09: 8 ug via INTRAVENOUS

## 2021-07-09 SURGICAL SUPPLY — 66 items
ADH SKN CLS APL DERMABOND .7 (GAUZE/BANDAGES/DRESSINGS) ×1
BAG DECANTER FOR FLEXI CONT (MISCELLANEOUS) ×2 IMPLANT
BINDER BREAST XLRG (GAUZE/BANDAGES/DRESSINGS) ×2 IMPLANT
BIOPATCH RED 1 DISK 7.0 (GAUZE/BANDAGES/DRESSINGS) IMPLANT
BLADE SURG 15 STRL LF DISP TIS (BLADE) ×1 IMPLANT
BLADE SURG 15 STRL SS (BLADE) ×2
CANISTER SUCT 1200ML W/VALVE (MISCELLANEOUS) ×2 IMPLANT
COVER BACK TABLE 60X90IN (DRAPES) ×2 IMPLANT
COVER MAYO STAND STRL (DRAPES) ×2 IMPLANT
DECANTER SPIKE VIAL GLASS SM (MISCELLANEOUS) IMPLANT
DERMABOND ADVANCED (GAUZE/BANDAGES/DRESSINGS) ×1
DERMABOND ADVANCED .7 DNX12 (GAUZE/BANDAGES/DRESSINGS) ×1 IMPLANT
DRAIN CHANNEL 19F RND (DRAIN) IMPLANT
DRAPE LAPAROSCOPIC ABDOMINAL (DRAPES) ×2 IMPLANT
DRSG OPSITE POSTOP 4X6 (GAUZE/BANDAGES/DRESSINGS) ×2 IMPLANT
DRSG PAD ABDOMINAL 8X10 ST (GAUZE/BANDAGES/DRESSINGS) ×4 IMPLANT
ELECT BLADE 4.0 EZ CLEAN MEGAD (MISCELLANEOUS) ×2
ELECT COATED BLADE 2.86 ST (ELECTRODE) ×2 IMPLANT
ELECT REM PT RETURN 9FT ADLT (ELECTROSURGICAL) ×2
ELECTRODE BLDE 4.0 EZ CLN MEGD (MISCELLANEOUS) ×1 IMPLANT
ELECTRODE REM PT RTRN 9FT ADLT (ELECTROSURGICAL) ×1 IMPLANT
EVACUATOR SILICONE 100CC (DRAIN) IMPLANT
FUNNEL KELLER 2 DISP (MISCELLANEOUS) ×2 IMPLANT
GAUZE SPONGE 4X4 12PLY STRL LF (GAUZE/BANDAGES/DRESSINGS) IMPLANT
GLOVE SURG ENC MOIS LTX SZ6 (GLOVE) ×6 IMPLANT
GLOVE SURG POLYISO LF SZ8 (GLOVE) ×2 IMPLANT
GLOVE SURG UNDER POLY LF SZ6.5 (GLOVE) ×4 IMPLANT
GOWN STRL REUS W/ TWL LRG LVL3 (GOWN DISPOSABLE) ×3 IMPLANT
GOWN STRL REUS W/TWL 2XL LVL3 (GOWN DISPOSABLE) ×2 IMPLANT
GOWN STRL REUS W/TWL LRG LVL3 (GOWN DISPOSABLE) ×6
IMPL BREAST SMOOTH UH 430CC (Breast) ×1 IMPLANT
IMPLANT BREAST SMOOTH UH 430CC (Breast) ×2 IMPLANT
IV NS 1000ML (IV SOLUTION)
IV NS 1000ML BAXH (IV SOLUTION) IMPLANT
NDL SAFETY ECLIPSE 18X1.5 (NEEDLE) ×1 IMPLANT
NEEDLE HYPO 18GX1.5 SHARP (NEEDLE) ×2
NEEDLE HYPO 25X1 1.5 SAFETY (NEEDLE) ×2 IMPLANT
PACK BASIN DAY SURGERY FS (CUSTOM PROCEDURE TRAY) ×2 IMPLANT
PENCIL SMOKE EVACUATOR (MISCELLANEOUS) ×2 IMPLANT
PIN SAFETY STERILE (MISCELLANEOUS) IMPLANT
SIZER BREAST GEL REUSE 430CC (SIZER) ×2
SIZER BREAST REUSE 415CC (SIZER) ×2
SIZER BRST GEL REUSE 430CC (SIZER) ×1 IMPLANT
SIZER BRST REUSE P5.8XHI 415CC (SIZER) ×1 IMPLANT
SLEEVE SCD COMPRESS KNEE MED (STOCKING) ×2 IMPLANT
SPONGE T-LAP 18X18 ~~LOC~~+RFID (SPONGE) ×6 IMPLANT
STRIP SUTURE WOUND CLOSURE 1/2 (MISCELLANEOUS) ×2 IMPLANT
SUT MNCRL AB 3-0 PS2 18 (SUTURE) IMPLANT
SUT MNCRL AB 4-0 PS2 18 (SUTURE) ×4 IMPLANT
SUT MON AB 3-0 SH 27 (SUTURE) ×4
SUT MON AB 3-0 SH27 (SUTURE) ×2 IMPLANT
SUT MON AB 5-0 PS2 18 (SUTURE) IMPLANT
SUT PDS 3-0 CT2 (SUTURE)
SUT PDS AB 2-0 CT2 27 (SUTURE) ×4 IMPLANT
SUT PDS II 3-0 CT2 27 ABS (SUTURE) IMPLANT
SUT SILK 3 0 PS 1 (SUTURE) IMPLANT
SUT VIC AB 3-0 SH 27 (SUTURE)
SUT VIC AB 3-0 SH 27X BRD (SUTURE) IMPLANT
SUT VICRYL 4-0 PS2 18IN ABS (SUTURE) IMPLANT
SYR BULB IRRIG 60ML STRL (SYRINGE) ×2 IMPLANT
SYR CONTROL 10ML LL (SYRINGE) ×2 IMPLANT
TOWEL GREEN STERILE FF (TOWEL DISPOSABLE) ×4 IMPLANT
TRAY DSU PREP LF (CUSTOM PROCEDURE TRAY) ×4 IMPLANT
TUBE CONNECTING 20X1/4 (TUBING) ×2 IMPLANT
UNDERPAD 30X36 HEAVY ABSORB (UNDERPADS AND DIAPERS) ×4 IMPLANT
YANKAUER SUCT BULB TIP NO VENT (SUCTIONS) ×2 IMPLANT

## 2021-07-09 NOTE — Transfer of Care (Signed)
Immediate Anesthesia Transfer of Care Note  Patient: Judy Padilla  Procedure(s) Performed: REMOVAL OF TISSUE EXPANDER AND PLACEMENT OF IMPLANT LEFT BREAST (Left: Breast)  Patient Location: PACU  Anesthesia Type:General  Level of Consciousness: drowsy, patient cooperative and responds to stimulation  Airway & Oxygen Therapy: Patient Spontanous Breathing and Patient connected to face mask oxygen  Post-op Assessment: Report given to RN and Post -op Vital signs reviewed and stable  Post vital signs: Reviewed and stable  Last Vitals:  Vitals Value Taken Time  BP 125/82 07/09/21 1423  Temp 36.5 C 07/09/21 1424  Pulse 90 07/09/21 1424  Resp 22 07/09/21 1424  SpO2 97 % 07/09/21 1424  Vitals shown include unvalidated device data.  Last Pain:  Vitals:   07/09/21 1114  TempSrc: Oral  PainSc: 0-No pain      Patients Stated Pain Goal: 0 ([id number])  Complications: No notable events documented.

## 2021-07-09 NOTE — Anesthesia Postprocedure Evaluation (Signed)
Anesthesia Post Note  Patient: Judy Padilla  Procedure(s) Performed: REMOVAL OF TISSUE EXPANDER AND PLACEMENT OF IMPLANT LEFT BREAST (Left: Breast)     Patient location during evaluation: PACU Anesthesia Type: General Level of consciousness: awake and sedated Pain management: pain level controlled Vital Signs Assessment: post-procedure vital signs reviewed and stable Respiratory status: spontaneous breathing Cardiovascular status: stable Postop Assessment: no apparent nausea or vomiting Anesthetic complications: no   No notable events documented.  Last Vitals:  Vitals:   07/09/21 1430 07/09/21 1445  BP: 119/81 131/89  Pulse: 78 69  Resp: 15 13  Temp:    SpO2: 98% 94%    Last Pain:  Vitals:   07/09/21 1445  TempSrc:   PainSc: 0-No pain                 Huston Foley

## 2021-07-09 NOTE — Interval H&P Note (Signed)
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  The various methods of treatment have been discussed with the patient and family. After consideration of risks, benefits and other options for treatment, the patient has consented to  Procedure(s): REMOVAL OF TISSUE EXPANDER AND PLACEMENT OF IMPLANT LEFT BREAST (Left) as a surgical intervention.  The patient's history has been reviewed, patient examined, no change in status, stable for surgery.  I have reviewed the patient's chart and labs.  Questions were answered to the patient's satisfaction.     Judy Padilla Judy Padilla

## 2021-07-09 NOTE — Anesthesia Procedure Notes (Signed)
Procedure Name: LMA Insertion Date/Time: 07/09/2021 1:23 PM Performed by: Signe Colt, CRNA Pre-anesthesia Checklist: Patient identified, Emergency Drugs available, Suction available and Patient being monitored Patient Re-evaluated:Patient Re-evaluated prior to induction Oxygen Delivery Method: Circle System Utilized Preoxygenation: Pre-oxygenation with 100% oxygen Induction Type: IV induction Ventilation: Mask ventilation without difficulty LMA: LMA inserted LMA Size: 4.0 Number of attempts: 1 Airway Equipment and Method: bite block Placement Confirmation: positive ETCO2 Tube secured with: Tape Dental Injury: Teeth and Oropharynx as per pre-operative assessment

## 2021-07-09 NOTE — Discharge Instructions (Addendum)
INSTRUCTIONS FOR AFTER SURGERY   You will likely have some questions about what to expect following your operation.  The following information will help you and your family understand what to expect when you are discharged from the hospital.  Following these guidelines will help ensure a smooth recovery and reduce risks of complications.  Postoperative instructions include information on: diet, wound care, medications and physical activity.  AFTER SURGERY Expect to go home after the procedure.  In some cases, you may need to spend one night in the hospital for observation.  DIET This surgery does not require a specific diet.  However, I have to mention that the healthier you eat the better your body can start healing. It is important to increasing your protein intake.  This means limiting the foods with added sugar.  Focus on fruits and vegetables and some meat. It is very important to drink water after your surgery.  If your urine is bright yellow, then it is concentrated, and you need to drink more water.  As a general rule after surgery, you should have 8 ounces of water every hour while awake.  If you find you are persistently nauseated or unable to take in liquids let us know.  NO TOBACCO USE or EXPOSURE.  This will slow your healing process and increase the risk of a wound.  WOUND CARE If you don't have a drain: You can shower the day after surgery.  Use fragrance free soap.  Dial, [place], [place] and Cetaphil are usually mild on the skin.  If you have steri-strips / tape directly attached to your skin leave them in place. It is OK to get these wet.  No baths, pools or hot tubs for two weeks. We close your incision to leave the smallest and best-looking scar. No ointment or creams on your incisions until given the go ahead.  Especially not Neosporin (Too many skin reactions with this one).  A few weeks after surgery you can use Mederma and start massaging the scar. We ask you to wear your binder or  sports bra for the first 6 weeks around the clock, including while sleeping. This provides added comfort and helps reduce the fluid accumulation at the surgery site.  ACTIVITY No heavy lifting until cleared by the doctor.  It is OK to walk and climb stairs. In fact, moving your legs is very important to decrease your risk of a blood clot.  It will also help keep you from getting deconditioned.  Every 1 to 2 hours get up and walk for 5 minutes. This will help with a quicker recovery back to normal.  Let pain be your guide so you don't do too much.  NO, you cannot do the spring cleaning and don't plan on taking care of anyone else.  This is your time for TLC.   WORK Everyone returns to work at different times. As a rough guide, most people take at least 1 - 2 weeks off prior to returning to work. If you need documentation for your job, bring the forms to your postoperative follow up visit.  DRIVING Arrange for someone to bring you home from the hospital.  You may be able to drive a few days after surgery but not while taking any narcotics or valium.  BOWEL MOVEMENTS Constipation can occur after anesthesia and while taking pain medication.  It is important to stay ahead for your comfort.  We recommend taking Milk of Magnesia (2 tablespoons; twice a day) while taking  the pain pills.  SEROMA This is fluid your body tried to put in the surgical site.  This is normal but if it creates excessive pain and swelling let us know.  It usually decreases in a few weeks.  MEDICATIONS and PAIN CONTROL At your preoperative visit for you history and physical you were given the following medications: An antibiotic: Start this medication when you get home and take according to the instructions on the bottle. Zofran 4 mg:  This is to treat nausea and vomiting.  You can take this every 6 hours as needed and only if needed. Norco (hydrocodone/acetaminophen) 5/325 mg:  This is only to be used after you have taken the  motrin or the tylenol. Every 8 hours as needed. Over the counter Medication to take: Ibuprofen (Motrin) 600 mg:  Take this every 6 hours.  If you have additional pain then take 500 mg of the tylenol.  Only take the Norco after you have tried these two. Miralax or stool softener of choice: Take this according to the bottle if you take the [place] Call your surgeon's office if any of the following occur:  Fever 101 degrees F or greater  Excessive bleeding or fluid from the incision site.  Pain that increases over time without aid from the medications  Redness, warmth, or pus draining from incision sites  Persistent nausea or inability to take in liquids  Severe misshapen area that underwent the operation.      Post Anesthesia Home Care Instructions  Activity: Get plenty of rest for the remainder of the day. A responsible individual must stay with you for 24 hours following the procedure.  For the next 24 hours, DO NOT: -Drive a car -Paediatric nurse -Drink alcoholic beverages -Take any medication unless instructed by your physician -Make any legal decisions or sign important papers.  Meals: Start with liquid foods such as gelatin or soup. Progress to regular foods as tolerated. Avoid greasy, spicy, heavy foods. If nausea and/or vomiting occur, drink only clear liquids until the nausea and/or vomiting subsides. Call your physician if vomiting continues.  Special Instructions/Symptoms: Your throat may feel dry or sore from the anesthesia or the breathing tube placed in your throat during surgery. If this causes discomfort, gargle with warm salt water. The discomfort should disappear within 24 hours.  If you had a scopolamine patch placed behind your ear for the management of post- operative nausea and/or vomiting:  1. The medication in the patch is effective for 72 hours, after which it should be removed.  Wrap patch in a tissue and discard in the trash. Wash hands  thoroughly with soap and water. 2. You may remove the patch earlier than 72 hours if you experience unpleasant side effects which may include dry mouth, dizziness or visual disturbances. 3. Avoid touching the patch. Wash your hands with soap and water after contact with the patch.

## 2021-07-09 NOTE — Anesthesia Preprocedure Evaluation (Signed)
Anesthesia Evaluation    History of Anesthesia Complications (+) Family history of anesthesia reaction  Airway Mallampati: I       Dental no notable dental hx. (+) Teeth Intact   Pulmonary asthma ,    Pulmonary exam normal        Cardiovascular hypertension, Normal cardiovascular exam     Neuro/Psych negative neurological ROS  negative psych ROS   GI/Hepatic Neg liver ROS, GERD  Medicated and Controlled,  Endo/Other  negative endocrine ROS  Renal/GU negative Renal ROS  negative genitourinary   Musculoskeletal negative musculoskeletal ROS (+)   Abdominal Normal abdominal exam  (+)   Peds  Hematology   Anesthesia Other Findings   Reproductive/Obstetrics                             Anesthesia Physical Anesthesia Plan  ASA: 2  Anesthesia Plan: General   Post-op Pain Management:    Induction: Intravenous  PONV Risk Score and Plan: 4 or greater and Ondansetron, Dexamethasone and Midazolam  Airway Management Planned: LMA  Additional Equipment: None  Intra-op Plan:   Post-operative Plan: Extubation in OR  Informed Consent: I have reviewed the patients History and Physical, chart, labs and discussed the procedure including the risks, benefits and alternatives for the proposed anesthesia with the patient or authorized representative who has indicated his/her understanding and acceptance.     Dental advisory given  Plan Discussed with: CRNA  Anesthesia Plan Comments:         Anesthesia Quick Evaluation

## 2021-07-09 NOTE — Op Note (Signed)
Op report Bilateral Exchange   DATE OF OPERATION: 07/09/2021  LOCATION: [place]  SURGICAL DIVISION: Plastic Surgery  PREOPERATIVE DIAGNOSIS:  History of left breast cancer.  2.  Acquired absence of left breast.   POSTOPERATIVE DIAGNOSIS:  1. History of left breast cancer.  2. Acquired absence of left breast.   PROCEDURE:  1. Exchange of left breast tissue expander for implant.  2. Left breast capsulotomies for implant respositioning.  SURGEON: Lyndal Reggio Sanger Ailanie Ruttan, DO  ASSISTANT: Bobbye Charleston, PA  ANESTHESIA:  General.   COMPLICATIONS: None.   IMPLANTS: Left - [place] Smooth Round Ultra High Profile Gel 430 cc. Ref [id number] .  Serial Number [id number]  INDICATIONS FOR PROCEDURE:  The patient, Judy Padilla, is a 55 y.o. female born on 1966-07-08, is here for treatment after left mastectomy.  She had a tissue expander placed at the time of her mastectomy. She now presents for exchange of her expander for an implant.  She requires capsulotomies to better position the implant. MRN: [id number]  CONSENT:  Informed consent was obtained directly from the patient. Risks, benefits and alternatives were fully discussed. Specific risks including but not limited to bleeding, infection, hematoma, seroma, scarring, pain, implant infection, implant extrusion, capsular contracture, asymmetry, wound healing problems, and need for further surgery were all discussed. The patient did have an ample opportunity to have her questions answered to her satisfaction.   DESCRIPTION OF PROCEDURE:  The patient was taken to the operating room. SCDs were placed and IV antibiotics were given. The patient's chest was prepped and draped in a sterile fashion. A time out was performed and the implants to be used were identified.    Left breast: The old mastectomy scar was excised.  The mastectomy flaps from the superior and inferior flaps were raised over the pectoralis major  muscle for several centimeters to minimize tension for the closure. The pectoralis was split inferior to the skin incision to expose and remove the tissue expander.  Inspection of the pocket showed a normal healthy capsule and good integration of the biologic matrix.   Circumferential capsulotomies were performed to allow for breast pocket expansion.  Measurements were made and a sizer utilized to confirm adequate pocket size for the implant dimensions.  Hemostasis was ensured with the electrocautery.  New gloves were applied. The implant was soaked in antibiotic solution and placed in the pocket and oriented appropriately. The pectoralis major muscle and capsule on the anterior surface were re-closed with a 3-0 PDS suture. The remaining skin was closed with 3-0 Monocryl deep dermal and 4-0 Monocryl subcuticular stitches.  Dermabond was applied to the incision site. A breast binder and ABDs were placed.  The patient was allowed to wake from anesthesia and taken to the recovery room in satisfactory condition.   The advanced practice practitioner (APP) assisted throughout the case.  The APP was essential in retraction and counter traction when needed to make the case progress smoothly.  This retraction and assistance made it possible to see the tissue plans for the procedure.  The assistance was needed for blood control, tissue re-approximation and assisted with closure of the incision site.

## 2021-07-10 ENCOUNTER — Encounter (HOSPITAL_BASED_OUTPATIENT_CLINIC_OR_DEPARTMENT_OTHER): Payer: Self-pay | Admitting: Plastic Surgery

## 2021-07-16 ENCOUNTER — Inpatient Hospital Stay: Payer: No Typology Code available for payment source | Attending: Internal Medicine

## 2021-07-16 ENCOUNTER — Other Ambulatory Visit: Payer: Self-pay

## 2021-07-16 ENCOUNTER — Inpatient Hospital Stay (HOSPITAL_BASED_OUTPATIENT_CLINIC_OR_DEPARTMENT_OTHER): Payer: No Typology Code available for payment source | Admitting: Internal Medicine

## 2021-07-16 ENCOUNTER — Inpatient Hospital Stay: Payer: No Typology Code available for payment source

## 2021-07-16 DIAGNOSIS — C50512 Malignant neoplasm of lower-outer quadrant of left female breast: Secondary | ICD-10-CM | POA: Insufficient documentation

## 2021-07-16 DIAGNOSIS — I10 Essential (primary) hypertension: Secondary | ICD-10-CM | POA: Diagnosis not present

## 2021-07-16 DIAGNOSIS — Z170 Estrogen receptor positive status [ER+]: Secondary | ICD-10-CM | POA: Insufficient documentation

## 2021-07-16 DIAGNOSIS — J45909 Unspecified asthma, uncomplicated: Secondary | ICD-10-CM | POA: Diagnosis not present

## 2021-07-16 DIAGNOSIS — Z5112 Encounter for antineoplastic immunotherapy: Secondary | ICD-10-CM | POA: Insufficient documentation

## 2021-07-16 LAB — COMPREHENSIVE METABOLIC PANEL
ALT: 26 U/L (ref 0–44)
AST: 20 U/L (ref 15–41)
Albumin: 4 g/dL (ref 3.5–5.0)
Alkaline Phosphatase: 78 U/L (ref 38–126)
Anion gap: 5 (ref 5–15)
BUN: 15 mg/dL (ref 6–20)
CO2: 25 mmol/L (ref 22–32)
Calcium: 8.7 mg/dL — ABNORMAL LOW (ref 8.9–10.3)
Chloride: 106 mmol/L (ref 98–111)
Creatinine, Ser: 0.83 mg/dL (ref 0.44–1.00)
GFR, Estimated: >60 mL/min (ref >60)
Glucose, Bld: 106 mg/dL — ABNORMAL HIGH (ref 70–99)
Potassium: 4 mmol/L (ref 3.5–5.1)
Sodium: 136 mmol/L (ref 135–145)
Total Bilirubin: 0.5 mg/dL (ref 0.3–1.2)
Total Protein: 6.9 g/dL (ref 6.5–8.1)

## 2021-07-16 LAB — CBC WITH DIFFERENTIAL/PLATELET
Abs Immature Granulocytes: 0.03 10*3/uL (ref 0.00–0.07)
Basophils Absolute: 0 10*3/uL (ref 0.0–0.1)
Basophils Relative: 0 %
Eosinophils Absolute: 0.4 10*3/uL (ref 0.0–0.5)
Eosinophils Relative: 7 %
HCT: 35.8 % — ABNORMAL LOW (ref 36.0–46.0)
Hemoglobin: 11.6 g/dL — ABNORMAL LOW (ref 12.0–15.0)
Immature Granulocytes: 1 %
Lymphocytes Relative: 34 %
Lymphs Abs: 1.7 10*3/uL (ref 0.7–4.0)
MCH: 26.7 pg (ref 26.0–34.0)
MCHC: 32.4 g/dL (ref 30.0–36.0)
MCV: 82.5 fL (ref 80.0–100.0)
Monocytes Absolute: 0.6 10*3/uL (ref 0.1–1.0)
Monocytes Relative: 13 %
Neutro Abs: 2.1 10*3/uL (ref 1.7–7.7)
Neutrophils Relative %: 45 %
Platelets: 280 10*3/uL (ref 150–400)
RBC: 4.34 MIL/uL (ref 3.87–5.11)
RDW: 15.9 % — ABNORMAL HIGH (ref 11.5–15.5)
WBC: 4.8 10*3/uL (ref 4.0–10.5)
nRBC: 0 % (ref 0.0–0.2)

## 2021-07-16 MED ORDER — HEPARIN SOD (PORK) LOCK FLUSH 100 UNIT/ML IV SOLN
500.0000 [IU] | Freq: Once | INTRAVENOUS | Status: AC | PRN
  Filled 2021-07-16: qty 5

## 2021-07-16 MED ORDER — ACETAMINOPHEN 325 MG PO TABS
650.0000 mg | ORAL_TABLET | Freq: Once | ORAL | Status: AC
  Administered 2021-07-16: 650 mg via ORAL
  Filled 2021-07-16: qty 2

## 2021-07-16 MED ORDER — TRASTUZUMAB-ANNS CHEMO 150 MG IV SOLR
6.0000 mg/kg | Freq: Once | INTRAVENOUS | Status: AC
  Administered 2021-07-16: 525 mg via INTRAVENOUS
  Filled 2021-07-16: qty 25

## 2021-07-16 MED ORDER — DIPHENHYDRAMINE HCL 25 MG PO CAPS
25.0000 mg | ORAL_CAPSULE | Freq: Once | ORAL | Status: AC
  Administered 2021-07-16: 25 mg via ORAL
  Filled 2021-07-16: qty 1

## 2021-07-16 MED ORDER — SODIUM CHLORIDE 0.9% FLUSH
10.0000 mL | INTRAVENOUS | Status: DC | PRN
  Administered 2021-07-16: 10 mL
  Filled 2021-07-16: qty 10

## 2021-07-16 MED ORDER — SODIUM CHLORIDE 0.9% FLUSH
10.0000 mL | Freq: Once | INTRAVENOUS | Status: AC
  Administered 2021-07-16: 10 mL via INTRAVENOUS
  Filled 2021-07-16: qty 10

## 2021-07-16 MED ORDER — SODIUM CHLORIDE 0.9 % IV SOLN
Freq: Once | INTRAVENOUS | Status: AC
  Filled 2021-07-16: qty 250

## 2021-07-16 MED ORDER — HEPARIN SOD (PORK) LOCK FLUSH 100 UNIT/ML IV SOLN
INTRAVENOUS | Status: AC
  Administered 2021-07-16: 500 [IU]
  Filled 2021-07-16: qty 5

## 2021-07-16 NOTE — Patient Instructions (Signed)
[facility]   Discharge Instructions: Thank you for choosing [facility] to provide your oncology and hematology care.  If you have a lab appointment with the [facility], please go directly to the [facility] and check in at the registration area.  Wear comfortable clothing and clothing appropriate for easy access to any Portacath or PICC line.   We strive to give you quality time with your provider. You may need to reschedule your appointment if you arrive late (15 or more minutes).  Arriving late affects you and other patients whose appointments are after yours.  Also, if you miss three or more appointments without notifying the office, you may be dismissed from the clinic at the provider's discretion.      For prescription refill requests, have your pharmacy contact our office and allow 72 hours for refills to be completed.    Today you received the following chemotherapy and/or immunotherapy agents: Kanjinti.      To help prevent nausea and vomiting after your treatment, we encourage you to take your nausea medication as directed.  BELOW ARE SYMPTOMS THAT SHOULD BE REPORTED IMMEDIATELY: *FEVER GREATER THAN 100.4 F (38 C) OR HIGHER *CHILLS OR SWEATING *NAUSEA AND VOMITING THAT IS NOT CONTROLLED WITH YOUR NAUSEA MEDICATION *UNUSUAL SHORTNESS OF BREATH *UNUSUAL BRUISING OR BLEEDING *URINARY PROBLEMS (pain or burning when urinating, or frequent urination) *BOWEL PROBLEMS (unusual diarrhea, constipation, pain near the anus) TENDERNESS IN MOUTH AND THROAT WITH OR WITHOUT PRESENCE OF ULCERS (sore throat, sores in mouth, or a toothache) UNUSUAL RASH, SWELLING OR PAIN  UNUSUAL VAGINAL DISCHARGE OR ITCHING   Items with * indicate a potential emergency and should be followed up as soon as possible or go to the Emergency Department if any problems should occur.  Please show the CHEMOTHERAPY ALERT CARD or IMMUNOTHERAPY ALERT CARD at  check-in to the Emergency Department and triage nurse.  Should you have questions after your visit or need to cancel or reschedule your appointment, please contact [facility]  [phone] and follow the prompts.  Office hours are 8:00 a.m. to 4:30 p.m. Monday - Friday. Please note that voicemails left after 4:00 p.m. may not be returned until the following business day.  We are closed weekends and major holidays. You have access to a nurse at all times for urgent questions. Please call the main number to the clinic [phone] and follow the prompts.  For any non-urgent questions, you may also contact your provider using MyChart. We now offer e-Visits for anyone 58 and older to request care online for non-urgent symptoms. For details visit [email or web address].   Also download the MyChart app! Go to the app store, search "MyChart", open the app, select [facility], and log in with your MyChart username and password.  Due to Covid, a mask is required upon entering the hospital/clinic. If you do not have a mask, one will be given to you upon arrival. For doctor visits, patients may have 1 support person aged 39 or older with them. For treatment visits, patients cannot have anyone with them due to current Covid guidelines and our immunocompromised population.

## 2021-07-16 NOTE — Assessment & Plan Note (Addendum)
#  Left breast- CA- s/p mastectomy-  pT1c pN0 [Stage IA]Grade 3.  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  On adjuvant Herceptin. STABLE.   #Proceed with cycle #5 herceptin q3 W. Labs today reviewed;  acceptable for treatment today.  AUG 2022- LVEF- 56.8 %.Comparable to LVEF equals 63.8% on 03/05/2021;  MUGA scan every 3 to 4 months.   # Discussed re: Anti-hormone-tamoxifen versus AI-based on postmenopausal status- discssed; awaiting hormones-; plan script next viist.  Reviewed BMD- AUG 2022- T score= 0.3.   # Bil hands- dorsum hyperpigmentation continue urea cream 40%+ SA. STABLE.   # DISPOSITION:** please add -estroge/LH; FSH;or not need drawn today # herceptin today  # in 3 weeks- [mondays]-MD; labs- cbc/cmp; Herceptin-Dr.B

## 2021-07-16 NOTE — Progress Notes (Signed)
[facility] NOTE  Patient Care Team: [name], MD as PCP - General (Internal Medicine) [name], RN as Registered Nurse  CHIEF COMPLAINTS/PURPOSE OF CONSULTATION: Breast cancer    Oncology History Overview Note  # April 2022-stage Ia mammary carcinoma ER/PR positive HER2 positive [TRIPLE positive]; negative margins s/p simple mastectomy with plan for immediate reconstruction [Dr.Rodenberg/[name]]; NO RT  # May 2nd, 2022- Taxol-Herceptin  # MUGA scan- 64% [03/06/2021]-   # LMP- mid 2020; Uterine ablation- [phone]; # HTN; Asthma- well controlled [on allergy shots];Marland Kitchen    Malignant neoplasm of lower-outer quadrant of left breast of female, estrogen receptor positive ([employer])  12/25/2020 Initial Diagnosis   Malignant neoplasm of lower-outer quadrant of left breast of female, estrogen receptor positive ([place])   02/26/2021 Cancer Staging   Staging form: Breast, AJCC 8th Edition - Pathologic: Stage IA (pT1c, pN0, cM0, G3, ER+, PR+, HER2+) - Signed by Cammie Sickle, MD on 02/26/2021 Mitotic count score: Score 3 Histologic grading system: 3 grade system   03/10/2021 -  Chemotherapy    Patient is on Treatment Plan: BREAST PACLITAXEL + TRASTUZUMAB Q7D / TRASTUZUMAB Q21D        Genetic Testing   Negative genetic testing. No pathogenic variants identified on the [facility] Multi-Cancer+RNA panel. 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 The report date is 04/29/2021.  The Multi-Cancer Panel + RNA offered by [facility] includes sequencing and/or deletion duplication testing of the following 84 genes: AIP, ALK, APC, ATM, AXIN2,BAP1,  BARD1, BLM, BMPR1A, BRCA1, BRCA2, BRIP1, CASR, CDC73, CDH1, CDK4, CDKN1B, CDKN1C, CDKN2A (p14ARF), CDKN2A (p16INK4a), CEBPA, CHEK2, CTNNA1, DICER1, DIS3L2, EGFR (c.2369C>T, p.Thr790Met variant only), EPCAM (Deletion/duplication testing only), FH, FLCN, GATA2, GPC3, GREM1 (Promoter region deletion/duplication testing only), HOXB13  (c.251G>A, p.Gly84Glu), HRAS, KIT, MAX, MEN1, MET, MITF (c.952G>A, p.Glu318Lys variant only), MLH1, MSH2, MSH3, MSH6, MUTYH, NBN, NF1, NF2, NTHL1, PALB2, PDGFRA, PHOX2B, PMS2, POLD1, POLE, POT1, PRKAR1A, PTCH1, PTEN, RAD50, RAD51C, RAD51D, RB1, RECQL4, RET, RUNX1, SDHAF2, SDHA (sequence changes only), SDHB, SDHC, SDHD, SMAD4, SMARCA4, SMARCB1, SMARCE1, STK11, SUFU, TERC, TERT, TMEM127, TP53, TSC1, TSC2, VHL, WRN and WT1.      HISTORY OF PRESENTING ILLNESS:  Judy Padilla 55 y.o.  female newly diagnosed breast cancer stage I ER/PR positive HER2/neu positive on adjuvant Herceptin is here for follow-up.  No worsening tingling and numbness EXTR.  Continues to have improvement of the nail changes.  Continues to have mild darkening of the skin of the hands.\\   Review of Systems  Constitutional:  Negative for chills, diaphoresis, fever, malaise/fatigue and weight loss.  HENT:  Negative for nosebleeds and sore throat.   Eyes:  Negative for double vision.  Respiratory:  Negative for cough, hemoptysis, sputum production, shortness of breath and wheezing.   Cardiovascular:  Negative for chest pain, palpitations, orthopnea and leg swelling.  Gastrointestinal:  Negative for abdominal pain, blood in stool, constipation, diarrhea, heartburn, melena, nausea and vomiting.  Genitourinary:  Negative for dysuria, frequency and urgency.  Musculoskeletal:  Negative for back pain and joint pain.  Skin: Negative.  Negative for itching and rash.  Neurological:  Negative for dizziness, tingling, focal weakness, weakness and headaches.  Endo/Heme/Allergies:  Does not bruise/bleed easily.  Psychiatric/Behavioral:  Negative for depression. The patient is not nervous/anxious and does not have insomnia.     MEDICAL HISTORY:  Past Medical History:  Diagnosis Date   Asthma    well controlled   Breast cancer ([place])    Family history of adverse reaction  to anesthesia    sister-hard time waking up   Family history  of breast cancer    Family history of prostate cancer    Family history of uterine cancer    GERD (gastroesophageal reflux disease)    Hypertension     SURGICAL HISTORY: Past Surgical History:  Procedure Laterality Date   ABLATION     BREAST BIOPSY Left 2011   Benign per pt   BREAST BIOPSY Left 12/12/2020   3:30 3 cmfn, Q marker, path pending   BREAST BIOPSY Left 12/12/2020   3:30 1 cmfn, Vision marker, path pending   BREAST RECONSTRUCTION WITH PLACEMENT OF TISSUE EXPANDER AND FLEX HD (ACELLULAR HYDRATED DERMIS) Left 02/10/2021   Procedure: IMMEDIATE LEFT BREAST RECONSTRUCTION WITH PLACEMENT OF TISSUE EXPANDER AND FLEX HD (ACELLULAR HYDRATED DERMIS);  Surgeon: Wallace Going, DO;  Location: [facility];  Service: Plastics;  Laterality: Left;   COLONOSCOPY  01/15/2020   PORTACATH PLACEMENT Right 02/10/2021   Procedure: INSERTION PORT-A-CATH;  Surgeon: Ronny Bacon, MD;  Location: [facility];  Service: General;  Laterality: Right;   REMOVAL OF TISSUE EXPANDER AND PLACEMENT OF IMPLANT Left 07/09/2021   Procedure: REMOVAL OF TISSUE EXPANDER AND PLACEMENT OF IMPLANT LEFT BREAST;  Surgeon: Wallace Going, DO;  Location: [place];  Service: Plastics;  Laterality: Left;   SIMPLE MASTECTOMY WITH AXILLARY SENTINEL NODE BIOPSY Left 02/10/2021   Procedure: SIMPLE MASTECTOMY WITH AXILLARY SENTINEL NODE BIOPSY;  Surgeon: Ronny Bacon, MD;  Location: [facility];  Service: General;  Laterality: Left;    SOCIAL HISTORY: Social History   Socioeconomic History   Marital status: Married    Spouse name: Not on file   Number of children: Not on file   Years of education: Not on file   Highest education level: Not on file  Occupational History   Not on file  Tobacco Use   Smoking status: Never   Smokeless tobacco: Never  Vaping Use   Vaping Use: Never used  Substance and Sexual Activity   Alcohol use: Not Currently   Drug use: Never   Sexual activity: Not on file   Other Topics Concern   Not on file  Social History Narrative   Lives in [place] with husband; kids- college. Works for [employer]- working from home. No smoking or alcohol.    Social Determinants of Health   Financial Resource Strain: Not on file  Food Insecurity: Not on file  Transportation Needs: Not on file  Physical Activity: Not on file  Stress: Not on file  Social Connections: Not on file  Intimate Partner Violence: Not on file    FAMILY HISTORY: Family History  Problem Relation Age of Onset   Hypertension Mother    Diabetes Mother    Hypertension Father    Diabetes Father    Cancer Father        prostate cancer-70s   Cancer Maternal Grandmother        uterine cancer   Breast cancer Sister        in in 69s.     ALLERGIES:  is allergic to shrimp extract allergy skin test and other.  MEDICATIONS:  Current Outpatient Medications  Medication Sig Dispense Refill   albuterol (VENTOLIN HFA) 108 (90 Base) MCG/ACT inhaler Inhale 1 puff into the lungs every 6 (six) hours as needed for shortness of breath.     amLODipine (NORVASC) 10 MG tablet Take 10 mg by mouth every morning.     budesonide (PULMICORT)  0.5 MG/2ML nebulizer solution Take 2 mLs by nebulization daily.     cetirizine (ZYRTEC) 10 MG tablet Take 10 mg by mouth daily as needed for allergies.     Cholecalciferol 1.25 MG (50000 UT) capsule Take 50,000 Units by mouth once a week.     fluticasone (FLONASE) 50 MCG/ACT nasal spray Place 1 spray into both nostrils daily as needed for allergies.     fluticasone furoate-vilanterol (BREO ELLIPTA) 100-25 MCG/INH AEPB Inhale 1 puff into the lungs as needed for shortness of breath.     GNP BUDESONIDE NASAL SPRAY NA Place 1 spray into the nose daily.     lidocaine-prilocaine (EMLA) cream Apply 30 -45 mins prior to port access. 50 g 0   Multiple Vitamins-Minerals (CENTRUM ADULTS) TABS Take 1 tablet by mouth daily.     naproxen sodium (ALEVE) 220 MG tablet  Take 220 mg by mouth daily as needed (knee pain).     NON FORMULARY Takes weekly allergy shots     rosuvastatin (CRESTOR) 40 MG tablet Take 40 mg by mouth daily.     EPINEPHrine 0.3 mg/0.3 mL IJ SOAJ injection Inject 0.3 mg into the muscle as needed for anaphylaxis. (Patient not taking: No sig reported)     ondansetron (ZOFRAN) 4 MG tablet Take 1 tablet (4 mg total) by mouth every 8 (eight) hours as needed for nausea or vomiting. (Patient not taking: No sig reported) 20 tablet 0   ondansetron (ZOFRAN) 4 MG tablet Take 1 tablet (4 mg total) by mouth every 8 (eight) hours as needed for nausea or vomiting. (Patient not taking: Reported on 07/16/2021) 20 tablet 0   ondansetron (ZOFRAN) 8 MG tablet One pill every 8 hours as needed for nausea/vomitting. (Patient not taking: No sig reported) 40 tablet 1   pantoprazole (PROTONIX) 40 MG tablet Take 40 mg by mouth every morning. (Patient not taking: Reported on 07/16/2021)     prochlorperazine (COMPAZINE) 10 MG tablet Take 1 tablet (10 mg total) by mouth every 6 (six) hours as needed for nausea or vomiting. (Patient not taking: Reported on 07/16/2021) 40 tablet 1   No current facility-administered medications for this visit.   Facility-Administered Medications Ordered in Other Visits  Medication Dose Route Frequency Provider Last Rate Last Admin   sodium chloride flush (NS) 0.9 % injection 10 mL  10 mL Intracatheter PRN Cammie Sickle, MD   10 mL at 07/16/21 1013      .  PHYSICAL EXAMINATION: ECOG PERFORMANCE STATUS: 0 - Asymptomatic  Vitals:   07/16/21 0912  BP: 126/78  Pulse: 80  Resp: 16  Temp: 98.3 F (36.8 C)  SpO2: 100%   Filed Weights   07/16/21 0912  Weight: 191 lb 3.2 oz (86.7 kg)    Physical Exam Constitutional:      Comments: Accompanied by husband.  Ambulating independently.  HENT:     Head: Normocephalic and atraumatic.     Mouth/Throat:     Pharynx: No oropharyngeal exudate.  Eyes:     Pupils: Pupils are equal,  round, and reactive to light.  Cardiovascular:     Rate and Rhythm: Normal rate and regular rhythm.  Pulmonary:     Effort: Pulmonary effort is normal. No respiratory distress.     Breath sounds: Normal breath sounds. No wheezing.  Abdominal:     General: Bowel sounds are normal. There is no distension.     Palpations: Abdomen is soft. There is no mass.     Tenderness: There  is no abdominal tenderness. There is no guarding or rebound.  Musculoskeletal:        General: No tenderness. Normal range of motion.     Cervical back: Normal range of motion and neck supple.  Skin:    General: Skin is warm.  Neurological:     Mental Status: She is alert and oriented to person, place, and time.  Psychiatric:        Mood and Affect: Affect normal.     LABORATORY DATA:  I have reviewed the data as listed Lab Results  Component Value Date   WBC 4.8 07/16/2021   HGB 11.6 (L) 07/16/2021   HCT 35.8 (L) 07/16/2021   MCV 82.5 07/16/2021   PLT 280 07/16/2021   Recent Labs    06/04/21 0924 06/04/21 1052 06/23/21 0920 07/16/21 0842  NA 142 137 138 136  K 2.0* 4.2 3.9 4.0  CL 129* 109 107 106  CO2 13* '25 24 25  ' GLUCOSE 67* 111* 106* 106*  BUN 13 22* 19 15  CREATININE 0.40* 0.95 0.79 0.83  CALCIUM 4.4* 8.9 8.9 8.7*  GFRNONAA >60 >60 >60 >60  PROT 3.2*  --  6.8 6.9  ALBUMIN 2.0*  --  4.0 4.0  AST 12*  --  24 20  ALT 20  --  31 26  ALKPHOS 31*  --  78 78  BILITOT 0.3  --  0.6 0.5    RADIOGRAPHIC STUDIES: I have personally reviewed the radiological images as listed and agreed with the findings in the report. DG Bone Density  Result Date: 06/30/2021 EXAM: DUAL X-RAY ABSORPTIOMETRY (DXA) FOR BONE MINERAL DENSITY IMPRESSION: Your patient Judy Padilla completed a BMD test on 06/30/2021 using the [place] (software version: 14.10) manufactured by [employer]. The following summarizes the results of our evaluation. Technologist:VLM PATIENT BIOGRAPHICAL: Name:  Judy Padilla, Judy Padilla Patient ID: [id number] Birth Date: May 30, 1966 Height: 68.0 in. Gender: Female Exam Date: 06/30/2021 Weight: 192.0 lbs. Indications: History of Breast Cancer, History of Chemo, Postmenopausal Fractures: Foot Treatments: Albuterol, BREO, calcium w/ vit D, Herceptin DENSITOMETRY RESULTS: Site      Region     Measured Date Measured Age WHO Classification Young Adult T-score BMD         %Change vs. Previous Significant Change (*) AP Spine L1-L4 06/30/20[age].0 Normal 2.2 1.468 g/cm2 - - DualFemur Neck Right 06/30/2019 57.0 Normal 0.3 1.082 g/cm2 - - ASSESSMENT: The BMD measured at Femur Neck Right is 1.082 g/cm2 with a T-score of 0.3. This patient is considered normal according to [facility]) criteria. The scan quality is good. World Pharmacologist [facility]) criteria for post-menopausal, Caucasian Women: Normal:                   T-score at or above -1 SD Osteopenia/low bone mass: T-score between -1 and -2.5 SD Osteoporosis:             T-score at or below -2.5 SD RECOMMENDATIONS: 1. All patients should optimize calcium and vitamin D intake. 2. Consider FDA-approved medical therapies in postmenopausal women and men aged 49 years and older, based on the following: a. A hip or vertebral(clinical or morphometric) fracture b. T-score < -2.5 at the femoral neck or spine after appropriate evaluation to exclude secondary causes c. Low bone mass (T-score between -1.0 and -2.5 at the femoral neck or spine) and a 10-year probability of a hip fracture > 3% or a 10-year probability of a  major osteoporosis-related fracture > 20% based on the [place]-adapted WHO algorithm 3. Clinician judgment and/or patient preferences may indicate treatment for people with 10-year fracture probabilities above or below these levels FOLLOW-UP: People with diagnosed cases of osteoporosis or at high risk for fracture should have regular bone mineral density tests. For patients eligible for Medicare, routine testing is  allowed once every 2 years. The testing frequency can be increased to one year for patients who have rapidly progressing disease, those who are receiving or discontinuing medical therapy to restore bone mass, or have additional risk factors. I have reviewed this report, and agree with the above findings. Mark A. Thornton Papas, M.D. [facility] Electronically Signed   By: Lavonia Dana M.D.   On: 06/30/2021 15:10    ASSESSMENT & PLAN:   Malignant neoplasm of lower-outer quadrant of left breast of female, estrogen receptor positive ([place]) # Left breast- CA- s/p mastectomy-  pT1c pN0 [Stage IA]Grade 3.  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  On adjuvant Herceptin. STABLE.   #Proceed with cycle #5 herceptin q3 W. Labs today reviewed;  acceptable for treatment today.  AUG 2022- LVEF- 56.8 %.Comparable to LVEF equals 63.8% on 03/05/2021;  MUGA scan every 3 to 4 months.   # Discussed re: Anti-hormone-tamoxifen versus AI-based on postmenopausal status- discssed; awaiting hormones-; plan script next viist.  Reviewed BMD- AUG 2022- T score= 0.3.   # Bil hands- dorsum hyperpigmentation continue urea cream 40%+ SA. STABLE.   # DISPOSITION:** please add -estroge/LH; FSH;or not need drawn today # herceptin today  # in 3 weeks- [mondays]-MD; labs- cbc/cmp; Herceptin-Dr.B  All questions were answered. The patient/family knows to call the clinic with any problems, questions or concerns.    Cammie Sickle, MD 07/16/2021 1:15 PM

## 2021-07-16 NOTE — Progress Notes (Signed)
Pt has no concerns or complaints at this time.

## 2021-07-17 DIAGNOSIS — J301 Allergic rhinitis due to pollen: Secondary | ICD-10-CM | POA: Diagnosis not present

## 2021-07-17 LAB — FOLLICLE STIMULATING HORMONE: FSH: 40.3 m[IU]/mL

## 2021-07-17 LAB — LUTEINIZING HORMONE: LH: 29.4 m[IU]/mL

## 2021-07-18 ENCOUNTER — Ambulatory Visit (INDEPENDENT_AMBULATORY_CARE_PROVIDER_SITE_OTHER): Payer: No Typology Code available for payment source | Admitting: Plastic Surgery

## 2021-07-18 ENCOUNTER — Other Ambulatory Visit: Payer: Self-pay

## 2021-07-18 ENCOUNTER — Encounter: Payer: Self-pay | Admitting: Plastic Surgery

## 2021-07-18 DIAGNOSIS — Z170 Estrogen receptor positive status [ER+]: Secondary | ICD-10-CM

## 2021-07-18 DIAGNOSIS — C50512 Malignant neoplasm of lower-outer quadrant of left female breast: Secondary | ICD-10-CM

## 2021-07-18 LAB — ESTROGENS, TOTAL: Estrogen: 55 pg/mL (ref 40–244)

## 2021-07-18 NOTE — Progress Notes (Signed)
The patient is a 55 year old female here for follow-up on her left breast reconstruction.  She had her expander removed and an implant placed on August 31.  She has a Product manager smooth round ultrahigh profile [id number] cc silicone implant.  She is doing really well.  No sign of hematoma or seroma.  I removed the honeycomb today.  I would like to see her back in 2 weeks.  Her pain is well controlled and no issues.  We will get her picture next visit.

## 2021-07-24 DIAGNOSIS — J301 Allergic rhinitis due to pollen: Secondary | ICD-10-CM | POA: Diagnosis not present

## 2021-07-31 ENCOUNTER — Ambulatory Visit (INDEPENDENT_AMBULATORY_CARE_PROVIDER_SITE_OTHER): Payer: No Typology Code available for payment source | Admitting: Surgical

## 2021-07-31 ENCOUNTER — Encounter: Payer: Self-pay | Admitting: Surgical

## 2021-07-31 ENCOUNTER — Other Ambulatory Visit: Payer: Self-pay

## 2021-07-31 DIAGNOSIS — C50512 Malignant neoplasm of lower-outer quadrant of left female breast: Secondary | ICD-10-CM

## 2021-07-31 DIAGNOSIS — Z170 Estrogen receptor positive status [ER+]: Secondary | ICD-10-CM

## 2021-07-31 DIAGNOSIS — J301 Allergic rhinitis due to pollen: Secondary | ICD-10-CM | POA: Diagnosis not present

## 2021-07-31 DIAGNOSIS — D4862 Neoplasm of uncertain behavior of left breast: Secondary | ICD-10-CM

## 2021-07-31 NOTE — Progress Notes (Signed)
Patient is a 55 year old female here for follow-up on her left breast reconstruction.  She had the expander exchange to an implant on 07/09/2021 with Dr. Marla Roe.  She is 3 weeks postop.  She is doing well.  She is very pleased with her results.  She is not interested in any further reconstruction via fat grafting or nipple areola tattooing at this time.    Chaperone present on exam On exam Steri-Strips are in place, these were removed to reveal a well-healed incision.  There is no erythema or subcutaneous fluid collections noted with palpation.  No wounds noted.  3 more weeks of restrictions, no heavy lifting, continue to wear compressive garment 24/7. Recommend following up in 6 months.  Recommend calling with questions or concerns.  There is no sign of infection, seroma, hematoma.  Pictures were taken and placed in the patient's chart with patient's permission.

## 2021-08-02 DIAGNOSIS — J0100 Acute maxillary sinusitis, unspecified: Secondary | ICD-10-CM | POA: Diagnosis not present

## 2021-08-02 DIAGNOSIS — M170 Bilateral primary osteoarthritis of knee: Secondary | ICD-10-CM | POA: Diagnosis not present

## 2021-08-02 DIAGNOSIS — R0981 Nasal congestion: Secondary | ICD-10-CM | POA: Diagnosis not present

## 2021-08-06 ENCOUNTER — Ambulatory Visit: Payer: No Typology Code available for payment source

## 2021-08-06 ENCOUNTER — Ambulatory Visit: Payer: No Typology Code available for payment source | Admitting: Internal Medicine

## 2021-08-06 ENCOUNTER — Other Ambulatory Visit: Payer: No Typology Code available for payment source

## 2021-08-07 DIAGNOSIS — J301 Allergic rhinitis due to pollen: Secondary | ICD-10-CM | POA: Diagnosis not present

## 2021-08-11 ENCOUNTER — Inpatient Hospital Stay: Payer: No Typology Code available for payment source | Attending: Internal Medicine

## 2021-08-11 ENCOUNTER — Inpatient Hospital Stay: Payer: No Typology Code available for payment source

## 2021-08-11 ENCOUNTER — Encounter: Payer: Self-pay | Admitting: Internal Medicine

## 2021-08-11 ENCOUNTER — Inpatient Hospital Stay (HOSPITAL_BASED_OUTPATIENT_CLINIC_OR_DEPARTMENT_OTHER): Payer: No Typology Code available for payment source | Admitting: Internal Medicine

## 2021-08-11 DIAGNOSIS — J45909 Unspecified asthma, uncomplicated: Secondary | ICD-10-CM | POA: Insufficient documentation

## 2021-08-11 DIAGNOSIS — C50512 Malignant neoplasm of lower-outer quadrant of left female breast: Secondary | ICD-10-CM | POA: Diagnosis not present

## 2021-08-11 DIAGNOSIS — Z5112 Encounter for antineoplastic immunotherapy: Secondary | ICD-10-CM | POA: Insufficient documentation

## 2021-08-11 DIAGNOSIS — Z170 Estrogen receptor positive status [ER+]: Secondary | ICD-10-CM | POA: Diagnosis not present

## 2021-08-11 DIAGNOSIS — I10 Essential (primary) hypertension: Secondary | ICD-10-CM | POA: Diagnosis not present

## 2021-08-11 DIAGNOSIS — L819 Disorder of pigmentation, unspecified: Secondary | ICD-10-CM | POA: Diagnosis not present

## 2021-08-11 LAB — COMPREHENSIVE METABOLIC PANEL
ALT: 21 U/L (ref 0–44)
AST: 16 U/L (ref 15–41)
Albumin: 3.9 g/dL (ref 3.5–5.0)
Alkaline Phosphatase: 71 U/L (ref 38–126)
Anion gap: 4 — ABNORMAL LOW (ref 5–15)
BUN: 24 mg/dL — ABNORMAL HIGH (ref 6–20)
CO2: 25 mmol/L (ref 22–32)
Calcium: 8.8 mg/dL — ABNORMAL LOW (ref 8.9–10.3)
Chloride: 107 mmol/L (ref 98–111)
Creatinine, Ser: 0.85 mg/dL (ref 0.44–1.00)
GFR, Estimated: >60 mL/min (ref >60)
Glucose, Bld: 115 mg/dL — ABNORMAL HIGH (ref 70–99)
Potassium: 4 mmol/L (ref 3.5–5.1)
Sodium: 136 mmol/L (ref 135–145)
Total Bilirubin: 0.5 mg/dL (ref 0.3–1.2)
Total Protein: 6.9 g/dL (ref 6.5–8.1)

## 2021-08-11 LAB — CBC WITH DIFFERENTIAL/PLATELET
Abs Immature Granulocytes: 0.04 10*3/uL (ref 0.00–0.07)
Basophils Absolute: 0 10*3/uL (ref 0.0–0.1)
Basophils Relative: 0 %
Eosinophils Absolute: 0.2 10*3/uL (ref 0.0–0.5)
Eosinophils Relative: 3 %
HCT: 35.1 % — ABNORMAL LOW (ref 36.0–46.0)
Hemoglobin: 11.1 g/dL — ABNORMAL LOW (ref 12.0–15.0)
Immature Granulocytes: 1 %
Lymphocytes Relative: 24 %
Lymphs Abs: 1.7 10*3/uL (ref 0.7–4.0)
MCH: 26.2 pg (ref 26.0–34.0)
MCHC: 31.6 g/dL (ref 30.0–36.0)
MCV: 82.8 fL (ref 80.0–100.0)
Monocytes Absolute: 0.8 10*3/uL (ref 0.1–1.0)
Monocytes Relative: 11 %
Neutro Abs: 4.6 10*3/uL (ref 1.7–7.7)
Neutrophils Relative %: 61 %
Platelets: 262 10*3/uL (ref 150–400)
RBC: 4.24 MIL/uL (ref 3.87–5.11)
RDW: 15.8 % — ABNORMAL HIGH (ref 11.5–15.5)
WBC: 7.3 10*3/uL (ref 4.0–10.5)
nRBC: 0 % (ref 0.0–0.2)

## 2021-08-11 MED ORDER — ACETAMINOPHEN 325 MG PO TABS
650.0000 mg | ORAL_TABLET | Freq: Once | ORAL | Status: AC
  Administered 2021-08-11: 650 mg via ORAL
  Filled 2021-08-11: qty 2

## 2021-08-11 MED ORDER — HEPARIN SOD (PORK) LOCK FLUSH 100 UNIT/ML IV SOLN
INTRAVENOUS | Status: AC
  Administered 2021-08-11: 500 [IU]
  Filled 2021-08-11: qty 5

## 2021-08-11 MED ORDER — ANASTROZOLE 1 MG PO TABS: 1.0000 mg | ORAL_TABLET | Freq: Every day | ORAL | 3 refills | Status: DC

## 2021-08-11 MED ORDER — SODIUM CHLORIDE 0.9 % IV SOLN
Freq: Once | INTRAVENOUS | Status: AC
  Filled 2021-08-11: qty 250

## 2021-08-11 MED ORDER — TRASTUZUMAB-ANNS CHEMO 150 MG IV SOLR
6.0000 mg/kg | Freq: Once | INTRAVENOUS | Status: AC
  Administered 2021-08-11: 525 mg via INTRAVENOUS
  Filled 2021-08-11: qty 25

## 2021-08-11 MED ORDER — HEPARIN SOD (PORK) LOCK FLUSH 100 UNIT/ML IV SOLN
500.0000 [IU] | Freq: Once | INTRAVENOUS | Status: AC | PRN
Start: 2021-08-11 — End: 2021-08-11
  Filled 2021-08-11: qty 5

## 2021-08-11 NOTE — Patient Instructions (Signed)
[facility]  Discharge Instructions: Thank you for choosing [facility] to provide your oncology and hematology care.  If you have a lab appointment with the [facility], please go directly to the [facility] and check in at the registration area.  Wear comfortable clothing and clothing appropriate for easy access to any Portacath or PICC line.   We strive to give you quality time with your provider. You may need to reschedule your appointment if you arrive late (15 or more minutes).  Arriving late affects you and other patients whose appointments are after yours.  Also, if you miss three or more appointments without notifying the office, you may be dismissed from the clinic at the provider's discretion.      For prescription refill requests, have your pharmacy contact our office and allow 72 hours for refills to be completed.    Today you received the following chemotherapy and/or immunotherapy agents kanjinti   To help prevent nausea and vomiting after your treatment, we encourage you to take your nausea medication as directed.  BELOW ARE SYMPTOMS THAT SHOULD BE REPORTED IMMEDIATELY: *FEVER GREATER THAN 100.4 F (38 C) OR HIGHER *CHILLS OR SWEATING *NAUSEA AND VOMITING THAT IS NOT CONTROLLED WITH YOUR NAUSEA MEDICATION *UNUSUAL SHORTNESS OF BREATH *UNUSUAL BRUISING OR BLEEDING *URINARY PROBLEMS (pain or burning when urinating, or frequent urination) *BOWEL PROBLEMS (unusual diarrhea, constipation, pain near the anus) TENDERNESS IN MOUTH AND THROAT WITH OR WITHOUT PRESENCE OF ULCERS (sore throat, sores in mouth, or a toothache) UNUSUAL RASH, SWELLING OR PAIN  UNUSUAL VAGINAL DISCHARGE OR ITCHING   Items with * indicate a potential emergency and should be followed up as soon as possible or go to the Emergency Department if any problems should occur.  Please show the CHEMOTHERAPY ALERT CARD or IMMUNOTHERAPY ALERT CARD at check-in to  the Emergency Department and triage nurse.  Should you have questions after your visit or need to cancel or reschedule your appointment, please contact [facility]  [phone] and follow the prompts.  Office hours are 8:00 a.m. to 4:30 p.m. Monday - Friday. Please note that voicemails left after 4:00 p.m. may not be returned until the following business day.  We are closed weekends and major holidays. You have access to a nurse at all times for urgent questions. Please call the main number to the clinic [phone] and follow the prompts.  For any non-urgent questions, you may also contact your provider using MyChart. We now offer e-Visits for anyone 76 and older to request care online for non-urgent symptoms. For details visit [email or web address].   Also download the MyChart app! Go to the app store, search "MyChart", open the app, select [facility], and log in with your MyChart username and password.  Due to Covid, a mask is required upon entering the hospital/clinic. If you do not have a mask, one will be given to you upon arrival. For doctor visits, patients may have 1 support person aged 59 or older with them. For treatment visits, patients cannot have anyone with them due to current Covid guidelines and our immunocompromised population.

## 2021-08-11 NOTE — Assessment & Plan Note (Addendum)
#  Left breast- CA- s/p mastectomy-  pT1c pN0 [Stage IA]Grade 3.  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  On adjuvant Herceptin. STABLE.   #Proceed with cycle #6 herceptin q3 W. Labs today reviewed;  acceptable for treatment today.  AUG 2022- LVEF- 56.8 %.Comparable to LVEF equals 63.8% on 03/05/2021;  MUGA scan every 3 to 4 months.   # Discussed the mechanism of action of aromatase inhibitors-with blocking of estrogen to prevent breast cancer.  Also discussed the potential side effects including but not limited to arthralgias hot flashes and increased risk of osteoporosis. Script for anastrazole sent. Post menopausal- hormonal status reviewed.  Start patient on anastrozole.  # Bil hands- dorsum hyperpigmentation continue urea cream 40%+ SA. STABLE.   # DISPOSITION: # herceptin today  # in 3 weeks- [mondays]-MD; labs- cbc/cmp; Herceptin-Dr.B

## 2021-08-11 NOTE — Progress Notes (Signed)
one [place] NOTE  Patient Care Team: [name], MD as PCP - General (Internal Medicine) [name], RN as Registered Nurse  CHIEF COMPLAINTS/PURPOSE OF CONSULTATION: Breast cancer    Oncology History Overview Note  # April 2022-stage Ia mammary carcinoma ER/PR positive HER2 positive [TRIPLE positive]; negative margins s/p simple mastectomy with plan for immediate reconstruction [Dr.Rodenberg/[name]]; NO RT  # May 2nd, 2022- Taxol-Herceptin  # OCT 3rd, 2022- Anastrazole 21m/day [AUG 2022- BMD- WNL. ]  # MUGA scan- 64% [03/06/2021]-   # LMP- mid 2020; Uterine ablation- [id number]; # HTN; Asthma- well controlled [on allergy shots];.Marland Kitchen   Malignant neoplasm of lower-outer quadrant of left breast of female, estrogen receptor positive ([facility]  12/25/2020 Initial Diagnosis   Malignant neoplasm of lower-outer quadrant of left breast of female, estrogen receptor positive ([facility]   02/26/2021 Cancer Staging   Staging form: Breast, AJCC 8th Edition - Pathologic: Stage IA (pT1c, pN0, cM0, G3, ER+, PR+, HER2+) - Signed by BCammie Sickle MD on 02/26/2021 Mitotic count score: Score 3 Histologic grading system: 3 grade system   03/10/2021 -  Chemotherapy   Patient is on Treatment Plan : BREAST Paclitaxel + Trastuzumab q7d / Trastuzumab q21d      Genetic Testing   Negative genetic testing. No pathogenic variants identified on the [facility] Multi-Cancer+RNA panel. 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 The report date is 04/29/2021.  The Multi-Cancer Panel + RNA offered by [facility] includes sequencing and/or deletion duplication testing of the following 84 genes: AIP, ALK, APC, ATM, AXIN2,BAP1,  BARD1, BLM, BMPR1A, BRCA1, BRCA2, BRIP1, CASR, CDC73, CDH1, CDK4, CDKN1B, CDKN1C, CDKN2A (p14ARF), CDKN2A (p16INK4a), CEBPA, CHEK2, CTNNA1, DICER1, DIS3L2, EGFR (c.2369C>T, p.Thr790Met variant only), EPCAM (Deletion/duplication testing only), FH, FLCN, GATA2, GPC3, GREM1  (Promoter region deletion/duplication testing only), HOXB13 (c.251G>A, p.Gly84Glu), HRAS, KIT, MAX, MEN1, MET, MITF (c.952G>A, p.Glu318Lys variant only), MLH1, MSH2, MSH3, MSH6, MUTYH, NBN, NF1, NF2, NTHL1, PALB2, PDGFRA, PHOX2B, PMS2, POLD1, POLE, POT1, PRKAR1A, PTCH1, PTEN, RAD50, RAD51C, RAD51D, RB1, RECQL4, RET, RUNX1, SDHAF2, SDHA (sequence changes only), SDHB, SDHC, SDHD, SMAD4, SMARCA4, SMARCB1, SMARCE1, STK11, SUFU, TERC, TERT, TMEM127, TP53, TSC1, TSC2, VHL, WRN and WT1.      HISTORY OF PRESENTING ILLNESS: Alone.  Ambulating independently. LLenetta [id number].o.  female newly diagnosed breast cancer stage I ER/PR positive HER2/neu positive on adjuvant Herceptin is here for follow-up.  Patient states her hyperpigmentation on the hands is improving.  Denies any worsening tingling or numbness in extremities.  Nail changes improving.  Review of Systems  Constitutional:  Negative for chills, diaphoresis, fever, malaise/fatigue and weight loss.  HENT:  Negative for nosebleeds and sore throat.   Eyes:  Negative for double vision.  Respiratory:  Negative for cough, hemoptysis, sputum production, shortness of breath and wheezing.   Cardiovascular:  Negative for chest pain, palpitations, orthopnea and leg swelling.  Gastrointestinal:  Negative for abdominal pain, blood in stool, constipation, diarrhea, heartburn, melena, nausea and vomiting.  Genitourinary:  Negative for dysuria, frequency and urgency.  Musculoskeletal:  Negative for back pain and joint pain.  Skin: Negative.  Negative for itching and rash.  Neurological:  Negative for dizziness, tingling, focal weakness, weakness and headaches.  Endo/Heme/Allergies:  Does not bruise/bleed easily.  Psychiatric/Behavioral:  Negative for depression. The patient is not nervous/anxious and does not have insomnia.     MEDICAL HISTORY:  Past Medical History:  Diagnosis Date   Asthma    well controlled   Breast cancer ([facility]  Family history  of adverse reaction to anesthesia    sister-hard time waking up   Family history of breast cancer    Family history of prostate cancer    Family history of uterine cancer    GERD (gastroesophageal reflux disease)    Hypertension     SURGICAL HISTORY: Past Surgical History:  Procedure Laterality Date   ABLATION     BREAST BIOPSY Left 2011   Benign per pt   BREAST BIOPSY Left 12/12/2020   3:30 3 cmfn, Q marker, path pending   BREAST BIOPSY Left 12/12/2020   3:30 1 cmfn, Vision marker, path pending   BREAST RECONSTRUCTION WITH PLACEMENT OF TISSUE EXPANDER AND FLEX HD (ACELLULAR HYDRATED DERMIS) Left 02/10/2021   Procedure: IMMEDIATE LEFT BREAST RECONSTRUCTION WITH PLACEMENT OF TISSUE EXPANDER AND FLEX HD (ACELLULAR HYDRATED DERMIS);  Surgeon: Wallace Going, DO;  Location: [facility];  Service: Plastics;  Laterality: Left;   COLONOSCOPY  01/15/2020   PORTACATH PLACEMENT Right 02/10/2021   Procedure: INSERTION PORT-A-CATH;  Surgeon: Ronny Bacon, MD;  Location: [facility];  Service: General;  Laterality: Right;   REMOVAL OF TISSUE EXPANDER AND PLACEMENT OF IMPLANT Left 07/09/2021   Procedure: REMOVAL OF TISSUE EXPANDER AND PLACEMENT OF IMPLANT LEFT BREAST;  Surgeon: Wallace Going, DO;  Location: [place];  Service: Plastics;  Laterality: Left;   SIMPLE MASTECTOMY WITH AXILLARY SENTINEL NODE BIOPSY Left 02/10/2021   Procedure: SIMPLE MASTECTOMY WITH AXILLARY SENTINEL NODE BIOPSY;  Surgeon: Ronny Bacon, MD;  Location: [facility];  Service: General;  Laterality: Left;    SOCIAL HISTORY: Social History   Socioeconomic History   Marital status: Married    Spouse name: Not on file   Number of children: Not on file   Years of education: Not on file   Highest education level: Not on file  Occupational History   Not on file  Tobacco Use   Smoking status: Never   Smokeless tobacco: Never  Vaping Use   Vaping Use: Never used  Substance and Sexual Activity    Alcohol use: Not Currently   Drug use: Never   Sexual activity: Not on file  Other Topics Concern   Not on file  Social History Narrative   Lives in [place] with husband; kids- college. Works for [employer]- working from home. No smoking or alcohol.    Social Determinants of Health   Financial Resource Strain: Not on file  Food Insecurity: Not on file  Transportation Needs: Not on file  Physical Activity: Not on file  Stress: Not on file  Social Connections: Not on file  Intimate Partner Violence: Not on file    FAMILY HISTORY: Family History  Problem Relation Age of Onset   Hypertension Mother    Diabetes Mother    Hypertension Father    Diabetes Father    Cancer Father        prostate cancer-70s   Cancer Maternal Grandmother        uterine cancer   Breast cancer Sister        in in 67s.     ALLERGIES:  is allergic to shrimp extract allergy skin test and other.  MEDICATIONS:  Current Outpatient Medications  Medication Sig Dispense Refill   albuterol (VENTOLIN HFA) 108 (90 Base) MCG/ACT inhaler Inhale 1 puff into the lungs every 6 (six) hours as needed for shortness of breath.     amLODipine (NORVASC) 10 MG tablet Take 10 mg by mouth every morning.  anastrozole (ARIMIDEX) 1 MG tablet Take 1 tablet (1 mg total) by mouth daily. 30 tablet 3   budesonide (PULMICORT) 0.5 MG/2ML nebulizer solution Take 2 mLs by nebulization daily.     cetirizine (ZYRTEC) 10 MG tablet Take 10 mg by mouth daily as needed for allergies.     Cholecalciferol 1.25 MG (50000 UT) capsule Take 50,000 Units by mouth once a week.     EPINEPHrine 0.3 mg/0.3 mL IJ SOAJ injection Inject 0.3 mg into the muscle as needed for anaphylaxis.     fluticasone (FLONASE) 50 MCG/ACT nasal spray Place 1 spray into both nostrils daily as needed for allergies.     fluticasone furoate-vilanterol (BREO ELLIPTA) 100-25 MCG/INH AEPB Inhale 1 puff into the lungs as needed for shortness of breath.      GNP BUDESONIDE NASAL SPRAY NA Place 1 spray into the nose daily.     lidocaine-prilocaine (EMLA) cream Apply 30 -45 mins prior to port access. 50 g 0   Multiple Vitamins-Minerals (CENTRUM ADULTS) TABS Take 1 tablet by mouth daily.     naproxen sodium (ALEVE) 220 MG tablet Take 220 mg by mouth daily as needed (knee pain).     NON FORMULARY Takes weekly allergy shots     ondansetron (ZOFRAN) 4 MG tablet Take 1 tablet (4 mg total) by mouth every 8 (eight) hours as needed for nausea or vomiting. 20 tablet 0   pantoprazole (PROTONIX) 40 MG tablet Take 40 mg by mouth every morning.     prochlorperazine (COMPAZINE) 10 MG tablet Take 1 tablet (10 mg total) by mouth every 6 (six) hours as needed for nausea or vomiting. 40 tablet 1   rosuvastatin (CRESTOR) 40 MG tablet Take 40 mg by mouth daily.     No current facility-administered medications for this visit.      Marland Kitchen  PHYSICAL EXAMINATION: ECOG PERFORMANCE STATUS: 0 - Asymptomatic  Vitals:   08/11/21 1005  BP: 135/86  Pulse: 85  Resp: 20  Temp: 97.8 F (36.6 C)  SpO2: 100%   Filed Weights   08/11/21 1005  Weight: 191 lb (86.6 kg)    Physical Exam HENT:     Head: Normocephalic and atraumatic.     Mouth/Throat:     Pharynx: No oropharyngeal exudate.  Eyes:     Pupils: Pupils are equal, round, and reactive to light.  Cardiovascular:     Rate and Rhythm: Normal rate and regular rhythm.  Pulmonary:     Effort: Pulmonary effort is normal. No respiratory distress.     Breath sounds: Normal breath sounds. No wheezing.  Abdominal:     General: Bowel sounds are normal. There is no distension.     Palpations: Abdomen is soft. There is no mass.     Tenderness: There is no abdominal tenderness. There is no guarding or rebound.  Musculoskeletal:        General: No tenderness. Normal range of motion.     Cervical back: Normal range of motion and neck supple.  Skin:    General: Skin is warm.  Neurological:     Mental Status: She is alert  and oriented to person, place, and time.  Psychiatric:        Mood and Affect: Affect normal.     LABORATORY DATA:  I have reviewed the data as listed Lab Results  Component Value Date   WBC 7.3 08/11/2021   HGB 11.1 (L) 08/11/2021   HCT 35.1 (L) 08/11/2021   MCV 82.8 08/11/2021  PLT 262 08/11/2021   Recent Labs    06/23/21 0920 07/16/21 0842 08/11/21 0908  NA 138 136 136  K 3.9 4.0 4.0  CL 107 106 107  CO2 '24 25 25  ' GLUCOSE 106* 106* 115*  BUN 19 15 24*  CREATININE 0.79 0.83 0.85  CALCIUM 8.9 8.7* 8.8*  GFRNONAA >60 >60 >60  PROT 6.8 6.9 6.9  ALBUMIN 4.0 4.0 3.9  AST '24 20 16  ' ALT '31 26 21  ' ALKPHOS 78 78 71  BILITOT 0.6 0.5 0.5    RADIOGRAPHIC STUDIES: I have personally reviewed the radiological images as listed and agreed with the findings in the report. No results found.  ASSESSMENT & PLAN:   Malignant neoplasm of lower-outer quadrant of left breast of female, estrogen receptor positive ([place]) # Left breast- CA- s/p mastectomy-  pT1c pN0 [Stage IA]Grade 3.  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  On adjuvant Herceptin. STABLE.   #Proceed with cycle #6 herceptin q3 W. Labs today reviewed;  acceptable for treatment today.  AUG 2022- LVEF- 56.8 %.Comparable to LVEF equals 63.8% on 03/05/2021;  MUGA scan every 3 to 4 months.   # Discussed the mechanism of action of aromatase inhibitors-with blocking of estrogen to prevent breast cancer.  Also discussed the potential side effects including but not limited to arthralgias hot flashes and increased risk of osteoporosis. Script for anastrazole sent. Post menopausal- hormonal status reviewed.  Start patient on anastrozole.  # Bil hands- dorsum hyperpigmentation continue urea cream 40%+ SA. STABLE.   # DISPOSITION: # herceptin today  # in 3 weeks- [mondays]-MD; labs- cbc/cmp; Herceptin-Dr.B  All questions were answered. The patient/family knows to call the clinic with any problems, questions or concerns.    Cammie Sickle, MD 08/11/2021 4:24 PM

## 2021-08-14 DIAGNOSIS — J301 Allergic rhinitis due to pollen: Secondary | ICD-10-CM | POA: Diagnosis not present

## 2021-08-15 DIAGNOSIS — J301 Allergic rhinitis due to pollen: Secondary | ICD-10-CM | POA: Diagnosis not present

## 2021-08-21 DIAGNOSIS — J301 Allergic rhinitis due to pollen: Secondary | ICD-10-CM | POA: Diagnosis not present

## 2021-08-28 DIAGNOSIS — J301 Allergic rhinitis due to pollen: Secondary | ICD-10-CM | POA: Diagnosis not present

## 2021-09-01 ENCOUNTER — Inpatient Hospital Stay: Payer: No Typology Code available for payment source

## 2021-09-01 ENCOUNTER — Inpatient Hospital Stay (HOSPITAL_BASED_OUTPATIENT_CLINIC_OR_DEPARTMENT_OTHER): Payer: No Typology Code available for payment source | Admitting: Internal Medicine

## 2021-09-01 ENCOUNTER — Other Ambulatory Visit: Payer: Self-pay

## 2021-09-01 DIAGNOSIS — C50512 Malignant neoplasm of lower-outer quadrant of left female breast: Secondary | ICD-10-CM

## 2021-09-01 DIAGNOSIS — Z5112 Encounter for antineoplastic immunotherapy: Secondary | ICD-10-CM | POA: Diagnosis not present

## 2021-09-01 DIAGNOSIS — Z170 Estrogen receptor positive status [ER+]: Secondary | ICD-10-CM | POA: Diagnosis not present

## 2021-09-01 DIAGNOSIS — I10 Essential (primary) hypertension: Secondary | ICD-10-CM | POA: Diagnosis not present

## 2021-09-01 DIAGNOSIS — L819 Disorder of pigmentation, unspecified: Secondary | ICD-10-CM | POA: Diagnosis not present

## 2021-09-01 DIAGNOSIS — J45909 Unspecified asthma, uncomplicated: Secondary | ICD-10-CM | POA: Diagnosis not present

## 2021-09-01 LAB — COMPREHENSIVE METABOLIC PANEL
ALT: 30 U/L (ref 0–44)
AST: 21 U/L (ref 15–41)
Albumin: 4 g/dL (ref 3.5–5.0)
Alkaline Phosphatase: 65 U/L (ref 38–126)
Anion gap: 3 — ABNORMAL LOW (ref 5–15)
BUN: 26 mg/dL — ABNORMAL HIGH (ref 6–20)
CO2: 26 mmol/L (ref 22–32)
Calcium: 8.5 mg/dL — ABNORMAL LOW (ref 8.9–10.3)
Chloride: 107 mmol/L (ref 98–111)
Creatinine, Ser: 0.89 mg/dL (ref 0.44–1.00)
GFR, Estimated: >60 mL/min (ref >60)
Glucose, Bld: 123 mg/dL — ABNORMAL HIGH (ref 70–99)
Potassium: 3.7 mmol/L (ref 3.5–5.1)
Sodium: 136 mmol/L (ref 135–145)
Total Bilirubin: 0.5 mg/dL (ref 0.3–1.2)
Total Protein: 6.8 g/dL (ref 6.5–8.1)

## 2021-09-01 LAB — CBC WITH DIFFERENTIAL/PLATELET
Abs Immature Granulocytes: 0.03 10*3/uL (ref 0.00–0.07)
Basophils Absolute: 0 10*3/uL (ref 0.0–0.1)
Basophils Relative: 0 %
Eosinophils Absolute: 0.2 10*3/uL (ref 0.0–0.5)
Eosinophils Relative: 3 %
HCT: 35.4 % — ABNORMAL LOW (ref 36.0–46.0)
Hemoglobin: 11.4 g/dL — ABNORMAL LOW (ref 12.0–15.0)
Immature Granulocytes: 1 %
Lymphocytes Relative: 31 %
Lymphs Abs: 1.6 10*3/uL (ref 0.7–4.0)
MCH: 26.5 pg (ref 26.0–34.0)
MCHC: 32.2 g/dL (ref 30.0–36.0)
MCV: 82.1 fL (ref 80.0–100.0)
Monocytes Absolute: 0.4 10*3/uL (ref 0.1–1.0)
Monocytes Relative: 8 %
Neutro Abs: 2.9 10*3/uL (ref 1.7–7.7)
Neutrophils Relative %: 57 %
Platelets: 246 10*3/uL (ref 150–400)
RBC: 4.31 MIL/uL (ref 3.87–5.11)
RDW: 15.5 % (ref 11.5–15.5)
WBC: 5 10*3/uL (ref 4.0–10.5)
nRBC: 0 % (ref 0.0–0.2)

## 2021-09-01 MED ORDER — SODIUM CHLORIDE 0.9% FLUSH
10.0000 mL | INTRAVENOUS | Status: DC | PRN
  Administered 2021-09-01: 10 mL via INTRAVENOUS
  Filled 2021-09-01: qty 10

## 2021-09-01 MED ORDER — TRASTUZUMAB-ANNS CHEMO 150 MG IV SOLR
6.0000 mg/kg | Freq: Once | INTRAVENOUS | Status: AC
  Administered 2021-09-01: 525 mg via INTRAVENOUS
  Filled 2021-09-01: qty 25

## 2021-09-01 MED ORDER — HEPARIN SOD (PORK) LOCK FLUSH 100 UNIT/ML IV SOLN
INTRAVENOUS | Status: AC
  Administered 2021-09-01: 500 [IU]
  Filled 2021-09-01: qty 5

## 2021-09-01 MED ORDER — HEPARIN SOD (PORK) LOCK FLUSH 100 UNIT/ML IV SOLN
500.0000 [IU] | Freq: Once | INTRAVENOUS | Status: DC
  Filled 2021-09-01: qty 5

## 2021-09-01 MED ORDER — DIPHENHYDRAMINE HCL 25 MG PO CAPS: 25.0000 mg | ORAL_CAPSULE | Freq: Once | ORAL | Status: DC

## 2021-09-01 MED ORDER — ACETAMINOPHEN 325 MG PO TABS
650.0000 mg | ORAL_TABLET | Freq: Once | ORAL | Status: AC
  Administered 2021-09-01: 650 mg via ORAL
  Filled 2021-09-01: qty 2

## 2021-09-01 MED ORDER — SODIUM CHLORIDE 0.9 % IV SOLN
Freq: Once | INTRAVENOUS | Status: AC
  Filled 2021-09-01: qty 250

## 2021-09-01 MED ORDER — HEPARIN SOD (PORK) LOCK FLUSH 100 UNIT/ML IV SOLN
500.0000 [IU] | Freq: Once | INTRAVENOUS | Status: AC | PRN
  Filled 2021-09-01: qty 5

## 2021-09-01 NOTE — Progress Notes (Signed)
one [place] NOTE  Patient Care Team: [name], MD as PCP - General (Internal Medicine) [name], RN as Registered Nurse  CHIEF COMPLAINTS/PURPOSE OF CONSULTATION: Breast cancer    Oncology History Overview Note  # April 2022-stage Ia mammary carcinoma ER/PR positive HER2 positive [TRIPLE positive]; negative margins s/p simple mastectomy with plan for immediate reconstruction [Dr.Rodenberg/[name]]; NO RT  # May 2nd, 2022- Taxol-Herceptin  # OCT 3rd, 2022- Anastrazole 46m/day [AUG 2022- BMD- WNL. ]  # MUGA scan- 64% [03/06/2021]-   # LMP- mid 2020; Uterine ablation- [id number]; # HTN; Asthma- well controlled [on allergy shots];.Marland Kitchen   Malignant neoplasm of lower-outer quadrant of left breast of female, estrogen receptor positive ([name]  12/25/2020 Initial [place]   Malignant neoplasm of lower-outer quadrant of left breast of female, estrogen receptor positive ([name]   02/26/2021 Cancer Staging   Staging form: Breast, AJCC 8th Edition - Pathologic: Stage IA (pT1c, pN0, cM0, G3, ER+, PR+, HER2+) - Signed by BCammie Sickle MD on 02/26/2021 Mitotic count score: Score 3 Histologic grading system: 3 grade system    03/10/2021 -  Chemotherapy   Patient is on Treatment Plan : BREAST Paclitaxel + Trastuzumab q7d / Trastuzumab q21d      Genetic Testing   Negative genetic testing. No pathogenic variants identified on the [facility] Multi-Cancer+RNA panel. 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 The report date is 04/29/2021.  The Multi-Cancer Panel + RNA offered by [facility] includes sequencing and/or deletion duplication testing of the following 84 genes: AIP, ALK, APC, ATM, AXIN2,BAP1,  BARD1, BLM, BMPR1A, BRCA1, BRCA2, BRIP1, CASR, CDC73, CDH1, CDK4, CDKN1B, CDKN1C, CDKN2A (p14ARF), CDKN2A (p16INK4a), CEBPA, CHEK2, CTNNA1, DICER1, DIS3L2, EGFR (c.2369C>T, p.Thr790Met variant only), EPCAM (Deletion/duplication testing only), FH, FLCN, GATA2, GPC3, GREM1  (Promoter region deletion/duplication testing only), HOXB13 (c.251G>A, p.Gly84Glu), HRAS, KIT, MAX, MEN1, MET, MITF (c.952G>A, p.Glu318Lys variant only), MLH1, MSH2, MSH3, MSH6, MUTYH, NBN, NF1, NF2, NTHL1, PALB2, PDGFRA, PHOX2B, PMS2, POLD1, POLE, POT1, PRKAR1A, PTCH1, PTEN, RAD50, RAD51C, RAD51D, RB1, RECQL4, RET, RUNX1, SDHAF2, SDHA (sequence changes only), SDHB, SDHC, SDHD, SMAD4, SMARCA4, SMARCB1, SMARCE1, STK11, SUFU, TERC, TERT, TMEM127, TP53, TSC1, TSC2, VHL, WRN and WT1.      HISTORY OF PRESENTING ILLNESS: Alone.  Ambulating independently. Judy Quaker89y.o.  female newly diagnosed breast cancer stage I ER/PR positive HER2/neu positive on adjuvant Herceptin; anastrazole is here for follow-up.  Patient denies any worsening hot flashes.  Denies any worsening joint pains.  Hyperpigmentation of the hands is improved.  Denies any swelling in the legs.  Review of Systems  Constitutional:  Negative for chills, diaphoresis, fever, malaise/fatigue and weight loss.  HENT:  Negative for nosebleeds and sore throat.   Eyes:  Negative for double vision.  Respiratory:  Negative for cough, hemoptysis, sputum production, shortness of breath and wheezing.   Cardiovascular:  Negative for chest pain, palpitations, orthopnea and leg swelling.  Gastrointestinal:  Negative for abdominal pain, blood in stool, constipation, diarrhea, heartburn, melena, nausea and vomiting.  Genitourinary:  Negative for dysuria, frequency and urgency.  Musculoskeletal:  Negative for back pain and joint pain.  Skin: Negative.  Negative for itching and rash.  Neurological:  Negative for dizziness, tingling, focal weakness, weakness and headaches.  Endo/Heme/Allergies:  Does not bruise/bleed easily.  Psychiatric/Behavioral:  Negative for depression. The patient is not nervous/anxious and does not have insomnia.     MEDICAL HISTORY:  Past Medical History:  [place] Date  . Asthma    well controlled  .  Breast cancer  ([place])   . Family history of adverse reaction to anesthesia    sister-hard time waking up  . Family history of breast cancer   . Family history of prostate cancer   . Family history of uterine cancer   . GERD (gastroesophageal reflux disease)   . Hypertension     SURGICAL HISTORY: Past Surgical History:  Procedure Laterality Date  . ABLATION    . BREAST BIOPSY Left 2011   Benign per pt  . BREAST BIOPSY Left 12/12/2020   3:30 3 cmfn, Q marker, path pending  . BREAST BIOPSY Left 12/12/2020   3:30 1 cmfn, Vision marker, path pending  . BREAST RECONSTRUCTION WITH PLACEMENT OF TISSUE EXPANDER AND FLEX HD (ACELLULAR HYDRATED DERMIS) Left 02/10/2021   Procedure: IMMEDIATE LEFT BREAST RECONSTRUCTION WITH PLACEMENT OF TISSUE EXPANDER AND FLEX HD (ACELLULAR HYDRATED DERMIS);  Surgeon: Wallace Going, DO;  Location: [facility];  Service: Plastics;  Laterality: Left;  . COLONOSCOPY  01/15/2020  . PORTACATH PLACEMENT Right 02/10/2021   Procedure: INSERTION PORT-A-CATH;  Surgeon: Ronny Bacon, MD;  Location: [facility];  Service: General;  Laterality: Right;  . REMOVAL OF TISSUE EXPANDER AND PLACEMENT OF IMPLANT Left 07/09/2021   Procedure: REMOVAL OF TISSUE EXPANDER AND PLACEMENT OF IMPLANT LEFT BREAST;  Surgeon: Wallace Going, DO;  Location: [place];  Service: Plastics;  Laterality: Left;  . SIMPLE MASTECTOMY WITH AXILLARY SENTINEL NODE BIOPSY Left 02/10/2021   Procedure: SIMPLE MASTECTOMY WITH AXILLARY SENTINEL NODE BIOPSY;  Surgeon: Ronny Bacon, MD;  Location: [facility];  Service: General;  Laterality: Left;    SOCIAL HISTORY: Social History   Socioeconomic History  . Marital status: Married    Spouse name: Not on file  . Number of children: Not on file  . Years of education: Not on file  . Highest education level: Not on file  Occupational History  . Not on file  Tobacco Use  . Smoking status: Never  . Smokeless tobacco: Never  Vaping Use  . Vaping Use:  Never used  Substance and Sexual Activity  . Alcohol use: Not Currently  . Drug use: Never  . Sexual activity: Not on file  Other Topics Concern  . Not on file  Social History Narrative   Lives in [place] with husband; kids- college. Works for [employer]- working from home. No smoking or alcohol.    Social Determinants of Health   Financial Resource Strain: Not on file  Food Insecurity: Not on file  Transportation Needs: Not on file  Physical Activity: Not on file  Stress: Not on file  Social Connections: Not on file  Intimate Partner Violence: Not on file    FAMILY HISTORY: Family History  Problem Relation Age of Onset  . Hypertension Mother   . Diabetes Mother   . Hypertension Father   . Diabetes Father   . Cancer Father        prostate cancer-70s  . Cancer Maternal Grandmother        uterine cancer  . Breast cancer Sister        in in 42s.     ALLERGIES:  is allergic to shrimp extract allergy skin test and other.  MEDICATIONS:  Current Outpatient Medications  Medication Sig Dispense Refill  . albuterol (VENTOLIN HFA) 108 (90 Base) MCG/ACT inhaler Inhale 1 puff into the lungs every 6 (six) hours as needed for shortness of breath.    Marland Kitchen amLODipine (NORVASC) 10 MG tablet Take 10  mg by mouth every morning.    . budesonide (PULMICORT) 0.5 MG/2ML nebulizer solution Take 2 mLs by nebulization daily.    . cetirizine (ZYRTEC) 10 MG tablet Take 10 mg by mouth daily as needed for allergies.    . Cholecalciferol 1.25 MG (50000 UT) capsule Take 50,000 Units by mouth once a week.    Marland Kitchen EPINEPHrine 0.3 mg/0.3 mL IJ SOAJ injection Inject 0.3 mg into the muscle as needed for anaphylaxis.    . fluticasone (FLONASE) 50 MCG/ACT nasal spray Place 1 spray into both nostrils daily as needed for allergies.    . fluticasone furoate-vilanterol (BREO ELLIPTA) 100-25 MCG/INH AEPB Inhale 1 puff into the lungs as needed for shortness of breath.    . GNP BUDESONIDE NASAL SPRAY  NA Place 1 spray into the nose daily.    Marland Kitchen lidocaine-prilocaine (EMLA) cream Apply 30 -45 mins prior to port access. 50 g 0  . meloxicam (MOBIC) 15 MG tablet Take 15 mg by mouth daily.    . Multiple Vitamins-Minerals (CENTRUM ADULTS) TABS Take 1 tablet by mouth daily.    . NON FORMULARY Takes weekly allergy shots    . pantoprazole (PROTONIX) 40 MG tablet Take 40 mg by mouth every morning.    . rosuvastatin (CRESTOR) 40 MG tablet Take 40 mg by mouth daily.    Marland Kitchen anastrozole (ARIMIDEX) 1 MG tablet Take 1 tablet (1 mg total) by mouth daily. (Patient not taking: Reported on 09/01/2021) 30 tablet 3  . ondansetron (ZOFRAN) 4 MG tablet Take 1 tablet (4 mg total) by mouth every 8 (eight) hours as needed for nausea or vomiting. (Patient not taking: Reported on 09/01/2021) 20 tablet 0  . prochlorperazine (COMPAZINE) 10 MG tablet Take 1 tablet (10 mg total) by mouth every 6 (six) hours as needed for nausea or vomiting. (Patient not taking: Reported on 09/01/2021) 40 tablet 1   No current facility-administered medications for this visit.   Facility-Administered Medications Ordered in Other Visits  Medication Dose Route Frequency Provider Last Rate Last Admin  . diphenhydrAMINE (BENADRYL) capsule 25 mg  25 mg Oral Once Charlaine Dalton R, MD      . heparin lock flush 100 UNIT/ML injection           . heparin lock flush 100 unit/mL  500 Units Intracatheter Once PRN Cammie Sickle, MD      . trastuzumab-anns [facility]) 525 mg in sodium chloride 0.9 % 250 mL chemo infusion  6 mg/kg (Treatment Plan Recorded) Intravenous Once Cammie Sickle, MD          .  PHYSICAL EXAMINATION: ECOG PERFORMANCE STATUS: 0 - Asymptomatic  Vitals:   09/01/21 0916  BP: 133/85  Pulse: 75  Resp: 18  Temp: 97.8 F (36.6 C)  SpO2: 100%   Filed Weights   09/01/21 0916  Weight: 191 lb 11.2 oz (87 kg)    Physical Exam HENT:     Head: Normocephalic and atraumatic.     Mouth/Throat:     Pharynx: No  oropharyngeal exudate.  Eyes:     Pupils: Pupils are equal, round, and reactive to light.  Cardiovascular:     Rate and Rhythm: Normal rate and regular rhythm.  Pulmonary:     Effort: Pulmonary effort is normal. No respiratory distress.     Breath sounds: Normal breath sounds. No wheezing.  Abdominal:     General: Bowel sounds are normal. There is no distension.     Palpations: Abdomen is soft. There is  no mass.     Tenderness: There is no abdominal tenderness. There is no guarding or rebound.  Musculoskeletal:        General: No tenderness. Normal range of motion.     Cervical back: Normal range of motion and neck supple.  Skin:    General: Skin is warm.  Neurological:     Mental Status: She is alert and oriented to person, place, and time.  Psychiatric:        Mood and Affect: Affect normal.     LABORATORY DATA:  I have reviewed the data as listed Lab Results  Component Value Date   WBC 5.0 09/01/2021   HGB 11.4 (L) 09/01/2021   HCT 35.4 (L) 09/01/2021   MCV 82.1 09/01/2021   PLT 246 09/01/2021   Recent Labs    07/16/21 0842 08/11/21 0908 09/01/21 0853  NA 136 136 136  K 4.0 4.0 3.7  CL 106 107 107  CO2 '25 25 26  ' GLUCOSE 106* 115* 123*  BUN 15 24* 26*  CREATININE 0.83 0.85 0.89  CALCIUM 8.7* 8.8* 8.5*  GFRNONAA >60 >60 >60  PROT 6.9 6.9 6.8  ALBUMIN 4.0 3.9 4.0  AST '20 16 21  ' ALT '26 21 30  ' ALKPHOS 78 71 65  BILITOT 0.5 0.5 0.5    RADIOGRAPHIC STUDIES: I have personally reviewed the radiological images as listed and agreed with the findings in the report. No results found.  ASSESSMENT & PLAN:   Malignant neoplasm of lower-outer quadrant of left breast of female, estrogen receptor positive ([place]) # Left breast- CA- s/p mastectomy-  pT1c pN0 [Stage IA]Grade 3.  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  On adjuvant Herceptin. Started anastrozole [Oct 7824]; STABLE.   #Proceed with  Adjuvant/maintenace herceptin q3 W. Labs today reviewed;  acceptable for  treatment today.  Will discontinue Benadryl.  AUG 2022- LVEF- 56.8 %.Comparable to LVEF equals 63.8% on 03/05/2021;  MUGA scan in mid dec 2022.   # Bil hands- dorsum hyperpigmentation continue urea cream 40%+ SA/improved-. STABLE.   # DISPOSITION: # herceptin today  # in 3 weeks- [mondays]-MD; labs- cbc/cmp; Herceptin-Dr.B  All questions were answered. The patient/family knows to call the clinic with any problems, questions or concerns.    Cammie Sickle, MD 09/01/2021 10:10 AM

## 2021-09-01 NOTE — Assessment & Plan Note (Addendum)
#  Left breast- CA- s/p mastectomy-  pT1c pN0 [Stage IA]Grade 3.  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  On adjuvant Herceptin. Started anastrozole [Oct 2022]; STABLE.   #Proceed with  Adjuvant/maintenace herceptin q3 W. Labs today reviewed;  acceptable for treatment today.  Will discontinue Benadryl.  AUG 2022- LVEF- 56.8 %.Comparable to LVEF equals 63.8% on 03/05/2021;  MUGA scan in mid dec 2022.   # Bil hands- dorsum hyperpigmentation continue urea cream 40%+ SA/improved-. STABLE.   # DISPOSITION: # herceptin today  # in 3 weeks- [mondays]-MD; labs- cbc/cmp; Herceptin;Dr.B  

## 2021-09-01 NOTE — Patient Instructions (Signed)
[facility]  Discharge Instructions: Thank you for choosing [facility] to provide your oncology and hematology care.  If you have a lab appointment with the [facility], please go directly to the [facility] and check in at the registration area.  Wear comfortable clothing and clothing appropriate for easy access to any Portacath or PICC line.   We strive to give you quality time with your provider. You may need to reschedule your appointment if you arrive late (15 or more minutes).  Arriving late affects you and other patients whose appointments are after yours.  Also, if you miss three or more appointments without notifying the office, you may be dismissed from the clinic at the provider's discretion.      For prescription refill requests, have your pharmacy contact our office and allow 72 hours for refills to be completed.    Today you received the following chemotherapy and/or immunotherapy agents [place]      To help prevent nausea and vomiting after your treatment, we encourage you to take your nausea medication as directed.  BELOW ARE SYMPTOMS THAT SHOULD BE REPORTED IMMEDIATELY: *FEVER GREATER THAN 100.4 F (38 C) OR HIGHER *CHILLS OR SWEATING *NAUSEA AND VOMITING THAT IS NOT CONTROLLED WITH YOUR NAUSEA MEDICATION *UNUSUAL SHORTNESS OF BREATH *UNUSUAL BRUISING OR BLEEDING *URINARY PROBLEMS (pain or burning when urinating, or frequent urination) *BOWEL PROBLEMS (unusual diarrhea, constipation, pain near the anus) TENDERNESS IN MOUTH AND THROAT WITH OR WITHOUT PRESENCE OF ULCERS (sore throat, sores in mouth, or a toothache) UNUSUAL RASH, SWELLING OR PAIN  UNUSUAL VAGINAL DISCHARGE OR ITCHING   Items with * indicate a potential emergency and should be followed up as soon as possible or go to the Emergency Department if any problems should occur.  Please show the CHEMOTHERAPY ALERT CARD or IMMUNOTHERAPY ALERT CARD at check-in to  the Emergency Department and triage nurse.  Should you have questions after your visit or need to cancel or reschedule your appointment, please contact [facility]  [phone] and follow the prompts.  Office hours are 8:00 a.m. to 4:30 p.m. Monday - Friday. Please note that voicemails left after 4:00 p.m. may not be returned until the following business day.  We are closed weekends and major holidays. You have access to a nurse at all times for urgent questions. Please call the main number to the clinic [phone] and follow the prompts.  For any non-urgent questions, you may also contact your provider using MyChart. We now offer e-Visits for anyone 37 and older to request care online for non-urgent symptoms. For details visit [email or web address].   Also download the MyChart app! Go to the app store, search "MyChart", open the app, select [facility], and log in with your MyChart username and password.  Due to Covid, a mask is required upon entering the hospital/clinic. If you do not have a mask, one will be given to you upon arrival. For doctor visits, patients may have 1 support person aged 48 or older with them. For treatment visits, patients cannot have anyone with them due to current Covid guidelines and our immunocompromised population.

## 2021-09-04 DIAGNOSIS — J301 Allergic rhinitis due to pollen: Secondary | ICD-10-CM | POA: Diagnosis not present

## 2021-09-11 DIAGNOSIS — J301 Allergic rhinitis due to pollen: Secondary | ICD-10-CM | POA: Diagnosis not present

## 2021-09-11 DIAGNOSIS — I10 Essential (primary) hypertension: Secondary | ICD-10-CM | POA: Diagnosis not present

## 2021-09-11 DIAGNOSIS — D508 Other iron deficiency anemias: Secondary | ICD-10-CM | POA: Diagnosis not present

## 2021-09-11 DIAGNOSIS — R7302 Impaired glucose tolerance (oral): Secondary | ICD-10-CM | POA: Diagnosis not present

## 2021-09-11 DIAGNOSIS — E782 Mixed hyperlipidemia: Secondary | ICD-10-CM | POA: Diagnosis not present

## 2021-09-18 DIAGNOSIS — J301 Allergic rhinitis due to pollen: Secondary | ICD-10-CM | POA: Diagnosis not present

## 2021-09-22 ENCOUNTER — Inpatient Hospital Stay: Payer: No Typology Code available for payment source | Attending: Internal Medicine

## 2021-09-22 ENCOUNTER — Inpatient Hospital Stay (HOSPITAL_BASED_OUTPATIENT_CLINIC_OR_DEPARTMENT_OTHER): Payer: No Typology Code available for payment source | Admitting: Internal Medicine

## 2021-09-22 ENCOUNTER — Inpatient Hospital Stay: Payer: No Typology Code available for payment source

## 2021-09-22 ENCOUNTER — Encounter: Payer: Self-pay | Admitting: Internal Medicine

## 2021-09-22 ENCOUNTER — Other Ambulatory Visit: Payer: Self-pay

## 2021-09-22 DIAGNOSIS — J45909 Unspecified asthma, uncomplicated: Secondary | ICD-10-CM | POA: Diagnosis not present

## 2021-09-22 DIAGNOSIS — Z5112 Encounter for antineoplastic immunotherapy: Secondary | ICD-10-CM | POA: Diagnosis not present

## 2021-09-22 DIAGNOSIS — Z170 Estrogen receptor positive status [ER+]: Secondary | ICD-10-CM | POA: Diagnosis not present

## 2021-09-22 DIAGNOSIS — C50512 Malignant neoplasm of lower-outer quadrant of left female breast: Secondary | ICD-10-CM | POA: Diagnosis not present

## 2021-09-22 DIAGNOSIS — I10 Essential (primary) hypertension: Secondary | ICD-10-CM | POA: Insufficient documentation

## 2021-09-22 LAB — CBC WITH DIFFERENTIAL/PLATELET
Abs Immature Granulocytes: 0.01 10*3/uL (ref 0.00–0.07)
Basophils Absolute: 0 10*3/uL (ref 0.0–0.1)
Basophils Relative: 0 %
Eosinophils Absolute: 0.1 10*3/uL (ref 0.0–0.5)
Eosinophils Relative: 1 %
HCT: 37.1 % (ref 36.0–46.0)
Hemoglobin: 11.7 g/dL — ABNORMAL LOW (ref 12.0–15.0)
Immature Granulocytes: 0 %
Lymphocytes Relative: 20 %
Lymphs Abs: 1.2 10*3/uL (ref 0.7–4.0)
MCH: 25.4 pg — ABNORMAL LOW (ref 26.0–34.0)
MCHC: 31.5 g/dL (ref 30.0–36.0)
MCV: 80.7 fL (ref 80.0–100.0)
Monocytes Absolute: 0.7 10*3/uL (ref 0.1–1.0)
Monocytes Relative: 12 %
Neutro Abs: 3.8 10*3/uL (ref 1.7–7.7)
Neutrophils Relative %: 67 %
Platelets: 238 10*3/uL (ref 150–400)
RBC: 4.6 MIL/uL (ref 3.87–5.11)
RDW: 15.9 % — ABNORMAL HIGH (ref 11.5–15.5)
WBC: 5.7 10*3/uL (ref 4.0–10.5)
nRBC: 0 % (ref 0.0–0.2)

## 2021-09-22 LAB — COMPREHENSIVE METABOLIC PANEL
ALT: 33 U/L (ref 0–44)
AST: 23 U/L (ref 15–41)
Albumin: 4 g/dL (ref 3.5–5.0)
Alkaline Phosphatase: 73 U/L (ref 38–126)
Anion gap: 5 (ref 5–15)
BUN: 20 mg/dL (ref 6–20)
CO2: 27 mmol/L (ref 22–32)
Calcium: 9 mg/dL (ref 8.9–10.3)
Chloride: 106 mmol/L (ref 98–111)
Creatinine, Ser: 1.01 mg/dL — ABNORMAL HIGH (ref 0.44–1.00)
GFR, Estimated: >60 mL/min (ref >60)
Glucose, Bld: 105 mg/dL — ABNORMAL HIGH (ref 70–99)
Potassium: 4.1 mmol/L (ref 3.5–5.1)
Sodium: 138 mmol/L (ref 135–145)
Total Bilirubin: 0.4 mg/dL (ref 0.3–1.2)
Total Protein: 7 g/dL (ref 6.5–8.1)

## 2021-09-22 MED ORDER — HEPARIN SOD (PORK) LOCK FLUSH 100 UNIT/ML IV SOLN
500.0000 [IU] | Freq: Once | INTRAVENOUS | Status: AC | PRN
  Administered 2021-09-22: 500 [IU]
  Filled 2021-09-22: qty 5

## 2021-09-22 MED ORDER — TRASTUZUMAB-ANNS CHEMO 150 MG IV SOLR
6.0000 mg/kg | Freq: Once | INTRAVENOUS | Status: AC
  Administered 2021-09-22: 525 mg via INTRAVENOUS
  Filled 2021-09-22: qty 25

## 2021-09-22 MED ORDER — ACETAMINOPHEN 325 MG PO TABS
650.0000 mg | ORAL_TABLET | Freq: Once | ORAL | Status: AC
  Administered 2021-09-22: 650 mg via ORAL
  Filled 2021-09-22: qty 2

## 2021-09-22 MED ORDER — SODIUM CHLORIDE 0.9 % IV SOLN
Freq: Once | INTRAVENOUS | Status: AC
  Filled 2021-09-22: qty 250

## 2021-09-22 NOTE — Patient Instructions (Signed)
[facility]  Discharge Instructions: Thank you for choosing [facility] to provide your oncology and hematology care.  If you have a lab appointment with the Cancer Center, please go directly to the Cancer Center and check in at the registration area.  Wear comfortable clothing and clothing appropriate for easy access to any Portacath or PICC line.   We strive to give you quality time with your provider. You may need to reschedule your appointment if you arrive late (15 or more minutes).  Arriving late affects you and other patients whose appointments are after yours.  Also, if you miss three or more appointments without notifying the office, you may be dismissed from the clinic at the provider's discretion.      For prescription refill requests, have your pharmacy contact our office and allow 72 hours for refills to be completed.      To help prevent nausea and vomiting after your treatment, we encourage you to take your nausea medication as directed.  BELOW ARE SYMPTOMS THAT SHOULD BE REPORTED IMMEDIATELY: *FEVER GREATER THAN 100.4 F (38 C) OR HIGHER *CHILLS OR SWEATING *NAUSEA AND VOMITING THAT IS NOT CONTROLLED WITH YOUR NAUSEA MEDICATION *UNUSUAL SHORTNESS OF BREATH *UNUSUAL BRUISING OR BLEEDING *URINARY PROBLEMS (pain or burning when urinating, or frequent urination) *BOWEL PROBLEMS (unusual diarrhea, constipation, pain near the anus) TENDERNESS IN MOUTH AND THROAT WITH OR WITHOUT PRESENCE OF ULCERS (sore throat, sores in mouth, or a toothache) UNUSUAL RASH, SWELLING OR PAIN  UNUSUAL VAGINAL DISCHARGE OR ITCHING   Items with * indicate a potential emergency and should be followed up as soon as possible or go to the Emergency Department if any problems should occur.  Please show the CHEMOTHERAPY ALERT CARD or IMMUNOTHERAPY ALERT CARD at check-in to the Emergency Department and triage nurse.  Should you have questions after your  visit or need to cancel or reschedule your appointment, please contact [facility]  [phone] and follow the prompts.  Office hours are 8:00 a.m. to 4:30 p.m. Monday - Friday. Please note that voicemails left after 4:00 p.m. may not be returned until the following business day.  We are closed weekends and major holidays. You have access to a nurse at all times for urgent questions. Please call the main number to the clinic [phone] and follow the prompts.  For any non-urgent questions, you may also contact your provider using MyChart. We now offer e-Visits for anyone 18 and older to request care online for non-urgent symptoms. For details visit [email or web address].   Also download the MyChart app! Go to the app store, search "MyChart", open the app, select [facility], and log in with your MyChart username and password.  Due to Covid, a mask is required upon entering the hospital/clinic. If you do not have a mask, one will be given to you upon arrival. For doctor visits, patients may have 1 support person aged 18 or older with them. For treatment visits, patients cannot have anyone with them due to current Covid guidelines and our immunocompromised population.  

## 2021-09-22 NOTE — Progress Notes (Signed)
[facility] NOTE  Patient Care Team: [name], MD as PCP - General (Internal Medicine) [name], RN as Registered Nurse  CHIEF COMPLAINTS/PURPOSE OF CONSULTATION: Breast cancer    Oncology History Overview Note  # April 2022-stage Ia mammary carcinoma ER/PR positive HER2 positive [TRIPLE positive]; negative margins s/p simple mastectomy with plan for immediate reconstruction [Dr.Rodenberg/[name]]; NO RT  # May 2nd, 2022- Taxol-Herceptin  # OCT 3rd, 2022- Anastrazole 82m/day [AUG 2022- BMD- WNL. ]  # MUGA scan- 64% [03/06/2021]-   # LMP- mid 2020; Uterine ablation- [phone]; # HTN; Asthma- well controlled [on allergy shots];.Marland Kitchen   Malignant neoplasm of lower-outer quadrant of left breast of female, estrogen receptor positive ([facility]  12/25/2020 Initial Diagnosis   Malignant neoplasm of lower-outer quadrant of left breast of female, estrogen receptor positive ([place]   02/26/2021 Cancer Staging   Staging form: Breast, AJCC 8th Edition - Pathologic: Stage IA (pT1c, pN0, cM0, G3, ER+, PR+, HER2+) - Signed by BCammie Sickle MD on 02/26/2021 Mitotic count score: Score 3 Histologic grading system: 3 grade system    03/10/2021 -  Chemotherapy   Patient is on Treatment Plan : BREAST Paclitaxel + Trastuzumab q7d / Trastuzumab q21d      Genetic Testing   Negative genetic testing. No pathogenic variants identified on the [facility] Multi-Cancer+RNA panel. 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 The report date is 04/29/2021.  The Multi-Cancer Panel + RNA offered by [facility] includes sequencing and/or deletion duplication testing of the following 84 genes: AIP, ALK, APC, ATM, AXIN2,BAP1,  BARD1, BLM, BMPR1A, BRCA1, BRCA2, BRIP1, CASR, CDC73, CDH1, CDK4, CDKN1B, CDKN1C, CDKN2A (p14ARF), CDKN2A (p16INK4a), CEBPA, CHEK2, CTNNA1, DICER1, DIS3L2, EGFR (c.2369C>T, p.Thr790Met variant only), EPCAM (Deletion/duplication testing only), FH, FLCN, GATA2, GPC3, GREM1  (Promoter region deletion/duplication testing only), HOXB13 (c.251G>A, p.Gly84Glu), HRAS, KIT, MAX, MEN1, MET, MITF (c.952G>A, p.Glu318Lys variant only), MLH1, MSH2, MSH3, MSH6, MUTYH, NBN, NF1, NF2, NTHL1, PALB2, PDGFRA, PHOX2B, PMS2, POLD1, POLE, POT1, PRKAR1A, PTCH1, PTEN, RAD50, RAD51C, RAD51D, RB1, RECQL4, RET, RUNX1, SDHAF2, SDHA (sequence changes only), SDHB, SDHC, SDHD, SMAD4, SMARCA4, SMARCB1, SMARCE1, STK11, SUFU, TERC, TERT, TMEM127, TP53, TSC1, TSC2, VHL, WRN and WT1.      HISTORY OF PRESENTING ILLNESS: Alone.  Ambulating independently. Judy [id number].  female newly diagnosed breast cancer stage I ER/PR positive HER2/neu positive on adjuvant Herceptin; anastrazole is here for follow-up.  No worsening hot flashes or joint pains or shortness of breath cough.    Review of Systems  Constitutional:  Negative for chills, diaphoresis, fever, malaise/fatigue and weight loss.  HENT:  Negative for nosebleeds and sore throat.   Eyes:  Negative for double vision.  Respiratory:  Negative for cough, hemoptysis, sputum production, shortness of breath and wheezing.   Cardiovascular:  Negative for chest pain, palpitations, orthopnea and leg swelling.  Gastrointestinal:  Negative for abdominal pain, blood in stool, constipation, diarrhea, heartburn, melena, nausea and vomiting.  Genitourinary:  Negative for dysuria, frequency and urgency.  Musculoskeletal:  Negative for back pain and joint pain.  Skin: Negative.  Negative for itching and rash.  Neurological:  Negative for dizziness, tingling, focal weakness, weakness and headaches.  Endo/Heme/Allergies:  Does not bruise/bleed easily.  Psychiatric/Behavioral:  Negative for depression. The patient is not nervous/anxious and does not have insomnia.     MEDICAL HISTORY:  Past Medical History:  Diagnosis Date   Asthma    well controlled   Breast cancer ([place]    Family history of adverse  reaction to anesthesia    sister-hard time waking  up   Family history of breast cancer    Family history of prostate cancer    Family history of uterine cancer    GERD (gastroesophageal reflux disease)    Hypertension     SURGICAL HISTORY: Past Surgical History:  Procedure Laterality Date   ABLATION     BREAST BIOPSY Left 2011   Benign per pt   BREAST BIOPSY Left 12/12/2020   3:30 3 cmfn, Q marker, path pending   BREAST BIOPSY Left 12/12/2020   3:30 1 cmfn, Vision marker, path pending   BREAST RECONSTRUCTION WITH PLACEMENT OF TISSUE EXPANDER AND FLEX HD (ACELLULAR HYDRATED DERMIS) Left 02/10/2021   Procedure: IMMEDIATE LEFT BREAST RECONSTRUCTION WITH PLACEMENT OF TISSUE EXPANDER AND FLEX HD (ACELLULAR HYDRATED DERMIS);  Surgeon: Wallace Going, DO;  Location: [facility];  Service: Plastics;  Laterality: Left;   COLONOSCOPY  01/15/2020   PORTACATH PLACEMENT Right 02/10/2021   Procedure: INSERTION PORT-A-CATH;  Surgeon: Ronny Bacon, MD;  Location: [facility];  Service: General;  Laterality: Right;   REMOVAL OF TISSUE EXPANDER AND PLACEMENT OF IMPLANT Left 07/09/2021   Procedure: REMOVAL OF TISSUE EXPANDER AND PLACEMENT OF IMPLANT LEFT BREAST;  Surgeon: Wallace Going, DO;  Location: [facility];  Service: Plastics;  Laterality: Left;   SIMPLE MASTECTOMY WITH AXILLARY SENTINEL NODE BIOPSY Left 02/10/2021   Procedure: SIMPLE MASTECTOMY WITH AXILLARY SENTINEL NODE BIOPSY;  Surgeon: Ronny Bacon, MD;  Location: [facility];  Service: General;  Laterality: Left;    SOCIAL HISTORY: Social History   Socioeconomic History   Marital status: Married    Spouse name: Not on file   Number of children: Not on file   Years of education: Not on file   Highest education level: Not on file  Occupational History   Not on file  Tobacco Use   Smoking status: Never   Smokeless tobacco: Never  Vaping Use   Vaping Use: Never used  Substance and Sexual Activity   Alcohol use: Not Currently   Drug use: Never   Sexual  activity: Not on file  Other Topics Concern   Not on file  Social History Narrative   Lives in [place] with husband; kids- college. Works for [employer]- working from home. No smoking or alcohol.    Social Determinants of Health   Financial Resource Strain: Not on file  Food Insecurity: Not on file  Transportation Needs: Not on file  Physical Activity: Not on file  Stress: Not on file  Social Connections: Not on file  Intimate Partner Violence: Not on file    FAMILY HISTORY: Family History  Problem Relation Age of Onset   Hypertension Mother    Diabetes Mother    Hypertension Father    Diabetes Father    Cancer Father        prostate cancer-70s   Cancer Maternal Grandmother        uterine cancer   Breast cancer Sister        in in 55s.     ALLERGIES:  is allergic to shrimp extract allergy skin test and other.  MEDICATIONS:  Current Outpatient Medications  Medication Sig Dispense Refill   albuterol (VENTOLIN HFA) 108 (90 Base) MCG/ACT inhaler Inhale 1 puff into the lungs every 6 (six) hours as needed for shortness of breath.     amLODipine (NORVASC) 10 MG tablet Take 10 mg by mouth every morning.     anastrozole (  ARIMIDEX) 1 MG tablet Take 1 tablet (1 mg total) by mouth daily. 30 tablet 3   budesonide (PULMICORT) 0.5 MG/2ML nebulizer solution Take 2 mLs by nebulization daily.     cetirizine (ZYRTEC) 10 MG tablet Take 10 mg by mouth daily as needed for allergies.     Cholecalciferol 1.25 MG (50000 UT) capsule Take 50,000 Units by mouth once a week.     EPINEPHrine 0.3 mg/0.3 mL IJ SOAJ injection Inject 0.3 mg into the muscle as needed for anaphylaxis.     fluticasone (FLONASE) 50 MCG/ACT nasal spray Place 1 spray into both nostrils daily as needed for allergies.     fluticasone furoate-vilanterol (BREO ELLIPTA) 100-25 MCG/INH AEPB Inhale 1 puff into the lungs as needed for shortness of breath.     GNP BUDESONIDE NASAL SPRAY NA Place 1 spray into the  nose daily.     lidocaine-prilocaine (EMLA) cream Apply 30 -45 mins prior to port access. 50 g 0   meloxicam (MOBIC) 15 MG tablet Take 15 mg by mouth daily.     Multiple Vitamins-Minerals (CENTRUM ADULTS) TABS Take 1 tablet by mouth daily.     NON FORMULARY Takes weekly allergy shots     pantoprazole (PROTONIX) 40 MG tablet Take 40 mg by mouth every morning.     rosuvastatin (CRESTOR) 40 MG tablet Take 40 mg by mouth daily.     ondansetron (ZOFRAN) 4 MG tablet Take 1 tablet (4 mg total) by mouth every 8 (eight) hours as needed for nausea or vomiting. (Patient not taking: No sig reported) 20 tablet 0   prochlorperazine (COMPAZINE) 10 MG tablet Take 1 tablet (10 mg total) by mouth every 6 (six) hours as needed for nausea or vomiting. (Patient not taking: No sig reported) 40 tablet 1   No current facility-administered medications for this visit.      Marland Kitchen  PHYSICAL EXAMINATION: ECOG PERFORMANCE STATUS: 0 - Asymptomatic  Vitals:   09/22/21 0841  BP: 133/84  Pulse: 82  Resp: 17  Temp: 97.8 F (36.6 C)  SpO2: 100%   Filed Weights   09/22/21 0841  Weight: 196 lb (88.9 kg)    Physical Exam HENT:     Head: Normocephalic and atraumatic.     Mouth/Throat:     Pharynx: No oropharyngeal exudate.  Eyes:     Pupils: Pupils are equal, round, and reactive to light.  Cardiovascular:     Rate and Rhythm: Normal rate and regular rhythm.  Pulmonary:     Effort: Pulmonary effort is normal. No respiratory distress.     Breath sounds: Normal breath sounds. No wheezing.  Abdominal:     General: Bowel sounds are normal. There is no distension.     Palpations: Abdomen is soft. There is no mass.     Tenderness: There is no abdominal tenderness. There is no guarding or rebound.  Musculoskeletal:        General: No tenderness. Normal range of motion.     Cervical back: Normal range of motion and neck supple.  Skin:    General: Skin is warm.  Neurological:     Mental Status: She is alert and  oriented to person, place, and time.  Psychiatric:        Mood and Affect: Affect normal.     LABORATORY DATA:  I have reviewed the data as listed Lab Results  Component Value Date   WBC 5.7 09/22/2021   HGB 11.7 (L) 09/22/2021   HCT 37.1 09/22/2021  MCV 80.7 09/22/2021   PLT 238 09/22/2021   Recent Labs    08/11/21 0908 09/01/21 0853 09/22/21 0827  NA 136 136 138  K 4.0 3.7 4.1  CL 107 107 106  CO2 '25 26 27  ' GLUCOSE 115* 123* 105*  BUN 24* 26* 20  CREATININE 0.85 0.89 1.01*  CALCIUM 8.8* 8.5* 9.0  GFRNONAA >60 >60 >60  PROT 6.9 6.8 7.0  ALBUMIN 3.9 4.0 4.0  AST '16 21 23  ' ALT 21 30 33  ALKPHOS 71 65 73  BILITOT 0.5 0.5 0.4    RADIOGRAPHIC STUDIES: I have personally reviewed the radiological images as listed and agreed with the findings in the report. No results found.  ASSESSMENT & PLAN:   Malignant neoplasm of lower-outer quadrant of left breast of female, estrogen receptor positive ([place]) # Left breast- CA- s/p mastectomy-  pT1c pN0 [Stage IA]Grade 3.  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  On adjuvant Herceptin. Started anastrozole [Oct 3014]; STABLE.   #Proceed with  Adjuvant/maintenace herceptin q3 W. Labs today reviewed;  acceptable for treatment today.  Will discontinue Benadryl.  AUG 2022- LVEF- 56.8 %.Comparable to LVEF equals 63.8% on 03/05/2021;  MUGA scan in mid dec 2022.   # Bil hands- dorsum hyperpigmentation continue urea cream 40%+ SA/improved-. STABLE.   # DISPOSITION: # herceptin today  # in 3 weeks- [mondays]-MD; labs- cbc/cmp; Herceptin;Dr.B  All questions were answered. The patient/family knows to call the clinic with any problems, questions or concerns.    Cammie Sickle, MD 09/22/2021 12:37 PM

## 2021-09-22 NOTE — Assessment & Plan Note (Addendum)
#  Left breast- CA- s/p mastectomy-  pT1c pN0 [Stage IA]Grade 3.  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  On adjuvant Herceptin. Started anastrozole [Oct 4098]; STABLE.   #Proceed with  Adjuvant/maintenace herceptin q3 W. Labs today reviewed;  acceptable for treatment today.  Will discontinue Benadryl.  AUG 2022- LVEF- 56.8 %.Comparable to LVEF equals 63.8% on 03/05/2021;  MUGA scan in mid dec 2022.   # Bil hands- dorsum hyperpigmentation continue urea cream 40%+ SA/improved-. STABLE.   # DISPOSITION: # herceptin today  # in 3 weeks- [mondays]-MD; labs- cbc/cmp; Herceptin;Dr.B

## 2021-09-22 NOTE — Progress Notes (Signed)
Patient here for oncology follow-up appointment, expresses no complaints or concerns at this time.    

## 2021-09-25 DIAGNOSIS — J301 Allergic rhinitis due to pollen: Secondary | ICD-10-CM | POA: Diagnosis not present

## 2021-10-09 DIAGNOSIS — J301 Allergic rhinitis due to pollen: Secondary | ICD-10-CM | POA: Diagnosis not present

## 2021-10-13 ENCOUNTER — Inpatient Hospital Stay (HOSPITAL_BASED_OUTPATIENT_CLINIC_OR_DEPARTMENT_OTHER): Payer: No Typology Code available for payment source | Admitting: Internal Medicine

## 2021-10-13 ENCOUNTER — Inpatient Hospital Stay: Payer: No Typology Code available for payment source | Attending: Internal Medicine

## 2021-10-13 ENCOUNTER — Inpatient Hospital Stay: Payer: No Typology Code available for payment source

## 2021-10-13 ENCOUNTER — Other Ambulatory Visit: Payer: Self-pay

## 2021-10-13 ENCOUNTER — Encounter: Payer: Self-pay | Admitting: Internal Medicine

## 2021-10-13 VITALS — BP 133/82 | HR 70 | Temp 96.9°F | Resp 16 | Wt 188.4 lb

## 2021-10-13 DIAGNOSIS — Z170 Estrogen receptor positive status [ER+]: Secondary | ICD-10-CM

## 2021-10-13 DIAGNOSIS — Z5112 Encounter for antineoplastic immunotherapy: Secondary | ICD-10-CM | POA: Diagnosis not present

## 2021-10-13 DIAGNOSIS — C50512 Malignant neoplasm of lower-outer quadrant of left female breast: Secondary | ICD-10-CM | POA: Insufficient documentation

## 2021-10-13 DIAGNOSIS — L819 Disorder of pigmentation, unspecified: Secondary | ICD-10-CM | POA: Insufficient documentation

## 2021-10-13 DIAGNOSIS — Z9221 Personal history of antineoplastic chemotherapy: Secondary | ICD-10-CM | POA: Diagnosis not present

## 2021-10-13 LAB — COMPREHENSIVE METABOLIC PANEL
ALT: 30 U/L (ref 0–44)
AST: 23 U/L (ref 15–41)
Albumin: 3.9 g/dL (ref 3.5–5.0)
Alkaline Phosphatase: 63 U/L (ref 38–126)
Anion gap: 9 (ref 5–15)
BUN: 23 mg/dL — ABNORMAL HIGH (ref 6–20)
CO2: 24 mmol/L (ref 22–32)
Calcium: 8.9 mg/dL (ref 8.9–10.3)
Chloride: 106 mmol/L (ref 98–111)
Creatinine, Ser: 1.01 mg/dL — ABNORMAL HIGH (ref 0.44–1.00)
GFR, Estimated: >60 mL/min (ref >60)
Glucose, Bld: 93 mg/dL (ref 70–99)
Potassium: 4 mmol/L (ref 3.5–5.1)
Sodium: 139 mmol/L (ref 135–145)
Total Bilirubin: 0.3 mg/dL (ref 0.3–1.2)
Total Protein: 6.7 g/dL (ref 6.5–8.1)

## 2021-10-13 LAB — CBC WITH DIFFERENTIAL/PLATELET
Abs Immature Granulocytes: 0.02 10*3/uL (ref 0.00–0.07)
Basophils Absolute: 0 10*3/uL (ref 0.0–0.1)
Basophils Relative: 0 %
Eosinophils Absolute: 0.2 10*3/uL (ref 0.0–0.5)
Eosinophils Relative: 3 %
HCT: 36.1 % (ref 36.0–46.0)
Hemoglobin: 11.7 g/dL — ABNORMAL LOW (ref 12.0–15.0)
Immature Granulocytes: 0 %
Lymphocytes Relative: 27 %
Lymphs Abs: 1.5 10*3/uL (ref 0.7–4.0)
MCH: 25.8 pg — ABNORMAL LOW (ref 26.0–34.0)
MCHC: 32.4 g/dL (ref 30.0–36.0)
MCV: 79.5 fL — ABNORMAL LOW (ref 80.0–100.0)
Monocytes Absolute: 0.5 10*3/uL (ref 0.1–1.0)
Monocytes Relative: 10 %
Neutro Abs: 3.2 10*3/uL (ref 1.7–7.7)
Neutrophils Relative %: 60 %
Platelets: 265 10*3/uL (ref 150–400)
RBC: 4.54 MIL/uL (ref 3.87–5.11)
RDW: 15.9 % — ABNORMAL HIGH (ref 11.5–15.5)
WBC: 5.4 10*3/uL (ref 4.0–10.5)
nRBC: 0 % (ref 0.0–0.2)

## 2021-10-13 MED ORDER — SODIUM CHLORIDE 0.9 % IV SOLN
Freq: Once | INTRAVENOUS | Status: AC
  Filled 2021-10-13: qty 250

## 2021-10-13 MED ORDER — ACETAMINOPHEN 325 MG PO TABS
650.0000 mg | ORAL_TABLET | Freq: Once | ORAL | Status: AC
  Administered 2021-10-13: 650 mg via ORAL
  Filled 2021-10-13: qty 2

## 2021-10-13 MED ORDER — TRASTUZUMAB-ANNS CHEMO 150 MG IV SOLR
6.0000 mg/kg | Freq: Once | INTRAVENOUS | Status: AC
  Administered 2021-10-13: 525 mg via INTRAVENOUS
  Filled 2021-10-13: qty 25

## 2021-10-13 MED ORDER — SODIUM CHLORIDE 0.9% FLUSH
10.0000 mL | Freq: Once | INTRAVENOUS | Status: AC
  Administered 2021-10-13: 10 mL via INTRAVENOUS
  Filled 2021-10-13: qty 10

## 2021-10-13 MED ORDER — HEPARIN SOD (PORK) LOCK FLUSH 100 UNIT/ML IV SOLN
500.0000 [IU] | Freq: Once | INTRAVENOUS | Status: AC
  Administered 2021-10-13: 500 [IU] via INTRAVENOUS
  Filled 2021-10-13: qty 5

## 2021-10-13 MED ORDER — TRASTUZUMAB-ANNS CHEMO 150 MG IV SOLR: 2.0000 mg/kg | Freq: Once | INTRAVENOUS | Status: DC

## 2021-10-13 NOTE — Assessment & Plan Note (Signed)
#  Left breast- CA- s/p mastectomy-  pT1c pN0 [Stage IA]Grade 3.  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  On adjuvant Herceptin. Started anastrozole [Oct 4996]; STABLE.   #Proceed with  Adjuvant/maintenace herceptin q3 W. Labs today reviewed;  acceptable for treatment today.  Will discontinue Benadryl.  AUG 2022- LVEF- 56.8 %.Comparable to LVEF equals 63.8% on 03/05/2021;  MUGA scan in mid dec 2022.   # Bil hands- dorsum hyperpigmentation continue urea cream 40%+ SA/improved-. STABLE.   # DISPOSITION: # herceptin today  # on dec 27th-]-MD; labs- cbc/cmp; Herceptin;MUGA scan-Dr.B

## 2021-10-13 NOTE — Progress Notes (Signed)
one [facility] NOTE  Patient Care Team: [name], MD as PCP - General (Internal Medicine) [name], RN as Registered Nurse  CHIEF COMPLAINTS/PURPOSE OF CONSULTATION: Breast cancer    Oncology History Overview Note  # April 2022-stage Ia mammary carcinoma ER/PR positive HER2 positive [TRIPLE positive]; negative margins s/p simple mastectomy with plan for immediate reconstruction [Dr.Rodenberg/[name]]; NO RT  # May 2nd, 2022- Taxol-Herceptin  # OCT 3rd, 2022- Anastrazole 1m/day [AUG 2022- BMD- WNL. ]  # MUGA scan- 64% [03/06/2021]-   # LMP- mid 2020; Uterine ablation- [id number]; # HTN; Asthma- well controlled [on allergy shots];.Marland Kitchen   Malignant neoplasm of lower-outer quadrant of left breast of female, estrogen receptor positive ([facility]  12/25/2020 Initial Diagnosis   Malignant neoplasm of lower-outer quadrant of left breast of [place], estrogen receptor positive ([facility]   02/26/2021 Cancer Staging   Staging form: Breast, AJCC 8th Edition - Pathologic: Stage IA (pT1c, pN0, cM0, G3, ER+, PR+, HER2+) - Signed by BCammie Sickle MD on 02/26/2021 Mitotic count score: Score 3 Histologic grading system: 3 grade system    03/10/2021 -  Chemotherapy   Patient is on Treatment Plan : BREAST Paclitaxel + Trastuzumab q7d / Trastuzumab q21d      Genetic Testing   Negative genetic testing. No pathogenic variants identified on the [facility] Multi-Cancer+RNA panel. 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 The report date is 04/29/2021.  The Multi-Cancer Panel + RNA offered by [facility] includes sequencing and/or deletion duplication testing of the following 84 genes: AIP, ALK, APC, ATM, AXIN2,BAP1,  BARD1, BLM, BMPR1A, BRCA1, BRCA2, BRIP1, CASR, CDC73, CDH1, CDK4, CDKN1B, CDKN1C, CDKN2A (p14ARF), CDKN2A (p16INK4a), CEBPA, CHEK2, CTNNA1, DICER1, DIS3L2, EGFR (c.2369C>T, p.Thr790Met variant only), EPCAM (Deletion/duplication testing only), FH, FLCN, GATA2, GPC3, GREM1  (Promoter region deletion/duplication testing only), HOXB13 (c.251G>A, p.Gly84Glu), HRAS, KIT, MAX, MEN1, MET, MITF (c.952G>A, p.Glu318Lys variant only), MLH1, MSH2, MSH3, MSH6, MUTYH, NBN, NF1, NF2, NTHL1, PALB2, PDGFRA, PHOX2B, PMS2, POLD1, POLE, POT1, PRKAR1A, PTCH1, PTEN, RAD50, RAD51C, RAD51D, RB1, RECQL4, RET, RUNX1, SDHAF2, SDHA (sequence changes only), SDHB, SDHC, SDHD, SMAD4, SMARCA4, SMARCB1, SMARCE1, STK11, SUFU, TERC, TERT, TMEM127, TP53, TSC1, TSC2, VHL, WRN and WT1.      HISTORY OF PRESENTING ILLNESS: Alone.  Ambulating independently. LLenetta [id number].  female newly diagnosed breast cancer stage I ER/PR positive HER2/neu positive on adjuvant Herceptin; anastrazole is here for follow-up.  No nausea no vomiting.  No fever no chills.  No shortness of breath or cough.  No swelling in the legs.  Review of Systems  Constitutional:  Negative for chills, diaphoresis, fever, malaise/fatigue and weight loss.  HENT:  Negative for nosebleeds and sore throat.   Eyes:  Negative for double vision.  Respiratory:  Negative for cough, hemoptysis, sputum production, shortness of breath and wheezing.   Cardiovascular:  Negative for chest pain, palpitations, orthopnea and leg swelling.  Gastrointestinal:  Negative for abdominal pain, blood in stool, constipation, diarrhea, heartburn, melena, nausea and vomiting.  Genitourinary:  Negative for dysuria, frequency and urgency.  Musculoskeletal:  Negative for back pain and joint pain.  Skin: Negative.  Negative for itching and rash.  Neurological:  Negative for dizziness, tingling, focal weakness, weakness and headaches.  Endo/Heme/Allergies:  Does not bruise/bleed easily.  Psychiatric/Behavioral:  Negative for depression. The patient is not nervous/anxious and does not have insomnia.     MEDICAL HISTORY:  Past Medical History:  Diagnosis Date   Asthma    well controlled   Breast cancer (  [place])    Family history of adverse reaction to  anesthesia    sister-hard time waking up   Family history of breast cancer    Family history of prostate cancer    Family history of uterine cancer    GERD (gastroesophageal reflux disease)    Hypertension     SURGICAL HISTORY: Past Surgical History:  Procedure Laterality Date   ABLATION     BREAST BIOPSY Left 2011   Benign per pt   BREAST BIOPSY Left 12/12/2020   3:30 3 cmfn, Q marker, path pending   BREAST BIOPSY Left 12/12/2020   3:30 1 cmfn, Vision marker, path pending   BREAST RECONSTRUCTION WITH PLACEMENT OF TISSUE EXPANDER AND FLEX HD (ACELLULAR HYDRATED DERMIS) Left 02/10/2021   Procedure: IMMEDIATE LEFT BREAST RECONSTRUCTION WITH PLACEMENT OF TISSUE EXPANDER AND FLEX HD (ACELLULAR HYDRATED DERMIS);  Surgeon: Wallace Going, DO;  Location: [facility];  Service: Plastics;  Laterality: Left;   COLONOSCOPY  01/15/2020   PORTACATH PLACEMENT Right 02/10/2021   Procedure: INSERTION PORT-A-CATH;  Surgeon: Ronny Bacon, MD;  Location: [facility];  Service: General;  Laterality: Right;   REMOVAL OF TISSUE EXPANDER AND PLACEMENT OF IMPLANT Left 07/09/2021   Procedure: REMOVAL OF TISSUE EXPANDER AND PLACEMENT OF IMPLANT LEFT BREAST;  Surgeon: Wallace Going, DO;  Location: [facility];  Service: Plastics;  Laterality: Left;   SIMPLE MASTECTOMY WITH AXILLARY SENTINEL NODE BIOPSY Left 02/10/2021   Procedure: SIMPLE MASTECTOMY WITH AXILLARY SENTINEL NODE BIOPSY;  Surgeon: Ronny Bacon, MD;  Location: [facility];  Service: General;  Laterality: Left;    SOCIAL HISTORY: Social History   Socioeconomic History   Marital status: Married    Spouse name: Not on file   Number of children: Not on file   Years of education: Not on file   Highest education level: Not on file  Occupational History   Not on file  Tobacco Use   Smoking status: Never   Smokeless tobacco: Never  Vaping Use   Vaping Use: Never used  Substance and Sexual Activity   Alcohol use: Not  Currently   Drug use: Never   Sexual activity: Not on file  Other Topics Concern   Not on file  Social History Narrative   Lives in [place] with husband; kids- college. Works for [employer]- working from home. No smoking or alcohol.    Social Determinants of Health   Financial Resource Strain: Not on file  Food Insecurity: Not on file  Transportation Needs: Not on file  Physical Activity: Not on file  Stress: Not on file  Social Connections: Not on file  Intimate Partner Violence: Not on file    FAMILY HISTORY: Family History  Problem Relation Age of Onset   Hypertension Mother    Diabetes Mother    Hypertension Father    Diabetes Father    Cancer Father        prostate cancer-70s   Cancer Maternal Grandmother        uterine cancer   Breast cancer Sister        in in 10s.     ALLERGIES:  is allergic to shrimp extract allergy skin test and other.  MEDICATIONS:  Current Outpatient Medications  Medication Sig Dispense Refill   albuterol (VENTOLIN HFA) 108 (90 Base) MCG/ACT inhaler Inhale 1 puff into the lungs every 6 (six) hours as needed for shortness of breath.     amLODipine (NORVASC) 10 MG tablet Take 10 mg by  mouth every morning.     anastrozole (ARIMIDEX) 1 MG tablet Take 1 tablet (1 mg total) by mouth daily. 30 tablet 3   budesonide (PULMICORT) 0.5 MG/2ML nebulizer solution Take 2 mLs by nebulization daily.     cetirizine (ZYRTEC) 10 MG tablet Take 10 mg by mouth daily as needed for allergies.     Cholecalciferol 1.25 MG (50000 UT) capsule Take 50,000 Units by mouth once a week.     EPINEPHrine 0.3 mg/0.3 mL IJ SOAJ injection Inject 0.3 mg into the muscle as needed for anaphylaxis.     fluticasone (FLONASE) 50 MCG/ACT nasal spray Place 1 spray into both nostrils daily as needed for allergies.     fluticasone furoate-vilanterol (BREO ELLIPTA) 100-25 MCG/INH AEPB Inhale 1 puff into the lungs as needed for shortness of breath.     GNP BUDESONIDE  NASAL SPRAY NA Place 1 spray into the nose daily.     lidocaine-prilocaine (EMLA) cream Apply 30 -45 mins prior to port access. 50 g 0   meloxicam (MOBIC) 15 MG tablet Take 15 mg by mouth daily.     Multiple Vitamins-Minerals (CENTRUM ADULTS) TABS Take 1 tablet by mouth daily.     NON FORMULARY Takes weekly allergy shots     pantoprazole (PROTONIX) 40 MG tablet Take 40 mg by mouth every morning.     rosuvastatin (CRESTOR) 40 MG tablet Take 40 mg by mouth daily.     ondansetron (ZOFRAN) 4 MG tablet Take 1 tablet (4 mg total) by mouth every 8 (eight) hours as needed for nausea or vomiting. 20 tablet 0   prochlorperazine (COMPAZINE) 10 MG tablet Take 1 tablet (10 mg total) by mouth every 6 (six) hours as needed for nausea or vomiting. 40 tablet 1   No current facility-administered medications for this visit.      Marland Kitchen  PHYSICAL EXAMINATION: ECOG PERFORMANCE STATUS: 0 - Asymptomatic  Vitals:   10/13/21 1012  BP: 133/82  Pulse: 70  Resp: 16  Temp: (!) 96.9 F (36.1 C)   Filed Weights   10/13/21 1012  Weight: 188 lb 6.4 oz (85.5 kg)    Physical Exam HENT:     Head: Normocephalic and atraumatic.     Mouth/Throat:     Pharynx: No oropharyngeal exudate.  Eyes:     Pupils: Pupils are equal, round, and reactive to light.  Cardiovascular:     Rate and Rhythm: Normal rate and regular rhythm.  Pulmonary:     Effort: Pulmonary effort is normal. No respiratory distress.     Breath sounds: Normal breath sounds. No wheezing.  Abdominal:     General: Bowel sounds are normal. There is no distension.     Palpations: Abdomen is soft. There is no mass.     Tenderness: There is no abdominal tenderness. There is no guarding or rebound.  Musculoskeletal:        General: No tenderness. Normal range of motion.     Cervical back: Normal range of motion and neck supple.  Skin:    General: Skin is warm.  Neurological:     Mental Status: She is alert and oriented to person, place, and time.   Psychiatric:        Mood and Affect: Affect normal.     LABORATORY DATA:  I have reviewed the data as listed Lab Results  Component Value Date   WBC 5.4 10/13/2021   HGB 11.7 (L) 10/13/2021   HCT 36.1 10/13/2021   MCV 79.5 (L)  10/13/2021   PLT 265 10/13/2021   Recent Labs    09/01/21 0853 09/22/21 0827 10/13/21 0952  NA 136 138 139  K 3.7 4.1 4.0  CL 107 106 106  CO2 '26 27 24  ' GLUCOSE 123* 105* 93  BUN 26* 20 23*  CREATININE 0.89 1.01* 1.01*  CALCIUM 8.5* 9.0 8.9  GFRNONAA >60 >60 >60  PROT 6.8 7.0 6.7  ALBUMIN 4.0 4.0 3.9  AST '21 23 23  ' ALT 30 33 30  ALKPHOS 65 73 63  BILITOT 0.5 0.4 0.3    RADIOGRAPHIC STUDIES: I have personally reviewed the radiological images as listed and agreed with the findings in the report. No results found.  ASSESSMENT & PLAN:   Malignant neoplasm of lower-outer quadrant of left breast of female, estrogen receptor positive ([place]) # Left breast- CA- s/p mastectomy-  pT1c pN0 [Stage IA]Grade 3.  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  On adjuvant Herceptin. Started anastrozole [Oct 1102]; STABLE.   #Proceed with  Adjuvant/maintenace herceptin q3 W. Labs today reviewed;  acceptable for treatment today.  Will discontinue Benadryl.  AUG 2022- LVEF- 56.8 %.Comparable to LVEF equals 63.8% on 03/05/2021;  MUGA scan in mid dec 2022.   # Bil hands- dorsum hyperpigmentation continue urea cream 40%+ SA/improved-. STABLE.   # DISPOSITION: # herceptin today  # on dec 27th-]-MD; labs- cbc/cmp; Herceptin;MUGA scan-Dr.B  All questions were answered. The patient/family knows to call the clinic with any problems, questions or concerns.    Cammie Sickle, MD 10/13/2021 2:16 PM

## 2021-10-13 NOTE — Patient Instructions (Signed)
[facility] AT [place]  Discharge Instructions: Thank you for choosing [facility] to provide your oncology and hematology care.  If you have a lab appointment with the [facility], please go directly to the [facility] and check in at the registration area.  Wear comfortable clothing and clothing appropriate for easy access to any Portacath or PICC line.   We strive to give you quality time with your provider. You may need to reschedule your appointment if you arrive late (15 or more minutes).  Arriving late affects you and other patients whose appointments are after yours.  Also, if you miss three or more appointments without notifying the office, you may be dismissed from the clinic at the provider's discretion.      For prescription refill requests, have your pharmacy contact our office and allow 72 hours for refills to be completed.    Today you received the following chemotherapy and/or immunotherapy agents: Herceptin      To help prevent nausea and vomiting after your treatment, we encourage you to take your nausea medication as directed.  BELOW ARE SYMPTOMS THAT SHOULD BE REPORTED IMMEDIATELY: *FEVER GREATER THAN 100.4 F (38 C) OR HIGHER *CHILLS OR SWEATING *NAUSEA AND VOMITING THAT IS NOT CONTROLLED WITH YOUR NAUSEA MEDICATION *UNUSUAL SHORTNESS OF BREATH *UNUSUAL BRUISING OR BLEEDING *URINARY PROBLEMS (pain or burning when urinating, or frequent urination) *BOWEL PROBLEMS (unusual diarrhea, constipation, pain near the anus) TENDERNESS IN MOUTH AND THROAT WITH OR WITHOUT PRESENCE OF ULCERS (sore throat, sores in mouth, or a toothache) UNUSUAL RASH, SWELLING OR PAIN  UNUSUAL VAGINAL DISCHARGE OR ITCHING   Items with * indicate a potential emergency and should be followed up as soon as possible or go to the Emergency Department if any problems should occur.  Please show the CHEMOTHERAPY ALERT CARD or IMMUNOTHERAPY ALERT CARD at check-in to  the Emergency Department and triage nurse.  Should you have questions after your visit or need to cancel or reschedule your appointment, please contact [facility] [place] AT [place]  [phone] and follow the prompts.  Office hours are 8:00 a.m. to 4:30 p.m. Monday - Friday. Please note that voicemails left after 4:00 p.m. may not be returned until the following business day.  We are closed weekends and major holidays. You have access to a nurse at all times for urgent questions. Please call the main number to the clinic [phone] and follow the prompts.  For any non-urgent questions, you may also contact your provider using MyChart. We now offer e-Visits for anyone 57 and older to request care online for non-urgent symptoms. For details visit [email or web address].   Also download the MyChart app! Go to the app store, search "MyChart", open the app, select [facility], and log in with your MyChart username and password.  Due to Covid, a mask is required upon entering the hospital/clinic. If you do not have a mask, one will be given to you upon arrival. For doctor visits, patients may have 1 support person aged 55 or older with them. For treatment visits, patients cannot have anyone with them due to current Covid guidelines and our immunocompromised population. Trastuzumab injection for infusion What is this medication? TRASTUZUMAB (tras TOO zoo mab) is a monoclonal antibody. It is used to treat breast cancer and stomach cancer. This medicine may be used for other purposes; ask your health care provider or pharmacist if you have questions. COMMON BRAND NAME(S): Herceptin, Janae Bridgeman, Chalmers Guest What should  I tell my care team before I take this medication? They need to know if you have any of these conditions: heart disease heart failure lung or breathing disease, like asthma an unusual or allergic reaction to trastuzumab, benzyl alcohol, or other  medications, foods, dyes, or preservatives pregnant or trying to get pregnant breast-feeding How should I use this medication? This drug is given as an infusion into a vein. It is administered in a hospital or clinic by a specially trained health care professional. Talk to your pediatrician regarding the use of this medicine in children. This medicine is not approved for use in children. Overdosage: If you think you have taken too much of this medicine contact a poison control center or emergency room at once. NOTE: This medicine is only for you. Do not share this medicine with others. What if I miss a dose? It is important not to miss a dose. Call your doctor or health care professional if you are unable to keep an appointment. What may interact with this medication? This medicine may interact with the following medications: certain types of chemotherapy, such as daunorubicin, doxorubicin, epirubicin, and idarubicin This list may not describe all possible interactions. Give your health care provider a list of all the medicines, herbs, non-prescription drugs, or dietary supplements you use. Also tell them if you smoke, drink alcohol, or use illegal drugs. Some items may interact with your medicine. What should I watch for while using this medication? Visit your doctor for checks on your progress. Report any side effects. Continue your course of treatment even though you feel ill unless your doctor tells you to stop. Call your doctor or health care professional for advice if you get a fever, chills or sore throat, or other symptoms of a cold or flu. Do not treat yourself. Try to avoid being around people who are sick. You may experience fever, chills and shaking during your first infusion. These effects are usually mild and can be treated with other medicines. Report any side effects during the infusion to your health care professional. Fever and chills usually do not happen with later infusions. Do  not become pregnant while taking this medicine or for 7 months after stopping it. Women should inform their doctor if they wish to become pregnant or think they might be pregnant. Women of child-bearing potential will need to have a negative pregnancy test before starting this medicine. There is a potential for serious side effects to an unborn child. Talk to your health care professional or pharmacist for more information. Do not breast-feed an infant while taking this medicine or for 7 months after stopping it. Women must use effective birth control with this medicine. What side effects may I notice from receiving this medication? Side effects that you should report to your doctor or health care professional as soon as possible: allergic reactions like skin rash, itching or hives, swelling of the face, lips, or tongue chest pain or palpitations cough dizziness feeling faint or lightheaded, falls fever general ill feeling or flu-like symptoms signs of worsening heart failure like breathing problems; swelling in your legs and feet unusually weak or tired Side effects that usually do not require medical attention (report to your doctor or health care professional if they continue or are bothersome): bone pain changes in taste diarrhea joint pain nausea/vomiting weight loss This list may not describe all possible side effects. Call your doctor for medical advice about side effects. You may report side effects to FDA at  1-800-FDA-1088. Where should I keep my medication? This drug is given in a hospital or clinic and will not be stored at home. NOTE: This sheet is a summary. It may not cover all possible information. If you have questions about this medicine, talk to your doctor, pharmacist, or health care provider.  2022 Elsevier/Gold Standard (2016-11-10 00:00:00)

## 2021-10-16 DIAGNOSIS — J301 Allergic rhinitis due to pollen: Secondary | ICD-10-CM | POA: Diagnosis not present

## 2021-10-19 IMAGING — NM NM CARDIA MUGA REST
6 series · 38 of 38 positions shown · non-contrast
Comparison: MUGA scan 03/05/2021

CLINICAL DATA: Breast cancer. Evaluate cardiac function in relation
to chemotherapy.

EXAM:
NUCLEAR MEDICINE CARDIAC BLOOD POOL IMAGING (MUGA)
TECHNIQUE: Cardiac multi-gated acquisition was performed at rest following
intravenous injection of 3c-CCm labeled red blood cells.
RADIOPHARMACEUTICALS:  20.7 mCi 3c-CCm pertechnetate in-vitro
labeled red blood cells IV

[Series 1000: 70 degree-gated · 3.30mm/px · 6 of 24 frames shown]
[frame 3/24]
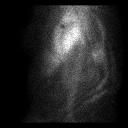
[frame 7/24]
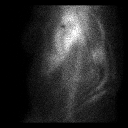
[frame 11/24]
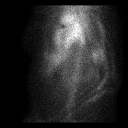
[frame 15/24]
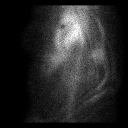
[frame 19/24]
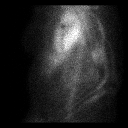
[frame 23/24]
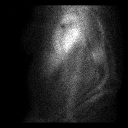

[Series 1000: 45 lao-gated (original with roi) · 3.30mm/px · 6 of 24 frames shown]
[frame 3/24]
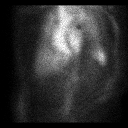
[frame 7/24]
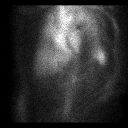
[frame 11/24]
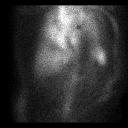
[frame 15/24]
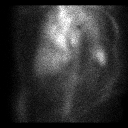
[frame 19/24]
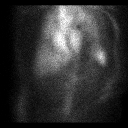
[frame 23/24]
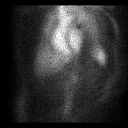

[Series 1000: 45 lao-gated · 3.30mm/px · 6 of 24 frames shown]
[frame 3/24]
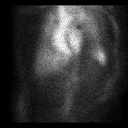
[frame 7/24]
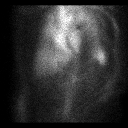
[frame 11/24]
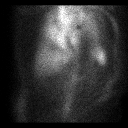
[frame 15/24]
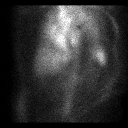
[frame 19/24]
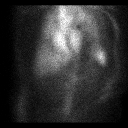
[frame 23/24]
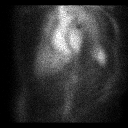

[Series 1000: 45 lao-gated (functional) · 3.30mm/px · 8 of 8 slices shown]
[im 1/8  full-range]
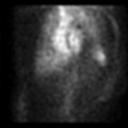
[im 2/8]
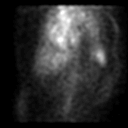
[im 3/8]
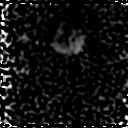
[im 4/8  full-range]
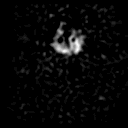
[im 5/8  full-range]
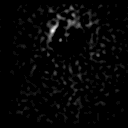
[im 6/8  full-range]
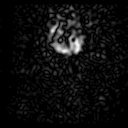
[im 7/8]
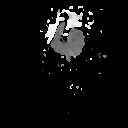
[im 8/8]
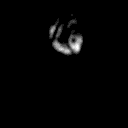

[Series 1000: 45 lao-gated (results) · 3.30mm/px · 6 of 24 frames shown]
[frame 3/24]
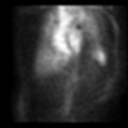
[frame 7/24]
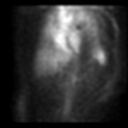
[frame 11/24]
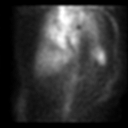
[frame 15/24]
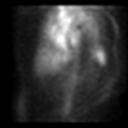
[frame 19/24]
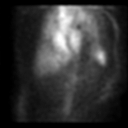
[frame 23/24]
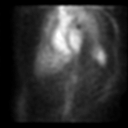

[Series 1000: anterior-gated · 3.30mm/px · 6 of 24 frames shown]
[frame 3/24]
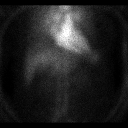
[frame 7/24]
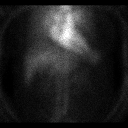
[frame 11/24]
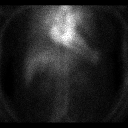
[frame 15/24]
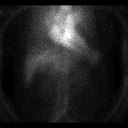
[frame 19/24]
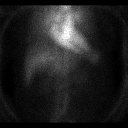
[frame 23/24]
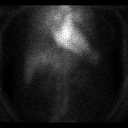

[38 of 38 positions shown; findings below may reference images not displayed]

FINDINGS: No  focal wall motion abnormality of the left ventricle.

Calculated left ventricular ejection fraction equals 56.8 %.

Comparable to LVEF equals 63.8% on 03/05/2021.
IMPRESSION: Left ventricular ejection fraction equals 56.8 %.

## 2021-10-21 ENCOUNTER — Other Ambulatory Visit: Payer: Self-pay | Admitting: Oncology

## 2021-10-21 DIAGNOSIS — Z170 Estrogen receptor positive status [ER+]: Secondary | ICD-10-CM

## 2021-10-21 DIAGNOSIS — C50512 Malignant neoplasm of lower-outer quadrant of left female breast: Secondary | ICD-10-CM

## 2021-10-23 DIAGNOSIS — J301 Allergic rhinitis due to pollen: Secondary | ICD-10-CM | POA: Diagnosis not present

## 2021-10-27 ENCOUNTER — Ambulatory Visit: Payer: No Typology Code available for payment source

## 2021-10-28 ENCOUNTER — Other Ambulatory Visit: Payer: Self-pay

## 2021-10-28 ENCOUNTER — Ambulatory Visit
Admission: RE | Admit: 2021-10-28 | Discharge: 2021-10-28 | Disposition: A | Discharge status: Home / Self Care | Payer: No Typology Code available for payment source | Source: Ambulatory Visit | Attending: Oncology | Admitting: Oncology

## 2021-10-28 DIAGNOSIS — C50919 Malignant neoplasm of unspecified site of unspecified female breast: Secondary | ICD-10-CM | POA: Diagnosis not present

## 2021-10-28 DIAGNOSIS — Z170 Estrogen receptor positive status [ER+]: Secondary | ICD-10-CM

## 2021-10-28 DIAGNOSIS — C50512 Malignant neoplasm of lower-outer quadrant of left female breast: Secondary | ICD-10-CM | POA: Diagnosis not present

## 2021-10-28 DIAGNOSIS — I10 Essential (primary) hypertension: Secondary | ICD-10-CM | POA: Insufficient documentation

## 2021-10-28 DIAGNOSIS — Z01818 Encounter for other preprocedural examination: Secondary | ICD-10-CM | POA: Diagnosis not present

## 2021-10-28 DIAGNOSIS — Z0189 Encounter for other specified special examinations: Secondary | ICD-10-CM | POA: Diagnosis not present

## 2021-10-28 LAB — ECHOCARDIOGRAM COMPLETE
AR max vel: 1.5 cm2
AV Area VTI: 1.55 cm2
AV Area mean vel: 1.59 cm2
AV Mean grad: 4 mmHg
AV Peak grad: 6.7 mmHg
Ao pk vel: 1.29 m/s
Area-P 1/2: 4.12 cm2
MV VTI: 2.67 cm2
S' Lateral: 3.3 cm

## 2021-10-28 NOTE — Progress Notes (Signed)
*  PRELIMINARY RESULTS* Echocardiogram 2D Echocardiogram has been performed.  Sherrie Sport 10/28/2021, 9:47 AM

## 2021-10-30 DIAGNOSIS — J301 Allergic rhinitis due to pollen: Secondary | ICD-10-CM | POA: Diagnosis not present

## 2021-10-31 NOTE — Progress Notes (Signed)
Echo looks good.   Faythe Casa, NP 10/31/2021 10:19 AM

## 2021-11-04 ENCOUNTER — Inpatient Hospital Stay: Payer: No Typology Code available for payment source

## 2021-11-04 ENCOUNTER — Other Ambulatory Visit: Payer: Self-pay

## 2021-11-04 ENCOUNTER — Inpatient Hospital Stay (HOSPITAL_BASED_OUTPATIENT_CLINIC_OR_DEPARTMENT_OTHER): Payer: No Typology Code available for payment source | Admitting: Internal Medicine

## 2021-11-04 ENCOUNTER — Encounter: Payer: Self-pay | Admitting: Internal Medicine

## 2021-11-04 DIAGNOSIS — C50512 Malignant neoplasm of lower-outer quadrant of left female breast: Secondary | ICD-10-CM

## 2021-11-04 DIAGNOSIS — Z170 Estrogen receptor positive status [ER+]: Secondary | ICD-10-CM

## 2021-11-04 DIAGNOSIS — L819 Disorder of pigmentation, unspecified: Secondary | ICD-10-CM | POA: Diagnosis not present

## 2021-11-04 DIAGNOSIS — Z5112 Encounter for antineoplastic immunotherapy: Secondary | ICD-10-CM | POA: Diagnosis not present

## 2021-11-04 LAB — CBC WITH DIFFERENTIAL/PLATELET
Abs Immature Granulocytes: 0.02 10*3/uL (ref 0.00–0.07)
Basophils Absolute: 0 10*3/uL (ref 0.0–0.1)
Basophils Relative: 0 %
Eosinophils Absolute: 0.4 10*3/uL (ref 0.0–0.5)
Eosinophils Relative: 8 %
HCT: 38.3 % (ref 36.0–46.0)
Hemoglobin: 12.2 g/dL (ref 12.0–15.0)
Immature Granulocytes: 0 %
Lymphocytes Relative: 30 %
Lymphs Abs: 1.3 10*3/uL (ref 0.7–4.0)
MCH: 25.2 pg — ABNORMAL LOW (ref 26.0–34.0)
MCHC: 31.9 g/dL (ref 30.0–36.0)
MCV: 79.1 fL — ABNORMAL LOW (ref 80.0–100.0)
Monocytes Absolute: 0.4 10*3/uL (ref 0.1–1.0)
Monocytes Relative: 9 %
Neutro Abs: 2.3 10*3/uL (ref 1.7–7.7)
Neutrophils Relative %: 53 %
Platelets: 256 10*3/uL (ref 150–400)
RBC: 4.84 MIL/uL (ref 3.87–5.11)
RDW: 16.1 % — ABNORMAL HIGH (ref 11.5–15.5)
WBC: 4.5 10*3/uL (ref 4.0–10.5)
nRBC: 0 % (ref 0.0–0.2)

## 2021-11-04 LAB — COMPREHENSIVE METABOLIC PANEL
ALT: 35 U/L (ref 0–44)
AST: 25 U/L (ref 15–41)
Albumin: 4 g/dL (ref 3.5–5.0)
Alkaline Phosphatase: 72 U/L (ref 38–126)
Anion gap: 8 (ref 5–15)
BUN: 17 mg/dL (ref 6–20)
CO2: 25 mmol/L (ref 22–32)
Calcium: 9 mg/dL (ref 8.9–10.3)
Chloride: 103 mmol/L (ref 98–111)
Creatinine, Ser: 0.87 mg/dL (ref 0.44–1.00)
GFR, Estimated: >60 mL/min (ref >60)
Glucose, Bld: 106 mg/dL — ABNORMAL HIGH (ref 70–99)
Potassium: 4 mmol/L (ref 3.5–5.1)
Sodium: 136 mmol/L (ref 135–145)
Total Bilirubin: 0.5 mg/dL (ref 0.3–1.2)
Total Protein: 6.8 g/dL (ref 6.5–8.1)

## 2021-11-04 MED ORDER — HEPARIN SOD (PORK) LOCK FLUSH 100 UNIT/ML IV SOLN
500.0000 [IU] | Freq: Once | INTRAVENOUS | Status: AC | PRN
  Administered 2021-11-04: 12:00:00 500 [IU]
  Filled 2021-11-04: qty 5

## 2021-11-04 MED ORDER — ACETAMINOPHEN 325 MG PO TABS
650.0000 mg | ORAL_TABLET | Freq: Once | ORAL | Status: AC
  Administered 2021-11-04: 11:00:00 650 mg via ORAL
  Filled 2021-11-04: qty 2

## 2021-11-04 MED ORDER — SODIUM CHLORIDE 0.9 % IV SOLN
Freq: Once | INTRAVENOUS | Status: AC
  Filled 2021-11-04: qty 250

## 2021-11-04 MED ORDER — TRASTUZUMAB-ANNS CHEMO 150 MG IV SOLR
6.0000 mg/kg | Freq: Once | INTRAVENOUS | Status: AC
  Administered 2021-11-04: 12:00:00 525 mg via INTRAVENOUS
  Filled 2021-11-04: qty 25

## 2021-11-04 NOTE — Assessment & Plan Note (Addendum)
#  Left breast- CA- s/p mastectomy-  pT1c pN0 [Stage IA]Grade 3.  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  On adjuvant Herceptin. Started anastrozole [Oct 9340]; STABLE.   #Proceed with  Adjuvant/maintenace herceptin q3 W. Labs today reviewed;  acceptable for treatment today.  Will discontinue Benadryl.  AUG 2022- LVEF- 56.8 %.Comparable to LVEF equals 63.8% on 03/05/2021;DEC 2022- Left ventricular ejection fraction, by estimation, is 55 to 60%. The  left ventricle has normal function.STABLE.   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  # DISPOSITION: # herceptin today  # in 3 weeks- MD; labs- cbc/cmp; Herceptin;-Dr.B

## 2021-11-04 NOTE — Progress Notes (Signed)
[facility] [place] NOTE  Patient Care Team: [name], MD as PCP - General (Internal Medicine) [name], RN as Registered Nurse  CHIEF COMPLAINTS/PURPOSE OF CONSULTATION: Breast cancer    Oncology History Overview Note  # April 2022-stage Ia mammary carcinoma ER/PR positive HER2 positive [TRIPLE positive]; negative margins s/p simple mastectomy with plan for immediate reconstruction [Dr.Rodenberg/[name]]; NO RT  # May 2nd, 2022- Taxol-Herceptin  # OCT 3rd, 2022- Anastrazole 62m/day [AUG 2022- BMD- WNL. ]  # MUGA scan- 64% [03/06/2021]-   # LMP- mid 2020; Uterine ablation- [id number]; # HTN; Asthma- well controlled [on allergy shots];.Marland Kitchen   Malignant neoplasm of lower-outer quadrant of left breast of female, estrogen receptor positive ([facility]  12/25/2020 Initial Diagnosis   Malignant neoplasm of lower-outer quadrant of left breast of female, estrogen receptor positive ([facility]   02/26/2021 Cancer Staging   Staging form: Breast, AJCC 8th Edition - Pathologic: Stage IA (pT1c, pN0, cM0, G3, ER+, PR+, HER2+) - Signed by BCammie Sickle MD on 02/26/2021 Mitotic count score: Score 3 Histologic grading system: 3 grade system    03/10/2021 -  Chemotherapy   Patient is on Treatment Plan : BREAST Paclitaxel + Trastuzumab q7d / Trastuzumab q21d      Genetic Testing   Negative genetic testing. No pathogenic variants identified on the [facility] Multi-Cancer+RNA panel. 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 The report date is 04/29/2021.  The Multi-Cancer Panel + RNA offered by [facility] includes sequencing and/or deletion duplication testing of the following 84 genes: AIP, ALK, APC, ATM, AXIN2,BAP1,  BARD1, BLM, BMPR1A, BRCA1, BRCA2, BRIP1, CASR, CDC73, CDH1, CDK4, CDKN1B, CDKN1C, CDKN2A (p14ARF), CDKN2A (p16INK4a), CEBPA, CHEK2, CTNNA1, DICER1, DIS3L2, EGFR (c.2369C>T, p.Thr790Met variant only), EPCAM (Deletion/duplication testing only), FH, FLCN, GATA2, GPC3, GREM1  (Promoter region deletion/duplication testing only), HOXB13 (c.251G>A, p.Gly84Glu), HRAS, KIT, MAX, MEN1, MET, MITF (c.952G>A, p.Glu318Lys variant only), MLH1, MSH2, MSH3, MSH6, MUTYH, NBN, NF1, NF2, NTHL1, PALB2, PDGFRA, PHOX2B, PMS2, POLD1, POLE, POT1, PRKAR1A, PTCH1, PTEN, RAD50, RAD51C, RAD51D, RB1, RECQL4, RET, RUNX1, SDHAF2, SDHA (sequence changes only), SDHB, SDHC, SDHD, SMAD4, SMARCA4, SMARCB1, SMARCE1, STK11, SUFU, TERC, TERT, TMEM127, TP53, TSC1, TSC2, VHL, WRN and WT1.      HISTORY OF PRESENTING ILLNESS: Alone.  Ambulating independently. LLenetta [id number].[id number].  female newly diagnosed breast cancer stage I ER/PR positive HER2/neu positive on adjuvant Herceptin; anastrazole is here for follow-up/review results of the 2D echo.  No nausea no vomiting.  No fever no chills.  No shortness of breath or cough.    Complains of peeling of the toenails-replace by new nails. No swelling in the legs.  Review of Systems  Constitutional:  Negative for chills, diaphoresis, fever, malaise/fatigue and weight loss.  HENT:  Negative for nosebleeds and sore throat.   Eyes:  Negative for double vision.  Respiratory:  Negative for cough, hemoptysis, sputum production, shortness of breath and wheezing.   Cardiovascular:  Negative for chest pain, palpitations, orthopnea and leg swelling.  Gastrointestinal:  Negative for abdominal pain, blood in stool, constipation, diarrhea, heartburn, melena, nausea and vomiting.  Genitourinary:  Negative for dysuria, frequency and urgency.  Musculoskeletal:  Negative for back pain and joint pain.  Skin: Negative.  Negative for itching and rash.  Neurological:  Negative for dizziness, tingling, focal weakness, weakness and headaches.  Endo/Heme/Allergies:  Does not bruise/bleed easily.  Psychiatric/Behavioral:  Negative for depression. The patient is not nervous/anxious and does not have insomnia.     MEDICAL HISTORY:  Past Medical  History:  Diagnosis Date   Asthma     well controlled   Breast cancer ([place])    Family history of adverse reaction to anesthesia    sister-hard time waking up   Family history of breast cancer    Family history of prostate cancer    Family history of uterine cancer    GERD (gastroesophageal reflux disease)    Hypertension     SURGICAL HISTORY: Past Surgical History:  Procedure Laterality Date   ABLATION     BREAST BIOPSY Left 2011   Benign per pt   BREAST BIOPSY Left 12/12/2020   3:30 3 cmfn, Q marker, path pending   BREAST BIOPSY Left 12/12/2020   3:30 1 cmfn, Vision marker, path pending   BREAST RECONSTRUCTION WITH PLACEMENT OF TISSUE EXPANDER AND FLEX HD (ACELLULAR HYDRATED DERMIS) Left 02/10/2021   Procedure: IMMEDIATE LEFT BREAST RECONSTRUCTION WITH PLACEMENT OF TISSUE EXPANDER AND FLEX HD (ACELLULAR HYDRATED DERMIS);  Surgeon: Wallace Going, DO;  Location: [facility];  Service: Plastics;  Laterality: Left;   COLONOSCOPY  01/15/2020   PORTACATH PLACEMENT Right 02/10/2021   Procedure: INSERTION PORT-A-CATH;  Surgeon: Ronny Bacon, MD;  Location: [facility];  Service: General;  Laterality: Right;   REMOVAL OF TISSUE EXPANDER AND PLACEMENT OF IMPLANT Left 07/09/2021   Procedure: REMOVAL OF TISSUE EXPANDER AND PLACEMENT OF IMPLANT LEFT BREAST;  Surgeon: Wallace Going, DO;  Location: [place];  Service: Plastics;  Laterality: Left;   SIMPLE MASTECTOMY WITH AXILLARY SENTINEL NODE BIOPSY Left 02/10/2021   Procedure: SIMPLE MASTECTOMY WITH AXILLARY SENTINEL NODE BIOPSY;  Surgeon: Ronny Bacon, MD;  Location: [facility];  Service: General;  Laterality: Left;    SOCIAL HISTORY: Social History   Socioeconomic History   Marital status: Married    Spouse name: Not on file   Number of children: Not on file   Years of education: Not on file   Highest education level: Not on file  Occupational History   Not on file  Tobacco Use   Smoking status: Never   Smokeless tobacco: Never  Vaping Use    Vaping Use: Never used  Substance and Sexual Activity   Alcohol use: Not Currently   Drug use: Never   Sexual activity: Not on file  Other Topics Concern   Not on file  Social History Narrative   Lives in [place] with husband; kids- college. Works for [employer]- working from home. No smoking or alcohol.    Social Determinants of Health   Financial Resource Strain: Not on file  Food Insecurity: Not on file  Transportation Needs: Not on file  Physical Activity: Not on file  Stress: Not on file  Social Connections: Not on file  Intimate Partner Violence: Not on file    FAMILY HISTORY: Family History  Problem Relation Age of Onset   Hypertension Mother    Diabetes Mother    Hypertension Father    Diabetes Father    Cancer Father        prostate cancer-70s   Cancer Maternal Grandmother        uterine cancer   Breast cancer Sister        in in 20s.     ALLERGIES:  is allergic to shrimp extract allergy skin test and other.  MEDICATIONS:  Current Outpatient Medications  Medication Sig Dispense Refill   albuterol (VENTOLIN HFA) 108 (90 Base) MCG/ACT inhaler Inhale 1 puff into the lungs every 6 (six) hours as needed for  shortness of breath.     amLODipine (NORVASC) 10 MG tablet Take 10 mg by mouth every morning.     anastrozole (ARIMIDEX) 1 MG tablet Take 1 tablet (1 mg total) by mouth daily. 30 tablet 3   budesonide (PULMICORT) 0.5 MG/2ML nebulizer solution Take 2 mLs by nebulization daily.     cetirizine (ZYRTEC) 10 MG tablet Take 10 mg by mouth daily as needed for allergies.     Cholecalciferol 1.25 MG (50000 UT) capsule Take 50,000 Units by mouth once a week.     EPINEPHrine 0.3 mg/0.3 mL IJ SOAJ injection Inject 0.3 mg into the muscle as needed for anaphylaxis.     fluticasone (FLONASE) 50 MCG/ACT nasal spray Place 1 spray into both nostrils daily as needed for allergies.     fluticasone furoate-vilanterol (BREO ELLIPTA) 100-25 MCG/INH AEPB Inhale 1  puff into the lungs as needed for shortness of breath.     GNP BUDESONIDE NASAL SPRAY NA Place 1 spray into the nose daily.     lidocaine-prilocaine (EMLA) cream Apply 30 -45 mins prior to port access. 50 g 0   Multiple Vitamins-Minerals (CENTRUM ADULTS) TABS Take 1 tablet by mouth daily.     NON FORMULARY Takes weekly allergy shots     pantoprazole (PROTONIX) 40 MG tablet Take 40 mg by mouth every morning.     rosuvastatin (CRESTOR) 40 MG tablet Take 40 mg by mouth daily.     meloxicam (MOBIC) 15 MG tablet Take 15 mg by mouth daily. (Patient not taking: Reported on 11/04/2021)     ondansetron (ZOFRAN) 4 MG tablet Take 1 tablet (4 mg total) by mouth every 8 (eight) hours as needed for nausea or vomiting. 20 tablet 0   prochlorperazine (COMPAZINE) 10 MG tablet Take 1 tablet (10 mg total) by mouth every 6 (six) hours as needed for nausea or vomiting. 40 tablet 1   No current facility-administered medications for this visit.   Facility-Administered Medications Ordered in Other Visits  Medication Dose Route Frequency Provider Last Rate Last Admin   heparin lock flush 100 unit/mL  500 Units Intracatheter Once PRN Cammie Sickle, MD          .  PHYSICAL EXAMINATION: ECOG PERFORMANCE STATUS: 0 - Asymptomatic  Vitals:   11/04/21 1027  BP: (!) 133/91  Pulse: 76  Temp: (!) 97.4 F (36.3 C)  SpO2: 100%   Filed Weights   11/04/21 1027  Weight: 186 lb 1.6 oz (84.4 kg)    Physical Exam HENT:     Head: Normocephalic and atraumatic.     Mouth/Throat:     Pharynx: No oropharyngeal exudate.  Eyes:     Pupils: Pupils are equal, round, and reactive to light.  Cardiovascular:     Rate and Rhythm: Normal rate and regular rhythm.  Pulmonary:     Effort: Pulmonary effort is normal. No respiratory distress.     Breath sounds: Normal breath sounds. No wheezing.  Abdominal:     General: Bowel sounds are normal. There is no distension.     Palpations: Abdomen is soft. There is no mass.      Tenderness: There is no abdominal tenderness. There is no guarding or rebound.  Musculoskeletal:        General: No tenderness. Normal range of motion.     Cervical back: Normal range of motion and neck supple.  Skin:    General: Skin is warm.  Neurological:     Mental Status: She is alert  and oriented to person, place, and time.  Psychiatric:        Mood and Affect: Affect normal.     LABORATORY DATA:  I have reviewed the data as listed Lab Results  Component Value Date   WBC 4.5 11/04/2021   HGB 12.2 11/04/2021   HCT 38.3 11/04/2021   MCV 79.1 (L) 11/04/2021   PLT 256 11/04/2021   Recent Labs    09/22/21 0827 10/13/21 0952 11/04/21 0938  NA 138 139 136  K 4.1 4.0 4.0  CL 106 106 103  CO2 '27 24 25  ' GLUCOSE 105* 93 106*  BUN 20 23* 17  CREATININE 1.01* 1.01* 0.87  CALCIUM 9.0 8.9 9.0  GFRNONAA >60 >60 >60  PROT 7.0 6.7 6.8  ALBUMIN 4.0 3.9 4.0  AST '23 23 25  ' ALT 33 30 35  ALKPHOS 73 63 72  BILITOT 0.4 0.3 0.5    RADIOGRAPHIC STUDIES: I have personally reviewed the radiological images as listed and agreed with the findings in the report. ECHOCARDIOGRAM COMPLETE  Result Date: 10/28/2021    ECHOCARDIOGRAM REPORT   Patient Name:   KATJA BLUE Date of Exam: 10/28/2021 Medical Rec #:  [id number]         Height:       68.0 in Accession #:    [id number]        Weight:       188.4 lb Date of Birth:  1966-08-28         BSA:          1.992 m Patient Age:    55 years          BP:           133/82 mmHg Patient Gender: F                 HR:           70 bpm. Exam Location:  [facility] Procedure: 2D Echo, Cardiac Doppler, Color Doppler and Strain Analysis Indications:     Chemo Z09  History:         Patient has no prior history of Echocardiogram examinations.                  Risk Factors:Hypertension. Breast Cancer.  Sonographer:     Sherrie Sport Referring Phys:  [place] BURNS Diagnosing Phys: Kathlyn Sacramento MD  Sonographer Comments: Global longitudinal strain was  attempted. IMPRESSIONS  1. Left ventricular ejection fraction, by estimation, is 55 to 60%. The left ventricle has normal function. The left ventricle has no regional wall motion abnormalities. Left ventricular diastolic parameters were normal. The average left ventricular global longitudinal strain is -19.0 %. The global longitudinal strain is normal.  2. Right ventricular systolic function is normal. The right ventricular size is normal. Tricuspid regurgitation signal is inadequate for assessing PA pressure.  3. The mitral valve is normal in structure. No evidence of mitral valve regurgitation. No evidence of mitral stenosis.  4. The aortic valve is normal in structure. Aortic valve regurgitation is not visualized. No aortic stenosis is present. FINDINGS  Left Ventricle: Left ventricular ejection fraction, by estimation, is 55 to 60%. The left ventricle has normal function. The left ventricle has no regional wall motion abnormalities. The average left ventricular global longitudinal strain is -19.0 %. The global longitudinal strain is normal. The left ventricular internal cavity size was normal in size. There is borderline left ventricular hypertrophy. Left ventricular diastolic parameters were normal.  Right Ventricle: The right ventricular size is normal. No increase in right ventricular wall thickness. Right ventricular systolic function is normal. Tricuspid regurgitation signal is inadequate for assessing PA pressure. Left Atrium: Left atrial size was normal in size. Right Atrium: Right atrial size was normal in size. Pericardium: There is no evidence of pericardial effusion. Mitral Valve: The mitral valve is normal in structure. No evidence of mitral valve regurgitation. No evidence of mitral valve stenosis. MV peak gradient, 2.1 mmHg. The mean mitral valve gradient is 1.0 mmHg. Tricuspid Valve: The tricuspid valve is normal in structure. Tricuspid valve regurgitation is not demonstrated. No evidence of  tricuspid stenosis. Aortic Valve: The aortic valve is normal in structure. Aortic valve regurgitation is not visualized. No aortic stenosis is present. Aortic valve mean gradient measures 4.0 mmHg. Aortic valve peak gradient measures 6.7 mmHg. Aortic valve area, by VTI measures 1.55 cm. Pulmonic Valve: The pulmonic valve was normal in structure. Pulmonic valve regurgitation is not visualized. No evidence of pulmonic stenosis. Aorta: The aortic root is normal in size and structure. Venous: The inferior vena cava was not well visualized. IAS/Shunts: No atrial level shunt detected by color flow Doppler.  LEFT VENTRICLE PLAX 2D LVIDd:         4.90 cm   Diastology LVIDs:         3.30 cm   LV e' medial:    7.18 cm/s LV PW:         1.50 cm   LV E/e' medial:  8.7 LV IVS:        0.90 cm   LV e' lateral:   13.30 cm/s LVOT diam:     2.00 cm   LV E/e' lateral: 4.7 LV SV:         42 LV SV Index:   21        2D Longitudinal Strain LVOT Area:     3.14 cm  2D Strain GLS Avg:     -19.0 %                           3D Volume EF:                          3D EF:        67 %                          LV EDV:       167 ml                          LV ESV:       54 ml                          LV SV:        113 ml RIGHT VENTRICLE RV Basal diam:  3.00 cm RV S prime:     11.30 cm/s LEFT ATRIUM             Index        RIGHT ATRIUM           Index LA diam:        3.30 cm 1.66 cm/m   RA Area:     14.40 cm LA Vol (A2C):   43.7 ml 21.93 ml/m  RA Volume:  34.10 ml  17.11 ml/m LA Vol (A4C):   51.5 ml 25.85 ml/m LA Biplane Vol: 48.0 ml 24.09 ml/m  AORTIC VALVE                    PULMONIC VALVE AV Area (Vmax):    1.50 cm     PV Vmax:        0.67 m/s AV Area (Vmean):   1.59 cm     PV Vmean:       43.100 cm/s AV Area (VTI):     1.55 cm     PV VTI:         0.136 m AV Vmax:           129.00 cm/s  PV Peak grad:   1.8 mmHg AV Vmean:          87.500 cm/s  PV Mean grad:   1.0 mmHg AV VTI:            0.274 m      RVOT Peak grad: 3 mmHg AV Peak Grad:       6.7 mmHg AV Mean Grad:      4.0 mmHg LVOT Vmax:         61.40 cm/s LVOT Vmean:        44.400 cm/s LVOT VTI:          0.135 m LVOT/AV VTI ratio: 0.49  AORTA Ao Root diam: 2.70 cm MITRAL VALVE               TRICUSPID VALVE MV Area (PHT): 4.12 cm    TR Peak grad:   10.5 mmHg MV Area VTI:   2.67 cm    TR Vmax:        162.00 cm/s MV Peak grad:  2.1 mmHg MV Mean grad:  1.0 mmHg    SHUNTS MV Vmax:       0.72 m/s    Systemic VTI:  0.14 m MV Vmean:      45.1 cm/s   Systemic Diam: 2.00 cm MV Decel Time: 184 msec    Pulmonic VTI:  0.196 m MV E velocity: 62.60 cm/s MV A velocity: 59.10 cm/s MV E/A ratio:  1.06 Kathlyn Sacramento MD Electronically signed by Kathlyn Sacramento MD Signature Date/Time: 10/28/2021/11:40:15 AM    Final     ASSESSMENT & PLAN:   Malignant neoplasm of lower-outer quadrant of left breast of female, estrogen receptor positive ([place]) # Left breast- CA- s/p mastectomy-  pT1c pN0 [Stage IA]Grade 3.  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  On adjuvant Herceptin. Started anastrozole [Oct 3435]; STABLE.   #Proceed with  Adjuvant/maintenace herceptin q3 W. Labs today reviewed;  acceptable for treatment today.  Will discontinue Benadryl.  AUG 2022- LVEF- 56.8 %.Comparable to LVEF equals 63.8% on 03/05/2021; DEC 2022- Left ventricular ejection fraction, by estimation, is 55 to 60%. The  left ventricle has normal function.STABLE.   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  # DISPOSITION: # herceptin today  # in 3 weeks- MD; labs- cbc/cmp; Herceptin;-Dr.B   All questions were answered. The patient/family knows to call the clinic with any problems, questions or concerns.    Cammie Sickle, MD 11/04/2021 12:15 PM

## 2021-11-04 NOTE — Patient Instructions (Signed)
[facility]  Discharge Instructions: ?Thank you for choosing [facility] to provide your oncology and hematology care.  ?If you have a lab appointment with the Cancer Center, please go directly to the Cancer Center and check in at the registration area. ? ?Wear comfortable clothing and clothing appropriate for easy access to any Portacath or PICC line.  ? ?We strive to give you quality time with your provider. You may need to reschedule your appointment if you arrive late (15 or more minutes).  Arriving late affects you and other patients whose appointments are after yours.  Also, if you miss three or more appointments without notifying the office, you may be dismissed from the clinic at the provider?s discretion.    ?  ?For prescription refill requests, have your pharmacy contact our office and allow 72 hours for refills to be completed.   ? ?  ?To help prevent nausea and vomiting after your treatment, we encourage you to take your nausea medication as directed. ? ?BELOW ARE SYMPTOMS THAT SHOULD BE REPORTED IMMEDIATELY: ?*FEVER GREATER THAN 100.4 F (38 ?C) OR HIGHER ?*CHILLS OR SWEATING ?*NAUSEA AND VOMITING THAT IS NOT CONTROLLED WITH YOUR NAUSEA MEDICATION ?*UNUSUAL SHORTNESS OF BREATH ?*UNUSUAL BRUISING OR BLEEDING ?*URINARY PROBLEMS (pain or burning when urinating, or frequent urination) ?*BOWEL PROBLEMS (unusual diarrhea, constipation, pain near the anus) ?TENDERNESS IN MOUTH AND THROAT WITH OR WITHOUT PRESENCE OF ULCERS (sore throat, sores in mouth, or a toothache) ?UNUSUAL RASH, SWELLING OR PAIN  ?UNUSUAL VAGINAL DISCHARGE OR ITCHING  ? ?Items with * indicate a potential emergency and should be followed up as soon as possible or go to the Emergency Department if any problems should occur. ? ?Please show the CHEMOTHERAPY ALERT CARD or IMMUNOTHERAPY ALERT CARD at check-in to the Emergency Department and triage nurse. ? ?Should you have questions after your visit  or need to cancel or reschedule your appointment, please contact [facility]  [phone] and follow the prompts.  Office hours are 8:00 a.m. to 4:30 p.m. Monday - Friday. Please note that voicemails left after 4:00 p.m. may not be returned until the following business day.  We are closed weekends and major holidays. You have access to a nurse at all times for urgent questions. Please call the main number to the clinic [phone] and follow the prompts. ? ?For any non-urgent questions, you may also contact your provider using MyChart. We now offer e-Visits for anyone 18 and older to request care online for non-urgent symptoms. For details visit [email or web address]. ?  ?Also download the MyChart app! Go to the app store, search "MyChart", open the app, select [facility], and log in with your MyChart username and password. ? ?Due to Covid, a mask is required upon entering the hospital/clinic. If you do not have a mask, one will be given to you upon arrival. For doctor visits, patients may have 1 support person aged 18 or older with them. For treatment visits, patients cannot have anyone with them due to current Covid guidelines and our immunocompromised population.  ?

## 2021-11-12 DIAGNOSIS — J301 Allergic rhinitis due to pollen: Secondary | ICD-10-CM | POA: Diagnosis not present

## 2021-11-13 DIAGNOSIS — J301 Allergic rhinitis due to pollen: Secondary | ICD-10-CM | POA: Diagnosis not present

## 2021-11-19 ENCOUNTER — Other Ambulatory Visit: Payer: Self-pay | Admitting: Internal Medicine

## 2021-11-20 DIAGNOSIS — J301 Allergic rhinitis due to pollen: Secondary | ICD-10-CM | POA: Diagnosis not present

## 2021-11-25 ENCOUNTER — Other Ambulatory Visit: Payer: Self-pay

## 2021-11-25 ENCOUNTER — Inpatient Hospital Stay (HOSPITAL_BASED_OUTPATIENT_CLINIC_OR_DEPARTMENT_OTHER): Payer: No Typology Code available for payment source | Admitting: Internal Medicine

## 2021-11-25 ENCOUNTER — Inpatient Hospital Stay: Payer: No Typology Code available for payment source | Attending: Internal Medicine

## 2021-11-25 ENCOUNTER — Encounter: Payer: Self-pay | Admitting: Internal Medicine

## 2021-11-25 ENCOUNTER — Inpatient Hospital Stay: Payer: No Typology Code available for payment source

## 2021-11-25 DIAGNOSIS — C50512 Malignant neoplasm of lower-outer quadrant of left female breast: Secondary | ICD-10-CM

## 2021-11-25 DIAGNOSIS — Z170 Estrogen receptor positive status [ER+]: Secondary | ICD-10-CM | POA: Diagnosis not present

## 2021-11-25 DIAGNOSIS — J45909 Unspecified asthma, uncomplicated: Secondary | ICD-10-CM | POA: Insufficient documentation

## 2021-11-25 DIAGNOSIS — I10 Essential (primary) hypertension: Secondary | ICD-10-CM | POA: Diagnosis not present

## 2021-11-25 DIAGNOSIS — Z5112 Encounter for antineoplastic immunotherapy: Secondary | ICD-10-CM | POA: Insufficient documentation

## 2021-11-25 LAB — CBC WITH DIFFERENTIAL/PLATELET
Abs Immature Granulocytes: 0.01 10*3/uL (ref 0.00–0.07)
Basophils Absolute: 0 10*3/uL (ref 0.0–0.1)
Basophils Relative: 1 %
Eosinophils Absolute: 0.5 10*3/uL (ref 0.0–0.5)
Eosinophils Relative: 9 %
HCT: 38.9 % (ref 36.0–46.0)
Hemoglobin: 12.4 g/dL (ref 12.0–15.0)
Immature Granulocytes: 0 %
Lymphocytes Relative: 31 %
Lymphs Abs: 1.6 10*3/uL (ref 0.7–4.0)
MCH: 25.3 pg — ABNORMAL LOW (ref 26.0–34.0)
MCHC: 31.9 g/dL (ref 30.0–36.0)
MCV: 79.4 fL — ABNORMAL LOW (ref 80.0–100.0)
Monocytes Absolute: 0.5 10*3/uL (ref 0.1–1.0)
Monocytes Relative: 9 %
Neutro Abs: 2.5 10*3/uL (ref 1.7–7.7)
Neutrophils Relative %: 50 %
Platelets: 258 10*3/uL (ref 150–400)
RBC: 4.9 MIL/uL (ref 3.87–5.11)
RDW: 16.1 % — ABNORMAL HIGH (ref 11.5–15.5)
WBC: 5 10*3/uL (ref 4.0–10.5)
nRBC: 0 % (ref 0.0–0.2)

## 2021-11-25 LAB — COMPREHENSIVE METABOLIC PANEL
ALT: 31 U/L (ref 0–44)
AST: 24 U/L (ref 15–41)
Albumin: 4.2 g/dL (ref 3.5–5.0)
Alkaline Phosphatase: 69 U/L (ref 38–126)
Anion gap: 7 (ref 5–15)
BUN: 23 mg/dL — ABNORMAL HIGH (ref 6–20)
CO2: 23 mmol/L (ref 22–32)
Calcium: 9.1 mg/dL (ref 8.9–10.3)
Chloride: 106 mmol/L (ref 98–111)
Creatinine, Ser: 0.94 mg/dL (ref 0.44–1.00)
GFR, Estimated: >60 mL/min (ref >60)
Glucose, Bld: 106 mg/dL — ABNORMAL HIGH (ref 70–99)
Potassium: 3.9 mmol/L (ref 3.5–5.1)
Sodium: 136 mmol/L (ref 135–145)
Total Bilirubin: 0.6 mg/dL (ref 0.3–1.2)
Total Protein: 7 g/dL (ref 6.5–8.1)

## 2021-11-25 MED ORDER — ACETAMINOPHEN 325 MG PO TABS
650.0000 mg | ORAL_TABLET | Freq: Once | ORAL | Status: AC
  Administered 2021-11-25: 650 mg via ORAL
  Filled 2021-11-25: qty 2

## 2021-11-25 MED ORDER — HEPARIN SOD (PORK) LOCK FLUSH 100 UNIT/ML IV SOLN
INTRAVENOUS | Status: AC
  Administered 2021-11-25: 500 [IU]
  Filled 2021-11-25: qty 5

## 2021-11-25 MED ORDER — TRASTUZUMAB-ANNS CHEMO 150 MG IV SOLR
6.0000 mg/kg | Freq: Once | INTRAVENOUS | Status: AC
  Administered 2021-11-25: 525 mg via INTRAVENOUS
  Filled 2021-11-25: qty 25

## 2021-11-25 MED ORDER — HEPARIN SOD (PORK) LOCK FLUSH 100 UNIT/ML IV SOLN
500.0000 [IU] | Freq: Once | INTRAVENOUS | Status: AC | PRN
  Filled 2021-11-25: qty 5

## 2021-11-25 MED ORDER — SODIUM CHLORIDE 0.9 % IV SOLN
Freq: Once | INTRAVENOUS | Status: AC
  Filled 2021-11-25: qty 250

## 2021-11-25 NOTE — Progress Notes (Signed)
Needs new FMLA forms filled out for this year.

## 2021-11-25 NOTE — Assessment & Plan Note (Addendum)
#  Left breast- CA- s/p mastectomy-  pT1c pN0 [Stage IA]Grade 3.  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  On adjuvant Herceptin. Started anastrozole [Oct 1994]; STABLE.   #Proceed with  Adjuvant/maintenace herceptin q3 W. Labs today reviewed;  acceptable for treatment today.  Will discontinue Benadryl.  AUG 2022- LVEF- 56.8 %.Comparable to LVEF equals 63.8% on 03/05/2021;DEC 2022- Left ventricular ejection fraction, by estimation, is 55 to 60%. The left ventricle has normal function.STABLE. will repeat March/April 2023-will oder at next visit.    #Discussed regarding spacing out visits; continue Herceptin every 3 weeks; labs; MD follow-up to be 6 weeks  # DISPOSITION: # herceptin today  # in 3 weeks- labs- cbc/cmp; Herceptin # in 6weeks- MD; labs- cbc/cmp; Herceptin; Dr.B

## 2021-11-25 NOTE — Progress Notes (Signed)
[name]  Patient Care Team: [name], MD as PCP - General (Internal Medicine) [name], RN as Registered Nurse  CHIEF COMPLAINTS/PURPOSE OF CONSULTATION: Breast cancer    Oncology History Overview Note  # April 2022-stage Ia mammary carcinoma ER/PR positive HER2 positive [TRIPLE positive]; negative margins s/p simple mastectomy with plan for immediate reconstruction [Dr.Rodenberg/[name]]; NO RT  # May 2nd, 2022- Taxol-Herceptin  # OCT 3rd, 2022- Anastrazole 65m/day [AUG 2022- BMD- WNL. ]  # MUGA scan- 64% [03/06/2021]-   # LMP- mid 2020; Uterine ablation- [phone]; # HTN; Asthma- well controlled [on allergy shots];.Marland Kitchen   Malignant neoplasm of lower-outer quadrant of left breast of female, estrogen receptor positive ([place]  12/25/2020 Initial Diagnosis   Malignant neoplasm of lower-outer quadrant of left breast of female, estrogen receptor positive ([facility] [place]   02/26/2021 Cancer Staging   Staging form: Breast, AJCC 8th Edition - Pathologic: Stage IA (pT1c, pN0, cM0, G3, ER+, PR+, HER2+) - Signed by BCammie Sickle MD on 02/26/2021 Mitotic count score: Score 3 Histologic grading system: 3 grade system    03/10/2021 -  Chemotherapy   Patient is on Treatment Plan : BREAST Paclitaxel + Trastuzumab q7d / Trastuzumab q21d      Genetic Testing   Negative genetic testing. No pathogenic variants identified on the [facility] Multi-Cancer+RNA panel. 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 The report date is 04/29/2021.  The Multi-Cancer Panel + RNA offered by [facility] includes sequencing and/or deletion duplication testing of the following 84 genes: AIP, ALK, APC, ATM, AXIN2,BAP1,  BARD1, BLM, BMPR1A, BRCA1, BRCA2, BRIP1, CASR, CDC73, CDH1, CDK4, CDKN1B, CDKN1C, CDKN2A (p14ARF), CDKN2A (p16INK4a), CEBPA, CHEK2, CTNNA1, DICER1, DIS3L2, EGFR (c.2369C>T, p.Thr790Met variant only), EPCAM (Deletion/duplication testing only), FH, FLCN, GATA2, GPC3, GREM1  (Promoter region deletion/duplication testing only), HOXB13 (c.251G>A, p.Gly84Glu), HRAS, KIT, MAX, MEN1, MET, MITF (c.952G>A, p.Glu318Lys variant only), MLH1, MSH2, MSH3, MSH6, MUTYH, NBN, NF1, NF2, NTHL1, PALB2, PDGFRA, PHOX2B, PMS2, POLD1, POLE, POT1, PRKAR1A, PTCH1, PTEN, RAD50, RAD51C, RAD51D, RB1, RECQL4, RET, RUNX1, SDHAF2, SDHA (sequence changes only), SDHB, SDHC, SDHD, SMAD4, SMARCA4, SMARCB1, SMARCE1, STK11, SUFU, TERC, TERT, TMEM127, TP53, TSC1, TSC2, VHL, WRN and WT1.      HISTORY OF PRESENTING ILLNESS: Alone.  Ambulating independently. Judy [id number].[id number].  female newly diagnosed breast cancer stage I ER/PR positive HER2/neu positive on adjuvant Herceptin; anastrazole is here for follow-up.  Denies any hot flashes.  Denies any worsening joint pains or bone pain.  She continues to be on anastrozole.   Review of Systems  Constitutional:  Negative for chills, diaphoresis, fever, malaise/fatigue and weight loss.  HENT:  Negative for nosebleeds and sore throat.   Eyes:  Negative for double vision.  Respiratory:  Negative for cough, hemoptysis, sputum production, shortness of breath and wheezing.   Cardiovascular:  Negative for chest pain, palpitations, orthopnea and leg swelling.  Gastrointestinal:  Negative for abdominal pain, blood in stool, constipation, diarrhea, heartburn, melena, nausea and vomiting.  Genitourinary:  Negative for dysuria, frequency and urgency.  Musculoskeletal:  Negative for back pain and joint pain.  Skin: Negative.  Negative for itching and rash.  Neurological:  Negative for dizziness, tingling, focal weakness, weakness and headaches.  Endo/Heme/Allergies:  Does not bruise/bleed easily.  Psychiatric/Behavioral:  Negative for depression. The patient is not nervous/anxious and does not have insomnia.     MEDICAL HISTORY:  Past Medical History:  Diagnosis Date   Asthma    well controlled   Breast cancer ([facility]  Family history of adverse reaction to  anesthesia    sister-hard time waking up   Family history of breast cancer    Family history of prostate cancer    Family history of uterine cancer    GERD (gastroesophageal reflux disease)    Hypertension     SURGICAL HISTORY: Past Surgical History:  Procedure Laterality Date   ABLATION     BREAST BIOPSY Left 2011   Benign per pt   BREAST BIOPSY Left 12/12/2020   3:30 3 cmfn, Q marker, path pending   BREAST BIOPSY Left 12/12/2020   3:30 1 cmfn, Vision marker, path pending   BREAST RECONSTRUCTION WITH PLACEMENT OF TISSUE EXPANDER AND FLEX HD (ACELLULAR HYDRATED DERMIS) Left 02/10/2021   Procedure: IMMEDIATE LEFT BREAST RECONSTRUCTION WITH PLACEMENT OF TISSUE EXPANDER AND FLEX HD (ACELLULAR HYDRATED DERMIS);  Surgeon: Wallace Going, DO;  Location: [facility];  Service: Plastics;  Laterality: Left;   COLONOSCOPY  01/15/2020   PORTACATH PLACEMENT Right 02/10/2021   Procedure: INSERTION PORT-A-CATH;  Surgeon: Ronny Bacon, MD;  Location: [facility];  Service: General;  Laterality: Right;   REMOVAL OF TISSUE EXPANDER AND PLACEMENT OF IMPLANT Left 07/09/2021   Procedure: REMOVAL OF TISSUE EXPANDER AND PLACEMENT OF IMPLANT LEFT BREAST;  Surgeon: Wallace Going, DO;  Location: [place];  Service: Plastics;  Laterality: Left;   SIMPLE MASTECTOMY WITH AXILLARY SENTINEL NODE BIOPSY Left 02/10/2021   Procedure: SIMPLE MASTECTOMY WITH AXILLARY SENTINEL NODE BIOPSY;  Surgeon: Ronny Bacon, MD;  Location: [facility];  Service: General;  Laterality: Left;    SOCIAL HISTORY: Social History   Socioeconomic History   Marital status: Married    Spouse name: Not on file   Number of children: Not on file   Years of education: Not on file   Highest education level: Not on file  Occupational History   Not on file  Tobacco Use   Smoking status: Never   Smokeless tobacco: Never  Vaping Use   Vaping Use: Never used  Substance and Sexual Activity   Alcohol use: Not  Currently   Drug use: Never   Sexual activity: Not on file  Other Topics Concern   Not on file  Social History Narrative   Lives in [place] with husband; kids- college. Works for [employer]- working from home. No smoking or alcohol.    Social Determinants of Health   Financial Resource Strain: Not on file  Food Insecurity: Not on file  Transportation Needs: Not on file  Physical Activity: Not on file  Stress: Not on file  Social Connections: Not on file  Intimate Partner Violence: Not on file    FAMILY HISTORY: Family History  Problem Relation Age of Onset   Hypertension Mother    Diabetes Mother    Hypertension Father    Diabetes Father    Cancer Father        prostate cancer-70s   Cancer Maternal Grandmother        uterine cancer   Breast cancer Sister        in in 32s.     ALLERGIES:  is allergic to shrimp extract allergy skin test and other.  MEDICATIONS:  Current Outpatient Medications  Medication Sig Dispense Refill   albuterol (VENTOLIN HFA) 108 (90 Base) MCG/ACT inhaler Inhale 1 puff into the lungs every 6 (six) hours as needed for shortness of breath.     amLODipine (NORVASC) 10 MG tablet Take 10 mg by mouth every morning.  anastrozole (ARIMIDEX) 1 MG tablet TAKE 1 TABLET BY MOUTH EVERY DAY 90 tablet 1   budesonide (PULMICORT) 0.5 MG/2ML nebulizer solution Take 2 mLs by nebulization daily.     cetirizine (ZYRTEC) 10 MG tablet Take 10 mg by mouth daily as needed for allergies.     Cholecalciferol 1.25 MG (50000 UT) capsule Take 50,000 Units by mouth once a week.     EPINEPHrine 0.3 mg/0.3 mL IJ SOAJ injection Inject 0.3 mg into the muscle as needed for anaphylaxis.     fluticasone (FLONASE) 50 MCG/ACT nasal spray Place 1 spray into both nostrils daily as needed for allergies.     fluticasone furoate-vilanterol (BREO ELLIPTA) 100-25 MCG/INH AEPB Inhale 1 puff into the lungs as needed for shortness of breath.     GNP BUDESONIDE NASAL SPRAY  NA Place 1 spray into the nose daily.     lidocaine-prilocaine (EMLA) cream Apply 30 -45 mins prior to port access. 50 g 0   meloxicam (MOBIC) 15 MG tablet Take 15 mg by mouth daily.     Multiple Vitamins-Minerals (CENTRUM ADULTS) TABS Take 1 tablet by mouth daily.     NON FORMULARY Takes weekly allergy shots     pantoprazole (PROTONIX) 40 MG tablet Take 40 mg by mouth every morning.     rosuvastatin (CRESTOR) 40 MG tablet Take 40 mg by mouth daily.     ondansetron (ZOFRAN) 4 MG tablet Take 1 tablet (4 mg total) by mouth every 8 (eight) hours as needed for nausea or vomiting. 20 tablet 0   prochlorperazine (COMPAZINE) 10 MG tablet Take 1 tablet (10 mg total) by mouth every 6 (six) hours as needed for nausea or vomiting. 40 tablet 1   No current facility-administered medications for this visit.   Facility-Administered Medications Ordered in Other Visits  Medication Dose Route Frequency Provider Last Rate Last Admin   heparin lock flush 100 UNIT/ML injection            heparin lock flush 100 unit/mL  500 Units Intracatheter Once PRN Cammie Sickle, MD       trastuzumab-[facility]) 525 mg in sodium chloride 0.9 % 250 mL chemo infusion  6 mg/kg (Treatment Plan Recorded) Intravenous Once Cammie Sickle, MD 550 mL/hr at 11/25/21 1012 525 mg at 11/25/21 1012      .  PHYSICAL EXAMINATION: ECOG PERFORMANCE STATUS: 0 - Asymptomatic  Vitals:   11/25/21 0900  BP: 131/84  Pulse: 96  Temp: (!) 97.5 F (36.4 C)  SpO2: 100%   Filed Weights   11/25/21 0900  Weight: 185 lb 3.2 oz (84 kg)    Physical Exam HENT:     Head: Normocephalic and atraumatic.     Mouth/Throat:     Pharynx: No oropharyngeal exudate.  Eyes:     Pupils: Pupils are equal, round, and reactive to light.  Cardiovascular:     Rate and Rhythm: Normal rate and regular rhythm.  Pulmonary:     Effort: Pulmonary effort is normal. No respiratory distress.     Breath sounds: Normal breath sounds. No wheezing.   Abdominal:     General: Bowel sounds are normal. There is no distension.     Palpations: Abdomen is soft. There is no mass.     Tenderness: There is no abdominal tenderness. There is no guarding or rebound.  Musculoskeletal:        General: No tenderness. Normal range of motion.     Cervical back: Normal range of motion and  neck supple.  Skin:    General: Skin is warm.  Neurological:     Mental Status: She is alert and oriented to person, place, and time.  Psychiatric:        Mood and Affect: Affect normal.     LABORATORY DATA:  I have reviewed the data as listed Lab Results  Component Value Date   WBC 5.0 11/25/2021   HGB 12.4 11/25/2021   HCT 38.9 11/25/2021   MCV 79.4 (L) 11/25/2021   PLT 258 11/25/2021   Recent Labs    10/13/21 0952 11/04/21 0938 11/25/21 0847  NA 139 136 136  K 4.0 4.0 3.9  CL 106 103 106  CO2 '24 25 23  ' GLUCOSE 93 106* 106*  BUN 23* 17 23*  CREATININE 1.01* 0.87 0.94  CALCIUM 8.9 9.0 9.1  GFRNONAA >60 >60 >60  PROT 6.7 6.8 7.0  ALBUMIN 3.9 4.0 4.2  AST '23 25 24  ' ALT 30 35 31  ALKPHOS 63 72 69  BILITOT 0.3 0.5 0.6    RADIOGRAPHIC STUDIES: I have personally reviewed the radiological images as listed and agreed with the findings in the report. ECHOCARDIOGRAM COMPLETE  Result Date: 10/28/2021    ECHOCARDIOGRAM REPORT   Patient Name:   Judy Padilla Date of Exam: 10/28/2021 Medical Rec #:  [id number]         Height:       68.0 in Accession #:    [id number]        Weight:       188.4 lb Date of Birth:  07-Aug-1966         BSA:          1.992 m Patient Age:    43 years          BP:           133/82 mmHg Patient Gender: F                 HR:           70 bpm. Exam Location:  [facility] Procedure: 2D Echo, Cardiac Doppler, Color Doppler and Strain Analysis Indications:     Chemo Z09  History:         Patient has no prior history of Echocardiogram examinations.                  Risk Factors:Hypertension. Breast Cancer.  Sonographer:     Sherrie Sport  Referring Phys:  [place] BURNS Diagnosing Phys: Kathlyn Sacramento MD  Sonographer Comments: Global longitudinal strain was attempted. IMPRESSIONS  1. Left ventricular ejection fraction, by estimation, is 55 to 60%. The left ventricle has normal function. The left ventricle has no regional wall motion abnormalities. Left ventricular diastolic parameters were normal. The average left ventricular global longitudinal strain is -19.0 %. The global longitudinal strain is normal.  2. Right ventricular systolic function is normal. The right ventricular size is normal. Tricuspid regurgitation signal is inadequate for assessing PA pressure.  3. The mitral valve is normal in structure. No evidence of mitral valve regurgitation. No evidence of mitral stenosis.  4. The aortic valve is normal in structure. Aortic valve regurgitation is not visualized. No aortic stenosis is present. FINDINGS  Left Ventricle: Left ventricular ejection fraction, by estimation, is 55 to 60%. The left ventricle has normal function. The left ventricle has no regional wall motion abnormalities. The average left ventricular global longitudinal strain is -19.0 %. The global longitudinal strain is normal.  The left ventricular internal cavity size was normal in size. There is borderline left ventricular hypertrophy. Left ventricular diastolic parameters were normal. Right Ventricle: The right ventricular size is normal. No increase in right ventricular wall thickness. Right ventricular systolic function is normal. Tricuspid regurgitation signal is inadequate for assessing PA pressure. Left Atrium: Left atrial size was normal in size. Right Atrium: Right atrial size was normal in size. Pericardium: There is no evidence of pericardial effusion. Mitral Valve: The mitral valve is normal in structure. No evidence of mitral valve regurgitation. No evidence of mitral valve stenosis. MV peak gradient, 2.1 mmHg. The mean mitral valve gradient is 1.0 mmHg.  Tricuspid Valve: The tricuspid valve is normal in structure. Tricuspid valve regurgitation is not demonstrated. No evidence of tricuspid stenosis. Aortic Valve: The aortic valve is normal in structure. Aortic valve regurgitation is not visualized. No aortic stenosis is present. Aortic valve mean gradient measures 4.0 mmHg. Aortic valve peak gradient measures 6.7 mmHg. Aortic valve area, by VTI measures 1.55 cm. Pulmonic Valve: The pulmonic valve was normal in structure. Pulmonic valve regurgitation is not visualized. No evidence of pulmonic stenosis. Aorta: The aortic root is normal in size and structure. Venous: The inferior vena cava was not well visualized. IAS/Shunts: No atrial level shunt detected by color flow Doppler.  LEFT VENTRICLE PLAX 2D LVIDd:         4.90 cm   Diastology LVIDs:         3.30 cm   LV e' medial:    7.18 cm/s LV PW:         1.50 cm   LV E/e' medial:  8.7 LV IVS:        0.90 cm   LV e' lateral:   13.30 cm/s LVOT diam:     2.00 cm   LV E/e' lateral: 4.7 LV SV:         42 LV SV Index:   21        2D Longitudinal Strain LVOT Area:     3.14 cm  2D Strain GLS Avg:     -19.0 %                           3D Volume EF:                          3D EF:        67 %                          LV EDV:       167 ml                          LV ESV:       54 ml                          LV SV:        113 ml RIGHT VENTRICLE RV Basal diam:  3.00 cm RV S prime:     11.30 cm/s LEFT ATRIUM             Index        RIGHT ATRIUM           Index LA diam:        3.30 cm 1.66 cm/m  RA Area:     14.40 cm LA Vol (A2C):   43.7 ml 21.93 ml/m  RA Volume:   34.10 ml  17.11 ml/m LA Vol (A4C):   51.5 ml 25.85 ml/m LA Biplane Vol: 48.0 ml 24.09 ml/m  AORTIC VALVE                    PULMONIC VALVE AV Area (Vmax):    1.50 cm     PV Vmax:        0.67 m/s AV Area (Vmean):   1.59 cm     PV Vmean:       43.100 cm/s AV Area (VTI):     1.55 cm     PV VTI:         0.136 m AV Vmax:           129.00 cm/s  PV Peak grad:   1.8  mmHg AV Vmean:          87.500 cm/s  PV Mean grad:   1.0 mmHg AV VTI:            0.274 m      RVOT Peak grad: 3 mmHg AV Peak Grad:      6.7 mmHg AV Mean Grad:      4.0 mmHg LVOT Vmax:         61.40 cm/s LVOT Vmean:        44.400 cm/s LVOT VTI:          0.135 m LVOT/AV VTI ratio: 0.49  AORTA Ao Root diam: 2.70 cm MITRAL VALVE               TRICUSPID VALVE MV Area (PHT): 4.12 cm    TR Peak grad:   10.5 mmHg MV Area VTI:   2.67 cm    TR Vmax:        162.00 cm/s MV Peak grad:  2.1 mmHg MV Mean grad:  1.0 mmHg    SHUNTS MV Vmax:       0.72 m/s    Systemic VTI:  0.14 m MV Vmean:      45.1 cm/s   Systemic Diam: 2.00 cm MV Decel Time: 184 msec    Pulmonic VTI:  0.196 m MV E velocity: 62.60 cm/s MV A velocity: 59.10 cm/s MV E/A ratio:  1.06 Kathlyn Sacramento MD Electronically signed by Kathlyn Sacramento MD Signature Date/Time: 10/28/2021/11:40:15 AM    Final     ASSESSMENT & PLAN:   Malignant neoplasm of lower-outer quadrant of left breast of female, estrogen receptor positive ([place]) # Left breast- CA- s/p mastectomy-  pT1c pN0 [Stage IA]Grade 3.  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  On adjuvant Herceptin. Started anastrozole [Oct 1117]; STABLE.   #Proceed with  Adjuvant/maintenace herceptin q3 W. Labs today reviewed;  acceptable for treatment today.  Will discontinue Benadryl.  AUG 2022- LVEF- 56.8 %.Comparable to LVEF equals 63.8% on 03/05/2021; DEC 2022- Left ventricular ejection fraction, by estimation, is 55 to 60%. The left ventricle has normal function.STABLE. will repeat March/April 2023-will oder at next visit.    #Discussed regarding spacing out visits; continue Herceptin every 3 weeks; labs; MD follow-up to be 6 weeks  # DISPOSITION: # herceptin today  # in 3 weeks- labs- cbc/cmp; Herceptin # in 6weeks- MD; labs- cbc/cmp; Herceptin; Dr.B   All questions were answered. The patient/family knows to call the clinic with any problems, questions or concerns.    Cammie Sickle, MD 11/25/2021 10:37  AM

## 2021-11-25 NOTE — Patient Instructions (Signed)
[facility] AT [place]  Discharge Instructions: Thank you for choosing [facility] to provide your oncology and hematology care.  If you have a lab appointment with the [facility], please go directly to the [facility] and check in at the registration area.  Wear comfortable clothing and clothing appropriate for easy access to any Portacath or PICC line.   We strive to give you quality time with your provider. You may need to reschedule your appointment if you arrive late (15 or more minutes).  Arriving late affects you and other patients whose appointments are after yours.  Also, if you miss three or more appointments without notifying the office, you may be dismissed from the clinic at the providers discretion.      For prescription refill requests, have your pharmacy contact our office and allow 72 hours for refills to be completed.    Today you received the following chemotherapy and/or immunotherapy agents [place]      To help prevent nausea and vomiting after your treatment, we encourage you to take your nausea medication as directed.  BELOW ARE SYMPTOMS THAT SHOULD BE REPORTED IMMEDIATELY: *FEVER GREATER THAN 100.4 F (38 C) OR HIGHER *CHILLS OR SWEATING *NAUSEA AND VOMITING THAT IS NOT CONTROLLED WITH YOUR NAUSEA MEDICATION *UNUSUAL SHORTNESS OF BREATH *UNUSUAL BRUISING OR BLEEDING *URINARY PROBLEMS (pain or burning when urinating, or frequent urination) *BOWEL PROBLEMS (unusual diarrhea, constipation, pain near the anus) TENDERNESS IN MOUTH AND THROAT WITH OR WITHOUT PRESENCE OF ULCERS (sore throat, sores in mouth, or a toothache) UNUSUAL RASH, SWELLING OR PAIN  UNUSUAL VAGINAL DISCHARGE OR ITCHING   Items with * indicate a potential emergency and should be followed up as soon as possible or go to the Emergency Department if any problems should occur.  Please show the CHEMOTHERAPY ALERT CARD or IMMUNOTHERAPY ALERT CARD at check-in to  the Emergency Department and triage nurse.  Should you have questions after your visit or need to cancel or reschedule your appointment, please contact [facility] [place] [facility] [place]  [phone] and follow the prompts.  Office hours are 8:00 a.m. to 4:30 p.m. Monday - Friday. Please note that voicemails left after 4:00 p.m. may not be returned until the following business day.  We are closed weekends and major holidays. You have access to a nurse at all times for urgent questions. Please call the main number to the clinic [phone] and follow the prompts.  For any non-urgent questions, you may also contact your provider using MyChart. We now offer e-Visits for anyone 74 and older to request care online for non-urgent symptoms. For details visit [email or web address].   Also download the MyChart app! Go to the app store, search "MyChart", open the app, select [facility], and log in with your MyChart username and password.  Due to Covid, a mask is required upon entering the hospital/clinic. If you do not have a mask, one will be given to you upon arrival. For doctor visits, patients may have 1 support person aged 75 or older with them. For treatment visits, patients cannot have anyone with them due to current Covid guidelines and our immunocompromised population.

## 2021-11-27 DIAGNOSIS — J301 Allergic rhinitis due to pollen: Secondary | ICD-10-CM | POA: Diagnosis not present

## 2021-12-11 DIAGNOSIS — J301 Allergic rhinitis due to pollen: Secondary | ICD-10-CM | POA: Diagnosis not present

## 2021-12-16 ENCOUNTER — Other Ambulatory Visit: Payer: Self-pay

## 2021-12-16 ENCOUNTER — Inpatient Hospital Stay: Payer: No Typology Code available for payment source

## 2021-12-16 ENCOUNTER — Other Ambulatory Visit: Payer: Self-pay | Admitting: Internal Medicine

## 2021-12-16 ENCOUNTER — Inpatient Hospital Stay: Payer: No Typology Code available for payment source | Attending: Internal Medicine

## 2021-12-16 VITALS — BP 125/82 | HR 94 | Temp 98.4°F | Ht 68.0 in | Wt 184.0 lb

## 2021-12-16 DIAGNOSIS — Z170 Estrogen receptor positive status [ER+]: Secondary | ICD-10-CM | POA: Insufficient documentation

## 2021-12-16 DIAGNOSIS — Z79811 Long term (current) use of aromatase inhibitors: Secondary | ICD-10-CM | POA: Insufficient documentation

## 2021-12-16 DIAGNOSIS — C50512 Malignant neoplasm of lower-outer quadrant of left female breast: Secondary | ICD-10-CM | POA: Diagnosis not present

## 2021-12-16 DIAGNOSIS — Z5112 Encounter for antineoplastic immunotherapy: Secondary | ICD-10-CM | POA: Diagnosis not present

## 2021-12-16 DIAGNOSIS — Z9013 Acquired absence of bilateral breasts and nipples: Secondary | ICD-10-CM | POA: Insufficient documentation

## 2021-12-16 LAB — CBC WITH DIFFERENTIAL/PLATELET
Abs Immature Granulocytes: 0.03 10*3/uL (ref 0.00–0.07)
Basophils Absolute: 0 10*3/uL (ref 0.0–0.1)
Basophils Relative: 0 %
Eosinophils Absolute: 0.3 10*3/uL (ref 0.0–0.5)
Eosinophils Relative: 6 %
HCT: 37.3 % (ref 36.0–46.0)
Hemoglobin: 12 g/dL (ref 12.0–15.0)
Immature Granulocytes: 1 %
Lymphocytes Relative: 28 %
Lymphs Abs: 1.4 10*3/uL (ref 0.7–4.0)
MCH: 26 pg (ref 26.0–34.0)
MCHC: 32.2 g/dL (ref 30.0–36.0)
MCV: 80.7 fL (ref 80.0–100.0)
Monocytes Absolute: 0.4 10*3/uL (ref 0.1–1.0)
Monocytes Relative: 8 %
Neutro Abs: 2.9 10*3/uL (ref 1.7–7.7)
Neutrophils Relative %: 57 %
Platelets: 264 10*3/uL (ref 150–400)
RBC: 4.62 MIL/uL (ref 3.87–5.11)
RDW: 15.8 % — ABNORMAL HIGH (ref 11.5–15.5)
WBC: 5 10*3/uL (ref 4.0–10.5)
nRBC: 0 % (ref 0.0–0.2)

## 2021-12-16 LAB — COMPREHENSIVE METABOLIC PANEL
ALT: 32 U/L (ref 0–44)
AST: 23 U/L (ref 15–41)
Albumin: 3.9 g/dL (ref 3.5–5.0)
Alkaline Phosphatase: 67 U/L (ref 38–126)
Anion gap: 8 (ref 5–15)
BUN: 17 mg/dL (ref 6–20)
CO2: 24 mmol/L (ref 22–32)
Calcium: 9 mg/dL (ref 8.9–10.3)
Chloride: 106 mmol/L (ref 98–111)
Creatinine, Ser: 0.89 mg/dL (ref 0.44–1.00)
GFR, Estimated: >60 mL/min (ref >60)
Glucose, Bld: 109 mg/dL — ABNORMAL HIGH (ref 70–99)
Potassium: 3.8 mmol/L (ref 3.5–5.1)
Sodium: 138 mmol/L (ref 135–145)
Total Bilirubin: 0.5 mg/dL (ref 0.3–1.2)
Total Protein: 6.4 g/dL — ABNORMAL LOW (ref 6.5–8.1)

## 2021-12-16 MED ORDER — SODIUM CHLORIDE 0.9 % IV SOLN
Freq: Once | INTRAVENOUS | Status: AC
  Filled 2021-12-16: qty 250

## 2021-12-16 MED ORDER — ACETAMINOPHEN 325 MG PO TABS
650.0000 mg | ORAL_TABLET | Freq: Once | ORAL | Status: AC
  Administered 2021-12-16: 650 mg via ORAL
  Filled 2021-12-16: qty 2

## 2021-12-16 MED ORDER — HEPARIN SOD (PORK) LOCK FLUSH 100 UNIT/ML IV SOLN
500.0000 [IU] | Freq: Once | INTRAVENOUS | Status: AC | PRN
  Administered 2021-12-16: 500 [IU]
  Filled 2021-12-16: qty 5

## 2021-12-16 MED ORDER — TRASTUZUMAB-ANNS CHEMO 150 MG IV SOLR
6.0000 mg/kg | Freq: Once | INTRAVENOUS | Status: AC
  Administered 2021-12-16: 525 mg via INTRAVENOUS
  Filled 2021-12-16: qty 25

## 2021-12-16 NOTE — Patient Instructions (Signed)
[facility] CANCER CTR AT [place]  Discharge Instructions: Thank you for choosing [facility] to provide your oncology and hematology care.  If you have a lab appointment with the [facility], please go directly to the [facility] [place] and check in at the registration area.  Wear comfortable clothing and clothing appropriate for easy access to any Portacath or PICC line.   We strive to give you quality time with your provider. You may need to reschedule your appointment if you arrive late (15 or more minutes).  Arriving late affects you and other patients whose appointments are after yours.  Also, if you miss three or more appointments without notifying the office, you may be dismissed from the clinic at the providers discretion.      For prescription refill requests, have your pharmacy contact our office and allow 72 hours for refills to be completed.    Today you received the following chemotherapy and/or immunotherapy agents HERCEPTIN      To help prevent nausea and vomiting after your treatment, we encourage you to take your nausea medication as directed.  BELOW ARE SYMPTOMS THAT SHOULD BE REPORTED IMMEDIATELY: *FEVER GREATER THAN 100.4 F (38 C) OR HIGHER *CHILLS OR SWEATING *NAUSEA AND VOMITING THAT IS NOT CONTROLLED WITH YOUR NAUSEA MEDICATION *UNUSUAL SHORTNESS OF BREATH *UNUSUAL BRUISING OR BLEEDING *URINARY PROBLEMS (pain or burning when urinating, or frequent urination) *BOWEL PROBLEMS (unusual diarrhea, constipation, pain near the anus) TENDERNESS IN MOUTH AND THROAT WITH OR WITHOUT PRESENCE OF ULCERS (sore throat, sores in mouth, or a toothache) UNUSUAL RASH, SWELLING OR PAIN  UNUSUAL VAGINAL DISCHARGE OR ITCHING   Items with * indicate a potential emergency and should be followed up as soon as possible or go to the Emergency Department if any problems should occur.  Please show the CHEMOTHERAPY ALERT CARD or IMMUNOTHERAPY ALERT CARD at check-in to  the Emergency Department and triage nurse.  Should you have questions after your visit or need to cancel or reschedule your appointment, please contact [facility] CANCER [place] AT [place]  [phone] and follow the prompts.  Office hours are 8:00 a.m. to 4:30 p.m. Monday - Friday. Please note that voicemails left after 4:00 p.m. may not be returned until the following business day.  We are closed weekends and major holidays. You have access to a nurse at all times for urgent questions. Please call the main number to the clinic [phone] and follow the prompts.  For any non-urgent questions, you may also contact your provider using MyChart. We now offer e-Visits for anyone 54 and older to request care online for non-urgent symptoms. For details visit [email or web address].   Also download the MyChart app! Go to the app store, search "MyChart", open the app, select [facility], and log in with your MyChart username and password.  Due to Covid, a mask is required upon entering the hospital/clinic. If you do not have a mask, one will be given to you upon arrival. For doctor visits, patients may have 1 support person aged 65 or older with them. For treatment visits, patients cannot have anyone with them due to current Covid guidelines and our immunocompromised population.   Trastuzumab injection for infusion What is this medication? TRASTUZUMAB (tras TOO zoo mab) is a monoclonal antibody. It is used to treat breast cancer and stomach cancer. This medicine may be used for other purposes; ask your health care provider or pharmacist if you have questions. COMMON BRAND NAME(S): Herceptin, Belenda Cruise, Ogivri, Chalmers Guest  What should I tell my care team before I take this medication? They need to know if you have any of these conditions: heart disease heart failure lung or breathing disease, like asthma an unusual or allergic reaction to trastuzumab, benzyl alcohol, or other  medications, foods, dyes, or preservatives pregnant or trying to get pregnant breast-feeding How should I use this medication? This drug is given as an infusion into a vein. It is administered in a hospital or clinic by a specially trained health care professional. Talk to your pediatrician regarding the use of this medicine in children. This medicine is not approved for use in children. Overdosage: If you think you have taken too much of this medicine contact a poison control center or emergency room at once. NOTE: This medicine is only for you. Do not share this medicine with others. What if I miss a dose? It is important not to miss a dose. Call your doctor or health care professional if you are unable to keep an appointment. What may interact with this medication? This medicine may interact with the following medications: certain types of chemotherapy, such as daunorubicin, doxorubicin, epirubicin, and idarubicin This list may not describe all possible interactions. Give your health care provider a list of all the medicines, herbs, non-prescription drugs, or dietary supplements you use. Also tell them if you smoke, drink alcohol, or use illegal drugs. Some items may interact with your medicine. What should I watch for while using this medication? Visit your doctor for checks on your progress. Report any side effects. Continue your course of treatment even though you feel ill unless your doctor tells you to stop. Call your doctor or health care professional for advice if you get a fever, chills or sore throat, or other symptoms of a cold or flu. Do not treat yourself. Try to avoid being around people who are sick. You may experience fever, chills and shaking during your first infusion. These effects are usually mild and can be treated with other medicines. Report any side effects during the infusion to your health care professional. Fever and chills usually do not happen with later infusions. Do  not become pregnant while taking this medicine or for 7 months after stopping it. Women should inform their doctor if they wish to become pregnant or think they might be pregnant. Women of child-bearing potential will need to have a negative pregnancy test before starting this medicine. There is a potential for serious side effects to an unborn child. Talk to your health care professional or pharmacist for more information. Do not breast-feed an infant while taking this medicine or for 7 months after stopping it. Women must use effective birth control with this medicine. What side effects may I notice from receiving this medication? Side effects that you should report to your doctor or health care professional as soon as possible: allergic reactions like skin rash, itching or hives, swelling of the face, lips, or tongue chest pain or palpitations cough dizziness feeling faint or lightheaded, falls fever general ill feeling or flu-like symptoms signs of worsening heart failure like breathing problems; swelling in your legs and feet unusually weak or tired Side effects that usually do not require medical attention (report to your doctor or health care professional if they continue or are bothersome): bone pain changes in taste diarrhea joint pain nausea/vomiting weight loss This list may not describe all possible side effects. Call your doctor for medical advice about side effects. You may report side effects to  FDA at 1-800-FDA-1088. Where should I keep my medication? This drug is given in a hospital or clinic and will not be stored at home. NOTE: This sheet is a summary. It may not cover all possible information. If you have questions about this medicine, talk to your doctor, pharmacist, or health care provider.  2022 Elsevier/Gold Standard (2016-11-10 00:00:00)

## 2021-12-18 DIAGNOSIS — J301 Allergic rhinitis due to pollen: Secondary | ICD-10-CM | POA: Diagnosis not present

## 2021-12-31 DIAGNOSIS — M25562 Pain in left knee: Secondary | ICD-10-CM | POA: Diagnosis not present

## 2021-12-31 DIAGNOSIS — M25561 Pain in right knee: Secondary | ICD-10-CM | POA: Diagnosis not present

## 2022-01-01 DIAGNOSIS — J301 Allergic rhinitis due to pollen: Secondary | ICD-10-CM | POA: Diagnosis not present

## 2022-01-06 ENCOUNTER — Inpatient Hospital Stay: Payer: No Typology Code available for payment source

## 2022-01-06 ENCOUNTER — Inpatient Hospital Stay (HOSPITAL_BASED_OUTPATIENT_CLINIC_OR_DEPARTMENT_OTHER): Payer: No Typology Code available for payment source | Admitting: Internal Medicine

## 2022-01-06 ENCOUNTER — Telehealth: Payer: Self-pay | Admitting: *Deleted

## 2022-01-06 ENCOUNTER — Other Ambulatory Visit: Payer: Self-pay

## 2022-01-06 VITALS — BP 131/86 | HR 76 | Temp 98.0°F | Wt 185.8 lb

## 2022-01-06 DIAGNOSIS — C50512 Malignant neoplasm of lower-outer quadrant of left female breast: Secondary | ICD-10-CM

## 2022-01-06 DIAGNOSIS — Z5112 Encounter for antineoplastic immunotherapy: Secondary | ICD-10-CM | POA: Diagnosis not present

## 2022-01-06 DIAGNOSIS — Z5111 Encounter for antineoplastic chemotherapy: Secondary | ICD-10-CM

## 2022-01-06 DIAGNOSIS — Z9013 Acquired absence of bilateral breasts and nipples: Secondary | ICD-10-CM | POA: Diagnosis not present

## 2022-01-06 DIAGNOSIS — Z9221 Personal history of antineoplastic chemotherapy: Secondary | ICD-10-CM

## 2022-01-06 DIAGNOSIS — Z79811 Long term (current) use of aromatase inhibitors: Secondary | ICD-10-CM | POA: Diagnosis not present

## 2022-01-06 DIAGNOSIS — Z170 Estrogen receptor positive status [ER+]: Secondary | ICD-10-CM

## 2022-01-06 LAB — CBC WITH DIFFERENTIAL/PLATELET
Abs Immature Granulocytes: 0.02 10*3/uL (ref 0.00–0.07)
Basophils Absolute: 0 10*3/uL (ref 0.0–0.1)
Basophils Relative: 1 %
Eosinophils Absolute: 0.5 10*3/uL (ref 0.0–0.5)
Eosinophils Relative: 8 %
HCT: 38.8 % (ref 36.0–46.0)
Hemoglobin: 12.5 g/dL (ref 12.0–15.0)
Immature Granulocytes: 0 %
Lymphocytes Relative: 29 %
Lymphs Abs: 1.8 10*3/uL (ref 0.7–4.0)
MCH: 26.3 pg (ref 26.0–34.0)
MCHC: 32.2 g/dL (ref 30.0–36.0)
MCV: 81.7 fL (ref 80.0–100.0)
Monocytes Absolute: 0.5 10*3/uL (ref 0.1–1.0)
Monocytes Relative: 9 %
Neutro Abs: 3.3 10*3/uL (ref 1.7–7.7)
Neutrophils Relative %: 53 %
Platelets: 233 10*3/uL (ref 150–400)
RBC: 4.75 MIL/uL (ref 3.87–5.11)
RDW: 15.4 % (ref 11.5–15.5)
WBC: 6.1 10*3/uL (ref 4.0–10.5)
nRBC: 0 % (ref 0.0–0.2)

## 2022-01-06 LAB — COMPREHENSIVE METABOLIC PANEL
ALT: 33 U/L (ref 0–44)
AST: 25 U/L (ref 15–41)
Albumin: 4.1 g/dL (ref 3.5–5.0)
Alkaline Phosphatase: 75 U/L (ref 38–126)
Anion gap: 5 (ref 5–15)
BUN: 19 mg/dL (ref 6–20)
CO2: 26 mmol/L (ref 22–32)
Calcium: 9.2 mg/dL (ref 8.9–10.3)
Chloride: 105 mmol/L (ref 98–111)
Creatinine, Ser: 0.97 mg/dL (ref 0.44–1.00)
GFR, Estimated: >60 mL/min (ref >60)
Glucose, Bld: 97 mg/dL (ref 70–99)
Potassium: 3.8 mmol/L (ref 3.5–5.1)
Sodium: 136 mmol/L (ref 135–145)
Total Bilirubin: 0.5 mg/dL (ref 0.3–1.2)
Total Protein: 7 g/dL (ref 6.5–8.1)

## 2022-01-06 MED ORDER — TRASTUZUMAB-ANNS CHEMO 150 MG IV SOLR
6.0000 mg/kg | Freq: Once | INTRAVENOUS | Status: AC
  Administered 2022-01-06: 525 mg via INTRAVENOUS
  Filled 2022-01-06: qty 25

## 2022-01-06 MED ORDER — ACETAMINOPHEN 325 MG PO TABS
650.0000 mg | ORAL_TABLET | Freq: Once | ORAL | Status: AC
  Administered 2022-01-06: 650 mg via ORAL
  Filled 2022-01-06: qty 2

## 2022-01-06 MED ORDER — HEPARIN SOD (PORK) LOCK FLUSH 100 UNIT/ML IV SOLN
INTRAVENOUS | Status: AC
  Administered 2022-01-06: 500 [IU]
  Filled 2022-01-06: qty 5

## 2022-01-06 MED ORDER — SODIUM CHLORIDE 0.9 % IV SOLN
Freq: Once | INTRAVENOUS | Status: AC
  Filled 2022-01-06: qty 250

## 2022-01-06 NOTE — Telephone Encounter (Signed)
Fmla received on 01/06/22 by patient. Forms signed by Dr. Rogue Bussing and faxed to [facility] leave of absence dept. Fax confirmation rcvd.

## 2022-01-06 NOTE — Assessment & Plan Note (Signed)
#  Left breast- CA- s/p mastectomy-  pT1c pN0 [Stage IA]Grade 3.  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  On adjuvant Herceptin. Started anastrozole [Oct 1947]; STABLE.   #Proceed with  Adjuvant/maintenace herceptin q3 W. Labs today reviewed;  acceptable for treatment today.  Will discontinue Benadryl.  AUG 2022- LVEF- 56.8 %.Comparable to LVEF equals 63.8% on 03/05/2021;DEC 2022- Left ventricular ejection fraction, by estimation, is 55 to 60%. The left ventricle has normal function. STABLE; will repeat again inApril 2023.  # Musculoskeletal symptoms- G-1-2 sec to Anastrozole; continue ca+vit D; Osteobiflex; if worse   # IV access: # port/IV access- Stable; discussed re: pro and cons of keeping the port vs. Explantation- will inform Dr.Rodenberg   In 6 weeks- last Rx # DISPOSITION: # herceptin today  # refer to Dr.Rodenberg re: port explantation [after April 11th, 2023] # in 3 weeks- labs- cbc/cmp; vit D level- 25-OH; Herceptin # in 6weeks- MD; labs- cbc/cmp; Herceptin; MUGA scan prior-Dr.B

## 2022-01-06 NOTE — Patient Instructions (Signed)
[facility] CANCER CTR AT [place]  Discharge Instructions: Thank you for choosing [facility] to provide your oncology and hematology care.  If you have a lab appointment with the [facility], please go directly to the [facility] and check in at the registration area.  Wear comfortable clothing and clothing appropriate for easy access to any Portacath or PICC line.   We strive to give you quality time with your provider. You may need to reschedule your appointment if you arrive late (15 or more minutes).  Arriving late affects you and other patients whose appointments are after yours.  Also, if you miss three or more appointments without notifying the office, you may be dismissed from the clinic at the providers discretion.      For prescription refill requests, have your pharmacy contact our office and allow 72 hours for refills to be completed.    Today you received the following chemotherapy and/or immunotherapy agents : Herceptin     To help prevent nausea and vomiting after your treatment, we encourage you to take your nausea medication as directed.  BELOW ARE SYMPTOMS THAT SHOULD BE REPORTED IMMEDIATELY: *FEVER GREATER THAN 100.4 F (38 C) OR HIGHER *CHILLS OR SWEATING *NAUSEA AND VOMITING THAT IS NOT CONTROLLED WITH YOUR NAUSEA MEDICATION *UNUSUAL SHORTNESS OF BREATH *UNUSUAL BRUISING OR BLEEDING *URINARY PROBLEMS (pain or burning when urinating, or frequent urination) *BOWEL PROBLEMS (unusual diarrhea, constipation, pain near the anus) TENDERNESS IN MOUTH AND THROAT WITH OR WITHOUT PRESENCE OF ULCERS (sore throat, sores in mouth, or a toothache) UNUSUAL RASH, SWELLING OR PAIN  UNUSUAL VAGINAL DISCHARGE OR ITCHING   Items with * indicate a potential emergency and should be followed up as soon as possible or go to the Emergency Department if any problems should occur.  Please show the CHEMOTHERAPY ALERT CARD or IMMUNOTHERAPY ALERT CARD at check-in to  the Emergency Department and triage nurse.  Should you have questions after your visit or need to cancel or reschedule your appointment, please contact [facility] CANCER [place] AT [place]  [phone] and follow the prompts.  Office hours are 8:00 a.m. to 4:30 p.m. Monday - Friday. Please note that voicemails left after 4:00 p.m. may not be returned until the following business day.  We are closed weekends and major holidays. You have access to a nurse at all times for urgent questions. Please call the main number to the clinic [phone] and follow the prompts.  For any non-urgent questions, you may also contact your provider using MyChart. We now offer e-Visits for anyone 54 and older to request care online for non-urgent symptoms. For details visit [email or web address].   Also download the MyChart app! Go to the app store, search "MyChart", open the app, select [place], and log in with your MyChart username and password.  Due to Covid, a mask is required upon entering the hospital/clinic. If you do not have a mask, one will be given to you upon arrival. For doctor visits, patients may have 1 support person aged 57 or older with them. For treatment visits, patients cannot have anyone with them due to current Covid guidelines and our immunocompromised population.

## 2022-01-06 NOTE — Progress Notes (Signed)
[facility] NOTE  Patient Care Team: [name], MD as PCP - General (Internal Medicine) [name], RN as Registered Nurse  CHIEF COMPLAINTS/PURPOSE OF CONSULTATION: Breast cancer    Oncology History Overview Note  # April 2022-stage Ia mammary carcinoma ER/PR positive HER2 positive [TRIPLE positive]; negative margins s/p simple mastectomy with plan for immediate reconstruction [Dr.Rodenberg/[name]]; NO RT  # May 2nd, 2022- Taxol-Herceptin  # OCT 3rd, 2022- Anastrazole 39m/day [AUG 2022- BMD- WNL. ]  # MUGA scan- 64% [03/06/2021]-   # LMP- mid 2020; Uterine ablation- [phone]; # HTN; Asthma- well controlled [on allergy shots];.Marland Kitchen   Malignant neoplasm of lower-outer quadrant of left breast of female, estrogen receptor positive ([place]  12/25/2020 Initial Diagnosis   Malignant neoplasm of lower-outer quadrant of left breast of female, estrogen receptor positive ([place]   02/26/2021 Cancer Staging   Staging form: Breast, AJCC 8th Edition - Pathologic: Stage IA (pT1c, pN0, cM0, G3, ER+, PR+, HER2+) - Signed by BCammie Sickle MD on 02/26/2021 Mitotic count score: Score 3 Histologic grading system: 3 grade system    03/10/2021 -  Chemotherapy   Patient is on Treatment Plan : BREAST Paclitaxel + Trastuzumab q7d / Trastuzumab q21d      Genetic Testing   Negative genetic testing. No pathogenic variants identified on the [facility] Multi-Cancer+RNA panel. 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 The report date is 04/29/2021.  The Multi-Cancer Panel + RNA offered by [facility] includes sequencing and/or deletion duplication testing of the following 84 genes: AIP, ALK, APC, ATM, AXIN2,BAP1,  BARD1, BLM, BMPR1A, BRCA1, BRCA2, BRIP1, CASR, CDC73, CDH1, CDK4, CDKN1B, CDKN1C, CDKN2A (p14ARF), CDKN2A (p16INK4a), CEBPA, CHEK2, CTNNA1, DICER1, DIS3L2, EGFR (c.2369C>T, p.Thr790Met variant only), EPCAM (Deletion/duplication testing only), FH, FLCN, GATA2, GPC3, GREM1  (Promoter region deletion/duplication testing only), HOXB13 (c.251G>A, p.Gly84Glu), HRAS, KIT, MAX, MEN1, MET, MITF (c.952G>A, p.Glu318Lys variant only), MLH1, MSH2, MSH3, MSH6, MUTYH, NBN, NF1, NF2, NTHL1, PALB2, PDGFRA, PHOX2B, PMS2, POLD1, POLE, POT1, PRKAR1A, PTCH1, PTEN, RAD50, RAD51C, RAD51D, RB1, RECQL4, RET, RUNX1, SDHAF2, SDHA (sequence changes only), SDHB, SDHC, SDHD, SMAD4, SMARCA4, SMARCB1, SMARCE1, STK11, SUFU, TERC, TERT, TMEM127, TP53, TSC1, TSC2, VHL, WRN and WT1.      HISTORY OF PRESENTING ILLNESS: Alone.  Ambulating independently.  Judy [id number].[id number].  female newly diagnosed breast cancer stage I ER/PR positive HER2/neu positive on adjuvant Herceptin; anastrazole is here for follow-up.  Patient reports bone aches since starting Anastrozole.  However she is able to manage with stretching.  Denies any difficulty with ADLs.  Denies any hot flashes.  She continues to be on anastrozole.   Review of Systems  Constitutional:  Negative for chills, diaphoresis, fever, malaise/fatigue and weight loss.  HENT:  Negative for nosebleeds and sore throat.   Eyes:  Negative for double vision.  Respiratory:  Negative for cough, hemoptysis, sputum production, shortness of breath and wheezing.   Cardiovascular:  Negative for chest pain, palpitations, orthopnea and leg swelling.  Gastrointestinal:  Negative for abdominal pain, blood in stool, constipation, diarrhea, heartburn, melena, nausea and vomiting.  Genitourinary:  Negative for dysuria, frequency and urgency.  Musculoskeletal:  Negative for back pain and joint pain.  Skin: Negative.  Negative for itching and rash.  Neurological:  Negative for dizziness, tingling, focal weakness, weakness and headaches.  Endo/Heme/Allergies:  Does not bruise/bleed easily.  Psychiatric/Behavioral:  Negative for depression. The patient is not nervous/anxious and does not have insomnia.     MEDICAL HISTORY:  Past Medical History:  Diagnosis Date    Asthma    well controlled   Breast cancer ([place])    Family history of adverse reaction to anesthesia    sister-hard time waking up   Family history of breast cancer    Family history of prostate cancer    Family history of uterine cancer    GERD (gastroesophageal reflux disease)    Hypertension     SURGICAL HISTORY: Past Surgical History:  Procedure Laterality Date   ABLATION     BREAST BIOPSY Left 2011   Benign per pt   BREAST BIOPSY Left 12/12/2020   3:30 3 cmfn, Q marker, path pending   BREAST BIOPSY Left 12/12/2020   3:30 1 cmfn, Vision marker, path pending   BREAST RECONSTRUCTION WITH PLACEMENT OF TISSUE EXPANDER AND FLEX HD (ACELLULAR HYDRATED DERMIS) Left 02/10/2021   Procedure: IMMEDIATE LEFT BREAST RECONSTRUCTION WITH PLACEMENT OF TISSUE EXPANDER AND FLEX HD (ACELLULAR HYDRATED DERMIS);  Surgeon: Wallace Going, DO;  Location: [facility] ORS;  Service: Plastics;  Laterality: Left;   COLONOSCOPY  01/15/2020   PORTACATH PLACEMENT Right 02/10/2021   Procedure: INSERTION PORT-A-CATH;  Surgeon: Ronny Bacon, MD;  Location: [facility] ORS;  Service: General;  Laterality: Right;   REMOVAL OF TISSUE EXPANDER AND PLACEMENT OF IMPLANT Left 07/09/2021   Procedure: REMOVAL OF TISSUE EXPANDER AND PLACEMENT OF IMPLANT LEFT BREAST;  Surgeon: Wallace Going, DO;  Location: [place];  Service: Plastics;  Laterality: Left;   SIMPLE MASTECTOMY WITH AXILLARY SENTINEL NODE BIOPSY Left 02/10/2021   Procedure: SIMPLE MASTECTOMY WITH AXILLARY SENTINEL NODE BIOPSY;  Surgeon: Ronny Bacon, MD;  Location: [facility] ORS;  Service: General;  Laterality: Left;    SOCIAL HISTORY: Social History   Socioeconomic History   Marital status: Married    Spouse name: Not on file   Number of children: Not on file   Years of education: Not on file   Highest education level: Not on file  Occupational History   Not on file  Tobacco Use   Smoking status: Never   Smokeless tobacco: Never   Vaping Use   Vaping Use: Never used  Substance and Sexual Activity   Alcohol use: Not Currently   Drug use: Never   Sexual activity: Not on file  Other Topics Concern   Not on file  Social History Narrative   Lives in [place] with husband; kids- college. Works for [employer]- working from home. No smoking or alcohol.    Social Determinants of Health   Financial Resource Strain: Not on file  Food Insecurity: Not on file  Transportation Needs: Not on file  Physical Activity: Not on file  Stress: Not on file  Social Connections: Not on file  Intimate Partner Violence: Not on file    FAMILY HISTORY: Family History  Problem Relation Age of Onset   Hypertension Mother    Diabetes Mother    Hypertension Father    Diabetes Father    Cancer Father        prostate cancer-70s   Cancer Maternal Grandmother        uterine cancer   Breast cancer Sister        in in 95s.     ALLERGIES:  is allergic to shrimp extract allergy skin test and other.  MEDICATIONS:  Current Outpatient Medications  Medication Sig Dispense Refill   albuterol (VENTOLIN HFA) 108 (90 Base) MCG/ACT inhaler Inhale 1 puff into the lungs every 6 (six) hours as needed for shortness of  breath.     amLODipine (NORVASC) 10 MG tablet Take 10 mg by mouth every morning.     anastrozole (ARIMIDEX) 1 MG tablet TAKE 1 TABLET BY MOUTH EVERY DAY 90 tablet 1   budesonide (PULMICORT) 0.5 MG/2ML nebulizer solution Take 2 mLs by nebulization daily.     cetirizine (ZYRTEC) 10 MG tablet Take 10 mg by mouth daily as needed for allergies.     Cholecalciferol 1.25 MG (50000 UT) capsule Take 50,000 Units by mouth once a week.     EPINEPHrine 0.3 mg/0.3 mL IJ SOAJ injection Inject 0.3 mg into the muscle as needed for anaphylaxis.     fluticasone (FLONASE) 50 MCG/ACT nasal spray Place 1 spray into both nostrils daily as needed for allergies.     fluticasone furoate-vilanterol (BREO ELLIPTA) 100-25 MCG/INH AEPB  Inhale 1 puff into the lungs as needed for shortness of breath.     GNP BUDESONIDE NASAL SPRAY NA Place 1 spray into the nose daily.     lidocaine-prilocaine (EMLA) cream Apply 30 -45 mins prior to port access. 50 g 0   Multiple Vitamins-Minerals (CENTRUM ADULTS) TABS Take 1 tablet by mouth daily.     NON FORMULARY Takes weekly allergy shots     pantoprazole (PROTONIX) 40 MG tablet Take 40 mg by mouth every morning.     rosuvastatin (CRESTOR) 40 MG tablet Take 40 mg by mouth daily.     meloxicam (MOBIC) 15 MG tablet Take 15 mg by mouth daily.     ondansetron (ZOFRAN) 4 MG tablet Take 1 tablet (4 mg total) by mouth every 8 (eight) hours as needed for nausea or vomiting. 20 tablet 0   prochlorperazine (COMPAZINE) 10 MG tablet Take 1 tablet (10 mg total) by mouth every 6 (six) hours as needed for nausea or vomiting. 40 tablet 1   No current facility-administered medications for this visit.      Marland Kitchen  PHYSICAL EXAMINATION: ECOG PERFORMANCE STATUS: 0 - Asymptomatic  Vitals:   01/06/22 1300  BP: 131/86  Pulse: 76  Temp: 98 F (36.7 C)   Filed Weights   01/06/22 1300  Weight: 185 lb 12.8 oz (84.3 kg)    Physical Exam HENT:     Head: Normocephalic and atraumatic.     Mouth/Throat:     Pharynx: No oropharyngeal exudate.  Eyes:     Pupils: Pupils are equal, round, and reactive to light.  Cardiovascular:     Rate and Rhythm: Normal rate and regular rhythm.  Pulmonary:     Effort: Pulmonary effort is normal. No respiratory distress.     Breath sounds: Normal breath sounds. No wheezing.  Abdominal:     General: Bowel sounds are normal. There is no distension.     Palpations: Abdomen is soft. There is no mass.     Tenderness: There is no abdominal tenderness. There is no guarding or rebound.  Musculoskeletal:        General: No tenderness. Normal range of motion.     Cervical back: Normal range of motion and neck supple.  Skin:    General: Skin is warm.  Neurological:     Mental  Status: She is alert and oriented to person, place, and time.  Psychiatric:        Mood and Affect: Affect normal.     LABORATORY DATA:  I have reviewed the data as listed Lab Results  Component Value Date   WBC 6.1 01/06/2022   HGB 12.5 01/06/2022   HCT  38.8 01/06/2022   MCV 81.7 01/06/2022   PLT 233 01/06/2022   Recent Labs    11/25/21 0847 12/16/21 1331 01/06/22 1249  NA 136 138 136  K 3.9 3.8 3.8  CL 106 106 105  CO2 '23 24 26  ' GLUCOSE 106* 109* 97  BUN 23* 17 19  CREATININE 0.94 0.89 0.97  CALCIUM 9.1 9.0 9.2  GFRNONAA >60 >60 >60  PROT 7.0 6.4* 7.0  ALBUMIN 4.2 3.9 4.1  AST '24 23 25  ' ALT 31 32 33  ALKPHOS 69 67 75  BILITOT 0.6 0.5 0.5    RADIOGRAPHIC STUDIES: I have personally reviewed the radiological images as listed and agreed with the findings in the report. No results found.  ASSESSMENT & PLAN:   Malignant neoplasm of lower-outer quadrant of left breast of female, estrogen receptor positive ([place]) # Left breast- CA- s/p mastectomy-  pT1c pN0 [Stage IA]Grade 3.  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  On adjuvant Herceptin. Started anastrozole [Oct 9471]; STABLE.   #Proceed with  Adjuvant/maintenace herceptin q3 W. Labs today reviewed;  acceptable for treatment today.  Will discontinue Benadryl.  AUG 2022- LVEF- 56.8 %.Comparable to LVEF equals 63.8% on 03/05/2021; DEC 2022- Left ventricular ejection fraction, by estimation, is 55 to 60%. The left ventricle has normal function. STABLE; will repeat again inApril 2023.  # Musculoskeletal symptoms- G-1-2 sec to Anastrozole; continue ca+vit D; Osteobiflex; if worse   # IV access: # port/IV access- Stable; discussed re: pro and cons of keeping the port vs. Explantation- will inform Dr.Rodenberg   In 6 weeks- last Rx # DISPOSITION: # herceptin today  # refer to Dr.Rodenberg re: port explantation [after April 11th, 2023] # in 3 weeks- labs- cbc/cmp; vit D level- 25-OH; Herceptin # in 6weeks- MD; labs- cbc/cmp;  Herceptin; MUGA scan prior-Dr.B   All questions were answered. The patient/family knows to call the clinic with any problems, questions or concerns.    Cammie Sickle, MD 01/06/2022 1:31 PM

## 2022-01-06 NOTE — Progress Notes (Signed)
Patient reports bone aches since starting Anastrozole.

## 2022-01-08 ENCOUNTER — Ambulatory Visit (INDEPENDENT_AMBULATORY_CARE_PROVIDER_SITE_OTHER): Payer: No Typology Code available for payment source | Admitting: Surgery

## 2022-01-08 ENCOUNTER — Encounter: Payer: Self-pay | Admitting: Surgery

## 2022-01-08 ENCOUNTER — Other Ambulatory Visit: Payer: Self-pay

## 2022-01-08 VITALS — BP 125/89 | HR 79 | Temp 98.3°F | Ht 68.0 in | Wt 183.0 lb

## 2022-01-08 DIAGNOSIS — Z452 Encounter for adjustment and management of vascular access device: Secondary | ICD-10-CM

## 2022-01-08 DIAGNOSIS — J301 Allergic rhinitis due to pollen: Secondary | ICD-10-CM | POA: Diagnosis not present

## 2022-01-08 DIAGNOSIS — Z1231 Encounter for screening mammogram for malignant neoplasm of breast: Secondary | ICD-10-CM

## 2022-01-08 NOTE — Progress Notes (Signed)
Ms. Chiles presents today in preparation for her Port-A-Cath removal.  She still has 2 more doses of Herceptin prior to its anticipated removal. ? ?We discussed her plans for removal here in the office under local.  I believe she looks forward to getting her port out.  We went ahead and scheduled her next set of imaging for screening.  And we will see her back after her last dose. ? ?These notes generated with voice recognition software. I apologize for typographical errors. ? ?Ronny Bacon M.D., FACS ?01/08/2022, 9:29 AM ? ? ? ? ?

## 2022-01-08 NOTE — Patient Instructions (Addendum)
Go ahead and schedule your mammogram. [facility] [phone].  ? ? ?We will see you back in April to remove your port in the office.  ? ? ? ?Implanted Port Removal ?Implanted port removal is a procedure to remove the port and catheter that are implanted under your skin. The port is a small disc under your skin that can be punctured with a needle. It is connected to a vein in your chest or neck by a small, thin tube (catheter). The implanted port is used to give medicines for treatments, and it may also be used to take blood samples. ?Your health care provider will remove the implanted port if: ?You no longer need it for treatment. ?It is not working properly. ?The area around it gets infected. ?Tell a health care provider about: ?Any allergies you have. ?All medicines you are taking, including vitamins, herbs, eye drops, creams, and over-the-counter medicines. ?Any problems you or family members have had with anesthetic medicines. ?Any bleeding problems you have. ?Any surgeries you have had. ?Any medical conditions you have. ?Whether you are pregnant or may be pregnant. ?What are the risks? ?Generally, this is a safe procedure. However, problems may occur, including: ?Infection. ?Bleeding. ?Allergic reactions to anesthetic medicines. ?Damage to nerves or blood vessels. ?What happens before the procedure? ?Medicines ?Ask your health care provider about: ?Changing or stopping your regular medicines. This is especially important if you are taking diabetes medicines or blood thinners. ?Taking medicines such as aspirin and ibuprofen. These medicines can thin your blood. Do not take these medicines unless your health care provider tells you to take them. ?Taking over-the-counter medicines, vitamins, herbs, and supplements. ?Tests ?You will have: ?A physical exam. ?Blood tests. ?Imaging tests, including a chest X-ray. ?General instructions ?Follow instructions from your health care provider about eating or drinking  restrictions. ?Ask your health care provider: ?How your surgery site will be marked. ?What steps will be taken to help prevent infection. These steps may include: ?Removing hair at the surgery site. ?Washing skin with a germ-killing soap. ?Taking antibiotic medicine. ?If you will be going home right after the procedure, plan to have a responsible adult: ?Take you home from the hospital or clinic. You will not be allowed to drive. ?Care for you for the time you are told. ?What happens during the procedure? ?You may be given one or more of the following: ?A medicine to numb the area (local anesthetic). ?A small incision will be made at the site of your implanted port. ?The implanted port and the catheter that has been inside your vein will be gently removed. ?The port and catheter will be inspected to make sure that all the parts have been removed. Part of the catheter may be tested for bacteria. ?The incision will be closed with stitches (sutures), adhesive strips, or skin glue. ?A bandage (dressing) will be placed over the incision. The health care provider may apply gentle pressure over the dressing for about 5 minutes. ?The procedure may vary among health care providers and hospitals. ?What happens after the procedure? ?Your blood pressure, heart rate, breathing rate, and blood oxygen level will be monitored until you leave the hospital or clinic. ?You will be monitored to make sure that there is no bleeding from the site where the port was removed. ?If you were given a sedative during the procedure, it can affect you for several hours. Do not drive or operate machinery until your health care provider says that it is safe. ?Summary ?Implanted port removal  is a procedure to remove the port and catheter that are implanted under your skin. ?Before the procedure, follow your health care provider's instructions about changing or stopping your regular medicines. This is especially important if you are taking diabetes  medicines or blood thinners. ?If you will be going home right after the procedure, plan to have a responsible adult care for you for the time you are told. ?This information is not intended to replace advice given to you by your health care provider. Make sure you discuss any questions you have with your health care provider. ?Document Revised: 04/29/2021 Document Reviewed: 04/29/2021 ?Elsevier Patient Education ? [place]. ? ?

## 2022-01-15 DIAGNOSIS — J301 Allergic rhinitis due to pollen: Secondary | ICD-10-CM | POA: Diagnosis not present

## 2022-01-17 DIAGNOSIS — M25562 Pain in left knee: Secondary | ICD-10-CM | POA: Diagnosis not present

## 2022-01-17 DIAGNOSIS — M25561 Pain in right knee: Secondary | ICD-10-CM | POA: Diagnosis not present

## 2022-01-22 DIAGNOSIS — J301 Allergic rhinitis due to pollen: Secondary | ICD-10-CM | POA: Diagnosis not present

## 2022-01-23 DIAGNOSIS — E559 Vitamin D deficiency, unspecified: Secondary | ICD-10-CM | POA: Diagnosis not present

## 2022-01-23 DIAGNOSIS — I10 Essential (primary) hypertension: Secondary | ICD-10-CM | POA: Diagnosis not present

## 2022-01-23 DIAGNOSIS — R7302 Impaired glucose tolerance (oral): Secondary | ICD-10-CM | POA: Diagnosis not present

## 2022-01-23 DIAGNOSIS — E782 Mixed hyperlipidemia: Secondary | ICD-10-CM | POA: Diagnosis not present

## 2022-01-24 DIAGNOSIS — M25561 Pain in right knee: Secondary | ICD-10-CM | POA: Diagnosis not present

## 2022-01-24 DIAGNOSIS — M25562 Pain in left knee: Secondary | ICD-10-CM | POA: Diagnosis not present

## 2022-01-27 ENCOUNTER — Inpatient Hospital Stay: Payer: No Typology Code available for payment source

## 2022-01-27 ENCOUNTER — Inpatient Hospital Stay: Payer: No Typology Code available for payment source | Attending: Internal Medicine

## 2022-01-27 ENCOUNTER — Other Ambulatory Visit: Payer: Self-pay

## 2022-01-27 VITALS — BP 138/90 | HR 78 | Temp 97.7°F | Resp 16 | Wt 186.5 lb

## 2022-01-27 DIAGNOSIS — Z5112 Encounter for antineoplastic immunotherapy: Secondary | ICD-10-CM | POA: Insufficient documentation

## 2022-01-27 DIAGNOSIS — C50512 Malignant neoplasm of lower-outer quadrant of left female breast: Secondary | ICD-10-CM | POA: Diagnosis not present

## 2022-01-27 LAB — COMPREHENSIVE METABOLIC PANEL
ALT: 34 U/L (ref 0–44)
AST: 25 U/L (ref 15–41)
Albumin: 4.1 g/dL (ref 3.5–5.0)
Alkaline Phosphatase: 73 U/L (ref 38–126)
Anion gap: 8 (ref 5–15)
BUN: 19 mg/dL (ref 6–20)
CO2: 24 mmol/L (ref 22–32)
Calcium: 9.2 mg/dL (ref 8.9–10.3)
Chloride: 104 mmol/L (ref 98–111)
Creatinine, Ser: 0.9 mg/dL (ref 0.44–1.00)
GFR, Estimated: >60 mL/min (ref >60)
Glucose, Bld: 94 mg/dL (ref 70–99)
Potassium: 3.8 mmol/L (ref 3.5–5.1)
Sodium: 136 mmol/L (ref 135–145)
Total Bilirubin: 0.4 mg/dL (ref 0.3–1.2)
Total Protein: 7.1 g/dL (ref 6.5–8.1)

## 2022-01-27 LAB — CBC WITH DIFFERENTIAL/PLATELET
Abs Immature Granulocytes: 0.02 10*3/uL (ref 0.00–0.07)
Basophils Absolute: 0 10*3/uL (ref 0.0–0.1)
Basophils Relative: 0 %
Eosinophils Absolute: 0.5 10*3/uL (ref 0.0–0.5)
Eosinophils Relative: 8 %
HCT: 38.3 % (ref 36.0–46.0)
Hemoglobin: 12.3 g/dL (ref 12.0–15.0)
Immature Granulocytes: 0 %
Lymphocytes Relative: 32 %
Lymphs Abs: 1.7 10*3/uL (ref 0.7–4.0)
MCH: 26.1 pg (ref 26.0–34.0)
MCHC: 32.1 g/dL (ref 30.0–36.0)
MCV: 81.3 fL (ref 80.0–100.0)
Monocytes Absolute: 0.6 10*3/uL (ref 0.1–1.0)
Monocytes Relative: 11 %
Neutro Abs: 2.6 10*3/uL (ref 1.7–7.7)
Neutrophils Relative %: 49 %
Platelets: 252 10*3/uL (ref 150–400)
RBC: 4.71 MIL/uL (ref 3.87–5.11)
RDW: 15.3 % (ref 11.5–15.5)
WBC: 5.3 10*3/uL (ref 4.0–10.5)
nRBC: 0 % (ref 0.0–0.2)

## 2022-01-27 LAB — VITAMIN D 25 HYDROXY (VIT D DEFICIENCY, FRACTURES): Vit D, 25-Hydroxy: 86.68 ng/mL (ref 30–100)

## 2022-01-27 MED ORDER — TRASTUZUMAB-ANNS CHEMO 150 MG IV SOLR
6.0000 mg/kg | Freq: Once | INTRAVENOUS | Status: AC
  Administered 2022-01-27: 525 mg via INTRAVENOUS
  Filled 2022-01-27: qty 20

## 2022-01-27 MED ORDER — ACETAMINOPHEN 325 MG PO TABS
650.0000 mg | ORAL_TABLET | Freq: Once | ORAL | Status: AC
  Administered 2022-01-27: 650 mg via ORAL
  Filled 2022-01-27: qty 2

## 2022-01-27 MED ORDER — HEPARIN SOD (PORK) LOCK FLUSH 100 UNIT/ML IV SOLN
500.0000 [IU] | Freq: Once | INTRAVENOUS | Status: DC | PRN
  Filled 2022-01-27: qty 5

## 2022-01-27 MED ORDER — SODIUM CHLORIDE 0.9% FLUSH
10.0000 mL | Freq: Once | INTRAVENOUS | Status: AC
  Administered 2022-01-27: 10 mL via INTRAVENOUS
  Filled 2022-01-27: qty 10

## 2022-01-27 MED ORDER — SODIUM CHLORIDE 0.9 % IV SOLN
Freq: Once | INTRAVENOUS | Status: AC
  Filled 2022-01-27: qty 250

## 2022-01-27 MED ORDER — HEPARIN SOD (PORK) LOCK FLUSH 100 UNIT/ML IV SOLN
500.0000 [IU] | Freq: Once | INTRAVENOUS | Status: AC
  Administered 2022-01-27: 500 [IU] via INTRAVENOUS
  Filled 2022-01-27: qty 5

## 2022-01-28 DIAGNOSIS — M25562 Pain in left knee: Secondary | ICD-10-CM | POA: Diagnosis not present

## 2022-01-28 DIAGNOSIS — M25561 Pain in right knee: Secondary | ICD-10-CM | POA: Diagnosis not present

## 2022-01-29 DIAGNOSIS — J301 Allergic rhinitis due to pollen: Secondary | ICD-10-CM | POA: Diagnosis not present

## 2022-01-30 ENCOUNTER — Other Ambulatory Visit: Payer: Self-pay

## 2022-01-30 ENCOUNTER — Ambulatory Visit (INDEPENDENT_AMBULATORY_CARE_PROVIDER_SITE_OTHER): Payer: No Typology Code available for payment source | Admitting: Plastic Surgery

## 2022-01-30 ENCOUNTER — Encounter: Payer: Self-pay | Admitting: Plastic Surgery

## 2022-01-30 DIAGNOSIS — Z170 Estrogen receptor positive status [ER+]: Secondary | ICD-10-CM | POA: Diagnosis not present

## 2022-01-30 DIAGNOSIS — Z9012 Acquired absence of left breast and nipple: Secondary | ICD-10-CM

## 2022-01-30 DIAGNOSIS — C50512 Malignant neoplasm of lower-outer quadrant of left female breast: Secondary | ICD-10-CM

## 2022-01-30 NOTE — Progress Notes (Signed)
? ?  Subjective:  ? ? Patient ID: Judy Padilla, female    DOB: 02-Jan-1966, 56 y.o.   MRN: [id number] ? ?The patient is a 56 year old female here for 25-monthfollow-up on her left breast reconstruction.  She had left breast cancer and underwent a mastectomy with implant placement.  Her implant is a smooth round ultrahigh profile 430 cc implant she is very pleased with her overall appearance and is not interested in tattooing at the moment.  She is also not interested in a right breast mastopexy.  She is finishing up her chemo in the next few weeks.  No lumps or bumps or concerning areas noted on either side.  She is due for a right sided mammogram and knows to call for that to be scheduled. ? ? ? ?Review of Systems  ?Constitutional: Negative.   ?Eyes: Negative.   ?Respiratory: Negative.    ?Cardiovascular: Negative.   ?Gastrointestinal: Negative.   ?Endocrine: Negative.   ?Genitourinary: Negative.   ?Musculoskeletal: Negative.   ? ?   ?Objective:  ? Physical Exam ?Constitutional:   ?   Appearance: Normal appearance.  ?Cardiovascular:  ?   Rate and Rhythm: Normal rate.  ?   Pulses: Normal pulses.  ?Pulmonary:  ?   Effort: Pulmonary effort is normal. No respiratory distress.  ?Abdominal:  ?   Palpations: Abdomen is soft.  ?Musculoskeletal:     ?   General: No swelling or deformity.  ?Skin: ?   General: Skin is warm.  ?   Capillary Refill: Capillary refill takes less than 2 seconds.  ?   Coloration: Skin is not jaundiced.  ?   Findings: No bruising or lesion.  ?Neurological:  ?   Mental Status: She is alert and oriented to person, place, and time.  ?Psychiatric:     ?   Mood and Affect: Mood normal.     ?   Behavior: Behavior normal.     ?   Thought Content: Thought content normal.     ?   Judgment: Judgment normal.  ? ? ?   ?Assessment & Plan:  ? ?  ICD-10-CM   ?1. Acquired absence of left breast  Z90.12   ?  ?2. Malignant neoplasm of lower-outer quadrant of left breast of female, estrogen receptor positive ([facility]   C50.512   ? Z17.0   ?  ?  ?Pictures were obtained of the patient and placed in the chart with the patient's or guardian's permission. ?Plan to see the patient back in 6 months.  She knows to call with any questions or concerns.  We talked about the possibility of an ultrasound of the implant and to 3 years. ? ?

## 2022-01-31 DIAGNOSIS — M25561 Pain in right knee: Secondary | ICD-10-CM | POA: Diagnosis not present

## 2022-01-31 DIAGNOSIS — M25562 Pain in left knee: Secondary | ICD-10-CM | POA: Diagnosis not present

## 2022-02-03 ENCOUNTER — Other Ambulatory Visit: Payer: Self-pay

## 2022-02-03 ENCOUNTER — Ambulatory Visit
Admission: RE | Admit: 2022-02-03 | Discharge: 2022-02-03 | Disposition: A | Discharge status: Home / Self Care | Payer: No Typology Code available for payment source | Source: Ambulatory Visit | Attending: Internal Medicine | Admitting: Internal Medicine

## 2022-02-03 DIAGNOSIS — Z9221 Personal history of antineoplastic chemotherapy: Secondary | ICD-10-CM | POA: Diagnosis not present

## 2022-02-03 DIAGNOSIS — C50919 Malignant neoplasm of unspecified site of unspecified female breast: Secondary | ICD-10-CM | POA: Diagnosis not present

## 2022-02-03 MED ORDER — TECHNETIUM TC 99M-LABELED RED BLOOD CELLS IV KIT
20.0000 | PACK | Freq: Once | INTRAVENOUS | Status: AC | PRN
  Administered 2022-02-03: 22.65 via INTRAVENOUS

## 2022-02-04 DIAGNOSIS — J301 Allergic rhinitis due to pollen: Secondary | ICD-10-CM | POA: Diagnosis not present

## 2022-02-04 DIAGNOSIS — M25562 Pain in left knee: Secondary | ICD-10-CM | POA: Diagnosis not present

## 2022-02-04 DIAGNOSIS — M25561 Pain in right knee: Secondary | ICD-10-CM | POA: Diagnosis not present

## 2022-02-05 DIAGNOSIS — J301 Allergic rhinitis due to pollen: Secondary | ICD-10-CM | POA: Diagnosis not present

## 2022-02-12 DIAGNOSIS — M47812 Spondylosis without myelopathy or radiculopathy, cervical region: Secondary | ICD-10-CM | POA: Diagnosis not present

## 2022-02-12 DIAGNOSIS — M542 Cervicalgia: Secondary | ICD-10-CM | POA: Diagnosis not present

## 2022-02-12 DIAGNOSIS — M25511 Pain in right shoulder: Secondary | ICD-10-CM | POA: Diagnosis not present

## 2022-02-12 DIAGNOSIS — J301 Allergic rhinitis due to pollen: Secondary | ICD-10-CM | POA: Diagnosis not present

## 2022-02-12 DIAGNOSIS — S161XXA Strain of muscle, fascia and tendon at neck level, initial encounter: Secondary | ICD-10-CM | POA: Diagnosis not present

## 2022-02-17 ENCOUNTER — Inpatient Hospital Stay (HOSPITAL_BASED_OUTPATIENT_CLINIC_OR_DEPARTMENT_OTHER): Payer: No Typology Code available for payment source | Admitting: Internal Medicine

## 2022-02-17 ENCOUNTER — Encounter: Payer: Self-pay | Admitting: Internal Medicine

## 2022-02-17 ENCOUNTER — Telehealth: Payer: Self-pay

## 2022-02-17 ENCOUNTER — Inpatient Hospital Stay: Payer: No Typology Code available for payment source | Attending: Internal Medicine

## 2022-02-17 ENCOUNTER — Inpatient Hospital Stay: Payer: No Typology Code available for payment source

## 2022-02-17 VITALS — BP 121/93 | HR 76 | Temp 96.9°F | Ht 68.0 in | Wt 185.2 lb

## 2022-02-17 DIAGNOSIS — T451X5A Adverse effect of antineoplastic and immunosuppressive drugs, initial encounter: Secondary | ICD-10-CM | POA: Diagnosis not present

## 2022-02-17 DIAGNOSIS — C50512 Malignant neoplasm of lower-outer quadrant of left female breast: Secondary | ICD-10-CM | POA: Diagnosis not present

## 2022-02-17 DIAGNOSIS — Z5112 Encounter for antineoplastic immunotherapy: Secondary | ICD-10-CM | POA: Insufficient documentation

## 2022-02-17 DIAGNOSIS — Z170 Estrogen receptor positive status [ER+]: Secondary | ICD-10-CM | POA: Insufficient documentation

## 2022-02-17 DIAGNOSIS — I10 Essential (primary) hypertension: Secondary | ICD-10-CM | POA: Diagnosis not present

## 2022-02-17 DIAGNOSIS — J45909 Unspecified asthma, uncomplicated: Secondary | ICD-10-CM | POA: Insufficient documentation

## 2022-02-17 DIAGNOSIS — Z95828 Presence of other vascular implants and grafts: Secondary | ICD-10-CM

## 2022-02-17 DIAGNOSIS — I427 Cardiomyopathy due to drug and external agent: Secondary | ICD-10-CM

## 2022-02-17 LAB — CBC WITH DIFFERENTIAL/PLATELET
Abs Immature Granulocytes: 0.02 10*3/uL (ref 0.00–0.07)
Basophils Absolute: 0 10*3/uL (ref 0.0–0.1)
Basophils Relative: 1 %
Eosinophils Absolute: 0.5 10*3/uL (ref 0.0–0.5)
Eosinophils Relative: 10 %
HCT: 39 % (ref 36.0–46.0)
Hemoglobin: 12.7 g/dL (ref 12.0–15.0)
Immature Granulocytes: 0 %
Lymphocytes Relative: 32 %
Lymphs Abs: 1.7 10*3/uL (ref 0.7–4.0)
MCH: 26.5 pg (ref 26.0–34.0)
MCHC: 32.6 g/dL (ref 30.0–36.0)
MCV: 81.4 fL (ref 80.0–100.0)
Monocytes Absolute: 0.6 10*3/uL (ref 0.1–1.0)
Monocytes Relative: 11 %
Neutro Abs: 2.5 10*3/uL (ref 1.7–7.7)
Neutrophils Relative %: 46 %
Platelets: 240 10*3/uL (ref 150–400)
RBC: 4.79 MIL/uL (ref 3.87–5.11)
RDW: 14.9 % (ref 11.5–15.5)
WBC: 5.4 10*3/uL (ref 4.0–10.5)
nRBC: 0 % (ref 0.0–0.2)

## 2022-02-17 LAB — COMPREHENSIVE METABOLIC PANEL
ALT: 38 U/L (ref 0–44)
AST: 27 U/L (ref 15–41)
Albumin: 4.2 g/dL (ref 3.5–5.0)
Alkaline Phosphatase: 71 U/L (ref 38–126)
Anion gap: 7 (ref 5–15)
BUN: 20 mg/dL (ref 6–20)
CO2: 24 mmol/L (ref 22–32)
Calcium: 9 mg/dL (ref 8.9–10.3)
Chloride: 106 mmol/L (ref 98–111)
Creatinine, Ser: 0.78 mg/dL (ref 0.44–1.00)
GFR, Estimated: >60 mL/min (ref >60)
Glucose, Bld: 107 mg/dL — ABNORMAL HIGH (ref 70–99)
Potassium: 3.6 mmol/L (ref 3.5–5.1)
Sodium: 137 mmol/L (ref 135–145)
Total Bilirubin: 0.7 mg/dL (ref 0.3–1.2)
Total Protein: 6.9 g/dL (ref 6.5–8.1)

## 2022-02-17 MED ORDER — SODIUM CHLORIDE 0.9% FLUSH
10.0000 mL | INTRAVENOUS | Status: DC | PRN
  Administered 2022-02-17: 10 mL via INTRAVENOUS
  Filled 2022-02-17: qty 10

## 2022-02-17 MED ORDER — ACETAMINOPHEN 325 MG PO TABS
650.0000 mg | ORAL_TABLET | Freq: Once | ORAL | Status: AC
  Administered 2022-02-17: 650 mg via ORAL
  Filled 2022-02-17: qty 2

## 2022-02-17 MED ORDER — HEPARIN SOD (PORK) LOCK FLUSH 100 UNIT/ML IV SOLN
INTRAVENOUS | Status: AC
  Filled 2022-02-17: qty 5

## 2022-02-17 MED ORDER — SODIUM CHLORIDE 0.9 % IV SOLN
Freq: Once | INTRAVENOUS | Status: AC
  Filled 2022-02-17: qty 250

## 2022-02-17 MED ORDER — TRASTUZUMAB-ANNS CHEMO 150 MG IV SOLR
6.0000 mg/kg | Freq: Once | INTRAVENOUS | Status: AC
  Administered 2022-02-17: 525 mg via INTRAVENOUS
  Filled 2022-02-17: qty 25

## 2022-02-17 MED ORDER — HEPARIN SOD (PORK) LOCK FLUSH 100 UNIT/ML IV SOLN
500.0000 [IU] | Freq: Once | INTRAVENOUS | Status: DC
  Administered 2022-02-17: 500 [IU] via INTRAVENOUS
  Filled 2022-02-17: qty 5

## 2022-02-17 NOTE — Progress Notes (Signed)
Having pain from back of rt shoulder into her arm. Ortho needs clearance from you to give treatment for it. ?

## 2022-02-17 NOTE — Patient Instructions (Signed)
[facility] AT [place]  Discharge Instructions: ?Thank you for choosing [facility] to provide your oncology and hematology care.  ?If you have a lab appointment with the [facility], please go directly to the [facility] and check in at the registration area. ? ?Wear comfortable clothing and clothing appropriate for easy access to any Portacath or PICC line.  ? ?We strive to give you quality time with your provider. You may need to reschedule your appointment if you arrive late (15 or more minutes).  Arriving late affects you and other patients whose appointments are after yours.  Also, if you miss three or more appointments without notifying the office, you may be dismissed from the clinic at the provider?s discretion.    ?  ?For prescription refill requests, have your pharmacy contact our office and allow 72 hours for refills to be completed.   ? ?Today you received the following chemotherapy and/or immunotherapy agents: trastuzumab-anns ?  ?To help prevent nausea and vomiting after your treatment, we encourage you to take your nausea medication as directed. ? ?BELOW ARE SYMPTOMS THAT SHOULD BE REPORTED IMMEDIATELY: ?*FEVER GREATER THAN 100.4 F (38 ?C) OR HIGHER ?*CHILLS OR SWEATING ?*NAUSEA AND VOMITING THAT IS NOT CONTROLLED WITH YOUR NAUSEA MEDICATION ?*UNUSUAL SHORTNESS OF BREATH ?*UNUSUAL BRUISING OR BLEEDING ?*URINARY PROBLEMS (pain or burning when urinating, or frequent urination) ?*BOWEL PROBLEMS (unusual diarrhea, constipation, pain near the anus) ?TENDERNESS IN MOUTH AND THROAT WITH OR WITHOUT PRESENCE OF ULCERS (sore throat, sores in mouth, or a toothache) ?UNUSUAL RASH, SWELLING OR PAIN  ?UNUSUAL VAGINAL DISCHARGE OR ITCHING  ? ?Items with * indicate a potential emergency and should be followed up as soon as possible or go to the Emergency Department if any problems should occur. ? ?Please show the CHEMOTHERAPY ALERT CARD or IMMUNOTHERAPY ALERT CARD at check-in  to the Emergency Department and triage nurse. ? ?Should you have questions after your visit or need to cancel or reschedule your appointment, please contact [facility] [place] [facility] [place]  [phone] and follow the prompts.  Office hours are 8:00 a.m. to 4:30 p.m. Monday - Friday. Please note that voicemails left after 4:00 p.m. may not be returned until the following business day.  We are closed weekends and major holidays. You have access to a nurse at all times for urgent questions. Please call the main number to the clinic [phone] and follow the prompts. ? ?For any non-urgent questions, you may also contact your provider using MyChart. We now offer e-Visits for anyone 30 and older to request care online for non-urgent symptoms. For details visit [email or web address]. ?  ?Also download the MyChart app! Go to the app store, search "MyChart", open the app, select [facility], and log in with your MyChart username and password. ? ?Due to Covid, a mask is required upon entering the hospital/clinic. If you do not have a mask, one will be given to you upon arrival. For doctor visits, patients may have 1 support person aged 58 or older with them. For treatment visits, patients cannot have anyone with them due to current Covid guidelines and our immunocompromised population.  ?

## 2022-02-17 NOTE — Telephone Encounter (Signed)
Per Dr. Jacinto Reap: Please type the letter/fax to [facility]: Judy Padilla [dob: December 29, 1965] finished her chemotherapy treatments on 02/17/2022. Patient is cleared from hematology/oncology standpoint to proceed with any treatment/injections as recommended for her right shoulder pain.  Please feel free to contact us if any questions or concerns.  Thank you. ? ?Letter printed. Awaiting Dr. Sharmaine Base signature. ?

## 2022-02-17 NOTE — Assessment & Plan Note (Addendum)
#  Left breast- CA- s/p mastectomy-  pT1c pN0 [Stage IA]Grade 3.  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  On adjuvant Herceptin. Started anastrozole [Oct 8301]; STABLE.  I again reviewed the recent data 10 years from the APT trial that showed more than 90% survival on current therapy. ? ?#Proceed with  Adjuvant/maintenace herceptin q3 W-LAST treatment today. Labs today reviewed;  acceptable for treatment today.   AUG 2022- LVEF- 56.8 %.Comparable to LVEF equals 63.8% on 03/05/2021;APRIL 2023- Slight decrease in LEFT ventricular ejection fraction since the prior study of 06/16/2021, decreasing from 56.8% to 51.3%.  Will repeat in MUGA scan in 3 months. ? ?# Musculoskeletal symptoms- G-1-2 sec to Anastrozole; continue ca+vit D; STABLE  ? ?# IV access: # port/IV access- Stable; discussed re: pro and cons of keeping the port vs. Explantation- appt with Dr.Rodenberg this week.  ? ?# Right shoudler pain- likley MSK radiating to R UE- s/p evaluation with [facility]- ok with steroids injection. Will fax clearance.  Clinically not suggestive of malignancy related pain. ? ?# DISPOSITION: ?# Diagnostic Mammogram Right Unilateral in April 2023.  ?# Herceptin today  ?# in 3 months- MD;no labs;MUGA scan prior-Dr.B ?

## 2022-02-17 NOTE — Progress Notes (Signed)
[facility] ?CONSULT NOTE ? ?Patient Care Team: ?[name], MD as PCP - General (Internal Medicine) ?[name], RN as Registered Nurse ? ?CHIEF COMPLAINTS/PURPOSE OF CONSULTATION: Breast cancer ? ? ? ?Oncology History Overview Note  ?# April 2022-stage Ia mammary carcinoma ER/PR positive HER2 positive [TRIPLE positive]; negative margins s/p simple mastectomy with plan for immediate reconstruction [Dr.Rodenberg/[name]]; NO RT ? ?# May 2nd, 2022- Taxol-Herceptin ? ?# OCT 3rd, 2022- Anastrazole 31m/day [AUG 2022- BMD- WNL. ] ? ?# MUGA scan- 64% [03/06/2021]-  ? ?# LMP- mid 2020; Uterine ablation- [phone]; # HTN; Asthma- well controlled [on allergy shots];.  ?  ?Malignant neoplasm of lower-outer quadrant of left breast of female, estrogen receptor positive ([facility]  ?12/25/2020 Initial Diagnosis  ? Malignant neoplasm of lower-outer quadrant of left breast of female, estrogen receptor positive ([facility] ?  ?02/26/2021 Cancer Staging  ? Staging form: Breast, AJCC 8th Edition ?- Pathologic: Stage IA (pT1c, pN0, cM0, G3, ER+, PR+, HER2+) - Signed by BCammie Sickle MD on 02/26/2021 ?Mitotic count score: Score 3 ?Histologic grading system: 3 grade system ? ?  ?03/10/2021 -  Chemotherapy  ? Patient is on Treatment Plan : BREAST Paclitaxel + Trastuzumab q7d / Trastuzumab q21d  ?   ? Genetic Testing  ? Negative genetic testing. No pathogenic variants identified on the [facility] Multi-Cancer+RNA panel. 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 The report date is 04/29/2021. ? ?The Multi-Cancer Panel + RNA offered by [facility] includes sequencing and/or deletion duplication testing of the following 84 genes: AIP, ALK, APC, ATM, AXIN2,BAP1,  BARD1, BLM, BMPR1A, BRCA1, BRCA2, BRIP1, CASR, CDC73, CDH1, CDK4, CDKN1B, CDKN1C, CDKN2A (p14ARF), CDKN2A (p16INK4a), CEBPA, CHEK2, CTNNA1, DICER1, DIS3L2, EGFR (c.2369C>T, p.Thr790Met variant only), EPCAM (Deletion/duplication testing only), FH, FLCN, GATA2, GPC3, GREM1  (Promoter region deletion/duplication testing only), HOXB13 (c.251G>A, p.Gly84Glu), HRAS, KIT, MAX, MEN1, MET, MITF (c.952G>A, p.Glu318Lys variant only), MLH1, MSH2, MSH3, MSH6, MUTYH, NBN, NF1, NF2, NTHL1, PALB2, PDGFRA, PHOX2B, PMS2, POLD1, POLE, POT1, PRKAR1A, PTCH1, PTEN, RAD50, RAD51C, RAD51D, RB1, RECQL4, RET, RUNX1, SDHAF2, SDHA (sequence changes only), SDHB, SDHC, SDHD, SMAD4, SMARCA4, SMARCB1, SMARCE1, STK11, SUFU, TERC, TERT, TMEM127, TP53, TSC1, TSC2, VHL, WRN and WT1. ?  ? ? ? ?HISTORY OF PRESENTING ILLNESS: Alone.  Ambulating independently. ? ?LLenetta Quaker56y.o.  female newly diagnosed breast cancer stage I ER/PR positive HER2/neu positive on adjuvant Herceptin; anastrazole is here for follow-up/review results of the  MUGA scan ? ?[facility] right [facility].  Also trigger finger.  No swelling in the legs. ? ?Denies any hot flashes.  She continues to be on anastrozole. ? ?Review of Systems  ?Constitutional:  Negative for chills, diaphoresis, fever, malaise/fatigue and weight loss.  ?HENT:  Negative for nosebleeds and sore throat.   ?Eyes:  Negative for double vision.  ?Respiratory:  Negative for cough, hemoptysis, sputum production, shortness of breath and wheezing.   ?Cardiovascular:  Negative for chest pain, palpitations, orthopnea and leg swelling.  ?Gastrointestinal:  Negative for abdominal pain, blood in stool, constipation, diarrhea, heartburn, melena, nausea and vomiting.  ?Genitourinary:  Negative for dysuria, frequency and urgency.  ?Musculoskeletal:  Negative for back pain and joint pain.  ?Skin: Negative.  Negative for itching and rash.  ?Neurological:  Negative for dizziness, tingling, focal weakness, weakness and headaches.  ?Endo/Heme/Allergies:  Does not bruise/bleed easily.  ?Psychiatric/Behavioral:  Negative for depression. The patient is not nervous/anxious and does not have insomnia.    ? ?  MEDICAL HISTORY:  ?Past  Medical History:  ?Diagnosis Date  ? Asthma   ? well controlled  ? Breast cancer ([place])   ? Family history of adverse reaction to anesthesia   ? sister-hard time waking up  ? Family history of breast cancer   ? Family history of prostate cancer   ? Family history of uterine cancer   ? GERD (gastroesophageal reflux disease)   ? Hypertension   ? ? ?SURGICAL HISTORY: ?Past Surgical History:  ?Procedure Laterality Date  ? ABLATION    ? BREAST BIOPSY Left 2011  ? Benign per pt  ? BREAST BIOPSY Left 12/12/2020  ? 3:30 3 cmfn, Q marker, path pending  ? BREAST BIOPSY Left 12/12/2020  ? 3:30 1 cmfn, Vision marker, path pending  ? BREAST RECONSTRUCTION WITH PLACEMENT OF TISSUE EXPANDER AND FLEX HD (ACELLULAR HYDRATED DERMIS) Left 02/10/2021  ? Procedure: IMMEDIATE LEFT BREAST RECONSTRUCTION WITH PLACEMENT OF TISSUE EXPANDER AND FLEX HD (ACELLULAR HYDRATED DERMIS);  Surgeon: Wallace Going, DO;  Location: [facility] OR[facility];  Service: Plastics;  Laterality: Left;  ? COLONOSCOPY  01/15/2020  ? PORTACATH PLACEMENT Right 02/10/2021  ? Procedure: INSERTION PORT-A-CATH;  Surgeon: Ronny Bacon, MD;  Location: [facility] OR[facility];  Service: General;  Laterality: Right;  ? REMOVAL OF TISSUE EXPANDER AND PLACEMENT OF IMPLANT Left 07/09/2021  ? Procedure: REMOVAL OF TISSUE EXPANDER AND PLACEMENT OF IMPLANT LEFT BREAST;  Surgeon: Wallace Going, DO;  Location: [facility];  Service: Plastics;  Laterality: Left;  ? SIMPLE MASTECTOMY WITH AXILLARY SENTINEL NODE BIOPSY Left 02/10/2021  ? Procedure: SIMPLE MASTECTOMY WITH AXILLARY SENTINEL NODE BIOPSY;  Surgeon: Ronny Bacon, MD;  Location: [facility];  Service: General;  Laterality: Left;  ? ? ?SOCIAL HISTORY: ?Social History  ? ?Socioeconomic History  ? Marital status: Married  ?  Spouse name: Not on file  ? Number of children: Not on file  ? Years of education: Not on file  ? Highest education level: Not on file  ?Occupational History  ? Not on file  ?Tobacco Use  ? Smoking status:  Never  ? Smokeless tobacco: Never  ?Vaping Use  ? Vaping Use: Never used  ?Substance and Sexual Activity  ? Alcohol use: Not Currently  ? Drug use: Never  ? Sexual activity: Not on file  ?Other Topics Concern  ? Not on file  ?Social History Narrative  ? Lives in [place] with husband; kids- college. Works for [employer]- working from home. No smoking or alcohol.   ? ?Social Determinants of Health  ? ?Financial Resource Strain: Not on file  ?Food Insecurity: Not on file  ?Transportation Needs: Not on file  ?Physical Activity: Not on file  ?Stress: Not on file  ?Social Connections: Not on file  ?Intimate Partner Violence: Not on file  ? ? ?FAMILY HISTORY: ?Family History  ?Problem Relation Age of Onset  ? Hypertension Mother   ? Diabetes Mother   ? Hypertension Father   ? Diabetes Father   ? Cancer Father   ?     prostate cancer-70s  ? Cancer Maternal Grandmother   ?     uterine cancer  ? Breast cancer Sister   ?     in in 76s.   ? ? ?ALLERGIES:  is allergic to shrimp extract allergy skin test and other. ? ?MEDICATIONS:  ?Current Outpatient Medications  ?Medication Sig Dispense Refill  ? albuterol (VENTOLIN HFA) 108 (90 Base) MCG/ACT inhaler Inhale 1 puff into the lungs every 6 (  six) hours as needed for shortness of breath.    ? amLODipine (NORVASC) 10 MG tablet Take 10 mg by mouth every morning.    ? anastrozole (ARIMIDEX) 1 MG tablet TAKE 1 TABLET BY MOUTH EVERY DAY 90 tablet 1  ? budesonide (PULMICORT) 0.5 MG/2ML nebulizer solution Take 2 mLs by nebulization daily.    ? cetirizine (ZYRTEC) 10 MG tablet Take 10 mg by mouth daily as needed for allergies.    ? Cholecalciferol 1.25 MG (50000 UT) capsule Take 50,000 Units by mouth once a week.    ? EPINEPHrine 0.3 mg/0.3 mL IJ SOAJ injection Inject 0.3 mg into the muscle as needed for anaphylaxis.    ? fluticasone (FLONASE) 50 MCG/ACT nasal spray Place 1 spray into both nostrils daily as needed for allergies.    ? fluticasone furoate-vilanterol (BREO  ELLIPTA) 100-25 MCG/INH AEPB Inhale 1 puff into the lungs as needed for shortness of breath.    ? GNP BUDESONIDE NASAL SPRAY NA Place 1 spray into the nose daily.    ? lidocaine-prilocaine (EMLA) cream Appl

## 2022-02-17 NOTE — Progress Notes (Signed)
Patient tolerated KANJINTI infusion well today, no concerns voiced. Patient stable at discharge. AVS given.   ?

## 2022-02-19 ENCOUNTER — Ambulatory Visit (INDEPENDENT_AMBULATORY_CARE_PROVIDER_SITE_OTHER): Payer: No Typology Code available for payment source | Admitting: Surgery

## 2022-02-19 ENCOUNTER — Encounter: Payer: Self-pay | Admitting: Internal Medicine

## 2022-02-19 ENCOUNTER — Encounter: Payer: Self-pay | Admitting: Surgery

## 2022-02-19 VITALS — BP 154/85 | HR 78 | Temp 97.9°F | Ht 68.0 in | Wt 182.6 lb

## 2022-02-19 DIAGNOSIS — Z95828 Presence of other vascular implants and grafts: Secondary | ICD-10-CM

## 2022-02-19 DIAGNOSIS — Z452 Encounter for adjustment and management of vascular access device: Secondary | ICD-10-CM

## 2022-02-19 DIAGNOSIS — J301 Allergic rhinitis due to pollen: Secondary | ICD-10-CM | POA: Diagnosis not present

## 2022-02-19 NOTE — Patient Instructions (Addendum)
If you have any concerns or questions, please feel free to call our office. Follow up as needed.  ? ?Implanted Port Removal, Care After ?The following information offers guidance on how to care for yourself after your procedure. Your health care provider may also give you more specific instructions. If you have problems or questions, contact your health care provider. ?What can I expect after the procedure? ?After the procedure, it is common to have: ?Soreness or pain near your incision. ?Some swelling or bruising near your incision. ?Follow these instructions at home: ?Medicines ?Take over-the-counter and prescription medicines only as told by your health care provider. ?If you were prescribed an antibiotic medicine, take it as told by your health care provider. Do not stop taking the antibiotic even if you start to feel better. ?Bathing ?Do not take baths, swim, or use a hot tub until your health care provider approves. ?Ask your health care provider if you can take showers. You may only be allowed to take sponge baths. ?Incision care ? ?Follow instructions from your health care provider about how to take care of your incision. Make sure you: ?Wash your hands with soap and water for at least 20 seconds before and after you change your bandage (dressing). If soap and water are not available, use hand sanitizer. ?Change your dressing as told by your health care provider. ?Keep your dressing dry. ?Leave stitches (sutures), skin glue, or adhesive strips in place. These skin closures may need to stay in place for 2 weeks or longer. If adhesive strip edges start to loosen and curl up, you may trim the loose edges. Do not remove adhesive strips completely unless your health care provider tells you to do that. ?Check your incision area every day for signs of infection. Check for: ?More redness, swelling, or pain. ?More fluid or blood. ?Warmth. ?Pus or a bad smell. ?Activity ?Return to your normal activities as told by  your health care provider. Ask your health care provider what activities are safe for you. ?You may have to avoid lifting. Ask your health care provider how much you can safely lift. ?Do not do activities that involve lifting your arms over your head. ?Driving ? ?If you were given a sedative during the procedure, it can affect you for several hours. Do not drive or operate machinery until your health care provider says that it is safe. ?If you did not receive a sedative, ask your health care provider when it is safe to drive. ?General instructions ?Do not use any products that contain nicotine or tobacco. These products include cigarettes, chewing tobacco, and vaping devices, such as e-cigarettes. These can delay healing after surgery. If you need help quitting, ask your health care provider. ?Keep all follow-up visits. This is important. ?Contact a health care provider if: ?You have a fever or chills. ?You have more redness, swelling, or pain around your incision. ?You have more fluid or blood coming from your incision. ?Your incision feels warm to the touch. ?You have pus or a bad smell coming from your incision. ?You have pain that is not relieved by your pain medicine. ?Get help right away if: ?You have chest pain. ?You have difficulty breathing. ?These symptoms may be an emergency. Get help right away. Call 911. ?Do not wait to see if the symptoms will go away. ?Do not drive yourself to the hospital. ?Summary ?After the procedure, it is common to have pain, soreness, swelling, or bruising near your incision. ?If you were prescribed an  antibiotic medicine, take it as told by your health care provider. Do not stop taking the antibiotic even if you start to feel better. ?If you were given a sedative during the procedure, it can affect you for several hours. Do not drive or operate machinery until your health care provider says that it is safe. ?Return to your normal activities as told by your health care provider.  Ask your health care provider what activities are safe for you. ?This information is not intended to replace advice given to you by your health care provider. Make sure you discuss any questions you have with your health care provider. ?Document Revised: 04/29/2021 Document Reviewed: 04/29/2021 ?Elsevier Patient Education ? [place]. ? ?

## 2022-02-19 NOTE — Telephone Encounter (Signed)
Judy Padilla signed letter, pt notified. I placed it at front desk registration for pick up. ?

## 2022-02-19 NOTE — Progress Notes (Signed)
SURGICAL OPERATIVE REPORT ? ?DATE OF PROCEDURE: 02/19/2022 ? ?ANESTHESIA: Local ? ?PRE-OPERATIVE DIAGNOSIS: Tunneled right subclavian central venous catheter with subcutaneous port, no longer needed (icd-10: O70.962) ? ?POST-OPERATIVE DIAGNOSIS:  Same ? ?PROCEDURE(S):  ?1.) Removal of indwelling right subclavian central venous catheter with subcutaneous port (cpt: 36590) ? ?INTRAOPERATIVE FINDINGS: Well-incorporated tunneled right subclavian central venous catheter with subcutaneous port without erythema or purulence ? ?INTRAVENOUS FLUIDS: 0 mL crystalloid  ? ?ESTIMATED BLOOD LOSS: Minimal (< 5 mL) ? ?SPECIMEN/EXPLANT: Port-A-Cath with reservoir and catheter intact. ? ?DRAINS: none ? ?COMPLICATIONS: None apparent ? ?CONDITION AT END OF PROCEDURE: Hemodynamically stable and awake ? ?DISPOSITION OF PATIENT: Home. ? ?INDICATIONS FOR PROCEDURE:  ?Patient is s/p complete utilization of a right subclavian central venous catheter with indwelling subcutaneous port presents for evaluation and removal of his no longer needed port. Patient reports the port was never infected and always functioned properly, but since it's reportedly no longer needed, and  wishes to have it removed. All risks, benefits, and alternatives to above procedure were discussed with the patient, all of patient's questions were answered to his expressed satisfaction, and informed consent was obtained and documented. ? ?DETAILS OF PROCEDURE: ?Patient was brought to the procedure suite and appropriately identified. In supine position, operative site was prepped and draped in the usual sterile fashion, and following a brief time out, lidocaine was injected in the subcutaneous tissue overlying the port, a 2 cm incision was made through the previous scar to accommodate the port. A combination of blunt dissection, sharp dissection  were used to free the port and attached catheter from its surrounding capsule and tissues. The port was then able to be removed  from its subcutaneous pocket, the catheter was confirmed to slide freely, and the catheter was removed with immediate focal pressure applied to the venous insertion site for hemostasis. Hemostasis was then confirmed, the subcutaneous pocket was evaluated to ensure all/any previous used sutures were removed, and the wound was then closed with a running subcuticular 4-0 Monocryl suture was used to re-approximate epidermis. Skin was then cleaned, dried, and sterile Dermabond was applied and allowed to dry. ? ?Ronny Bacon, M.D., FACS ?[facility] ? ?02/19/2022 ; 8:52 AM  ?

## 2022-02-21 DIAGNOSIS — M25562 Pain in left knee: Secondary | ICD-10-CM | POA: Diagnosis not present

## 2022-02-21 DIAGNOSIS — M25561 Pain in right knee: Secondary | ICD-10-CM | POA: Diagnosis not present

## 2022-02-25 DIAGNOSIS — M65341 Trigger finger, right ring finger: Secondary | ICD-10-CM | POA: Diagnosis not present

## 2022-02-25 DIAGNOSIS — M170 Bilateral primary osteoarthritis of knee: Secondary | ICD-10-CM | POA: Diagnosis not present

## 2022-02-26 DIAGNOSIS — J301 Allergic rhinitis due to pollen: Secondary | ICD-10-CM | POA: Diagnosis not present

## 2022-02-28 DIAGNOSIS — M25562 Pain in left knee: Secondary | ICD-10-CM | POA: Diagnosis not present

## 2022-02-28 DIAGNOSIS — M25561 Pain in right knee: Secondary | ICD-10-CM | POA: Diagnosis not present

## 2022-03-12 DIAGNOSIS — J301 Allergic rhinitis due to pollen: Secondary | ICD-10-CM | POA: Diagnosis not present

## 2022-03-19 DIAGNOSIS — J301 Allergic rhinitis due to pollen: Secondary | ICD-10-CM | POA: Diagnosis not present

## 2022-03-24 ENCOUNTER — Ambulatory Visit
Admission: RE | Admit: 2022-03-24 | Discharge: 2022-03-24 | Disposition: A | Discharge status: Home / Self Care | Payer: No Typology Code available for payment source | Source: Ambulatory Visit | Attending: Surgery | Admitting: Surgery

## 2022-03-24 DIAGNOSIS — Z1231 Encounter for screening mammogram for malignant neoplasm of breast: Secondary | ICD-10-CM | POA: Insufficient documentation

## 2022-03-24 HISTORY — DX: Personal history of antineoplastic chemotherapy: Z92.21

## 2022-03-25 ENCOUNTER — Other Ambulatory Visit: Payer: Self-pay | Admitting: Surgery

## 2022-03-25 DIAGNOSIS — R928 Other abnormal and inconclusive findings on diagnostic imaging of breast: Secondary | ICD-10-CM

## 2022-03-25 DIAGNOSIS — N63 Unspecified lump in unspecified breast: Secondary | ICD-10-CM

## 2022-03-26 DIAGNOSIS — M25562 Pain in left knee: Secondary | ICD-10-CM | POA: Diagnosis not present

## 2022-03-26 DIAGNOSIS — J301 Allergic rhinitis due to pollen: Secondary | ICD-10-CM | POA: Diagnosis not present

## 2022-03-26 DIAGNOSIS — M25561 Pain in right knee: Secondary | ICD-10-CM | POA: Diagnosis not present

## 2022-03-28 DIAGNOSIS — M25561 Pain in right knee: Secondary | ICD-10-CM | POA: Diagnosis not present

## 2022-03-28 DIAGNOSIS — M25562 Pain in left knee: Secondary | ICD-10-CM | POA: Diagnosis not present

## 2022-03-30 DIAGNOSIS — M65331 Trigger finger, right middle finger: Secondary | ICD-10-CM | POA: Diagnosis not present

## 2022-04-01 ENCOUNTER — Ambulatory Visit
Admission: RE | Admit: 2022-04-01 | Discharge: 2022-04-01 | Disposition: A | Discharge status: Home / Self Care | Payer: No Typology Code available for payment source | Source: Ambulatory Visit | Attending: Surgery | Admitting: Surgery

## 2022-04-01 DIAGNOSIS — N63 Unspecified lump in unspecified breast: Secondary | ICD-10-CM | POA: Diagnosis not present

## 2022-04-01 DIAGNOSIS — R922 Inconclusive mammogram: Secondary | ICD-10-CM | POA: Diagnosis not present

## 2022-04-01 DIAGNOSIS — R928 Other abnormal and inconclusive findings on diagnostic imaging of breast: Secondary | ICD-10-CM | POA: Diagnosis not present

## 2022-04-01 DIAGNOSIS — N6310 Unspecified lump in the right breast, unspecified quadrant: Secondary | ICD-10-CM | POA: Diagnosis not present

## 2022-04-02 DIAGNOSIS — J301 Allergic rhinitis due to pollen: Secondary | ICD-10-CM | POA: Diagnosis not present

## 2022-04-09 DIAGNOSIS — J329 Chronic sinusitis, unspecified: Secondary | ICD-10-CM | POA: Diagnosis not present

## 2022-04-16 DIAGNOSIS — J301 Allergic rhinitis due to pollen: Secondary | ICD-10-CM | POA: Diagnosis not present

## 2022-04-23 DIAGNOSIS — J302 Other seasonal allergic rhinitis: Secondary | ICD-10-CM | POA: Diagnosis not present

## 2022-04-23 DIAGNOSIS — J301 Allergic rhinitis due to pollen: Secondary | ICD-10-CM | POA: Diagnosis not present

## 2022-04-23 DIAGNOSIS — J329 Chronic sinusitis, unspecified: Secondary | ICD-10-CM | POA: Diagnosis not present

## 2022-04-29 DIAGNOSIS — J301 Allergic rhinitis due to pollen: Secondary | ICD-10-CM | POA: Diagnosis not present

## 2022-04-30 DIAGNOSIS — J301 Allergic rhinitis due to pollen: Secondary | ICD-10-CM | POA: Diagnosis not present

## 2022-05-06 ENCOUNTER — Ambulatory Visit
Admission: RE | Admit: 2022-05-06 | Discharge: 2022-05-06 | Disposition: A | Discharge status: Home / Self Care | Payer: No Typology Code available for payment source | Source: Ambulatory Visit | Attending: Internal Medicine | Admitting: Internal Medicine

## 2022-05-06 DIAGNOSIS — Z170 Estrogen receptor positive status [ER+]: Secondary | ICD-10-CM | POA: Insufficient documentation

## 2022-05-06 DIAGNOSIS — C50912 Malignant neoplasm of unspecified site of left female breast: Secondary | ICD-10-CM | POA: Diagnosis not present

## 2022-05-06 DIAGNOSIS — C50512 Malignant neoplasm of lower-outer quadrant of left female breast: Secondary | ICD-10-CM | POA: Diagnosis not present

## 2022-05-06 DIAGNOSIS — T451X5A Adverse effect of antineoplastic and immunosuppressive drugs, initial encounter: Secondary | ICD-10-CM | POA: Diagnosis not present

## 2022-05-06 DIAGNOSIS — I427 Cardiomyopathy due to drug and external agent: Secondary | ICD-10-CM | POA: Diagnosis not present

## 2022-05-06 MED ORDER — TECHNETIUM TC 99M-LABELED RED BLOOD CELLS IV KIT
20.0000 | PACK | Freq: Once | INTRAVENOUS | Status: AC | PRN
  Administered 2022-05-06: 20.36 via INTRAVENOUS

## 2022-05-07 DIAGNOSIS — J301 Allergic rhinitis due to pollen: Secondary | ICD-10-CM | POA: Diagnosis not present

## 2022-05-10 ENCOUNTER — Other Ambulatory Visit: Payer: Self-pay | Admitting: Internal Medicine

## 2022-05-14 DIAGNOSIS — J301 Allergic rhinitis due to pollen: Secondary | ICD-10-CM | POA: Diagnosis not present

## 2022-05-19 ENCOUNTER — Inpatient Hospital Stay: Payer: No Typology Code available for payment source | Attending: Internal Medicine | Admitting: Internal Medicine

## 2022-05-19 ENCOUNTER — Encounter: Payer: Self-pay | Admitting: Internal Medicine

## 2022-05-19 ENCOUNTER — Other Ambulatory Visit: Payer: Self-pay | Admitting: Internal Medicine

## 2022-05-19 DIAGNOSIS — J4541 Moderate persistent asthma with (acute) exacerbation: Secondary | ICD-10-CM | POA: Diagnosis not present

## 2022-05-19 DIAGNOSIS — E782 Mixed hyperlipidemia: Secondary | ICD-10-CM | POA: Diagnosis not present

## 2022-05-19 DIAGNOSIS — Z79811 Long term (current) use of aromatase inhibitors: Secondary | ICD-10-CM | POA: Diagnosis not present

## 2022-05-19 DIAGNOSIS — I10 Essential (primary) hypertension: Secondary | ICD-10-CM | POA: Diagnosis not present

## 2022-05-19 DIAGNOSIS — K219 Gastro-esophageal reflux disease without esophagitis: Secondary | ICD-10-CM | POA: Insufficient documentation

## 2022-05-19 DIAGNOSIS — C50512 Malignant neoplasm of lower-outer quadrant of left female breast: Secondary | ICD-10-CM

## 2022-05-19 DIAGNOSIS — R109 Unspecified abdominal pain: Secondary | ICD-10-CM | POA: Diagnosis not present

## 2022-05-19 DIAGNOSIS — Z170 Estrogen receptor positive status [ER+]: Secondary | ICD-10-CM

## 2022-05-19 DIAGNOSIS — N390 Urinary tract infection, site not specified: Secondary | ICD-10-CM | POA: Diagnosis not present

## 2022-05-19 NOTE — Patient Instructions (Signed)
#   HOLD off anastrazole until next visit  # Take protonix 1 pill twice a day- on empty stomach twice aday

## 2022-05-19 NOTE — Progress Notes (Signed)
Survivorship Care Plan visit completed.  Treatment summary reviewed and given to patient.  ASCO answers booklet reviewed and given to patient.  CARE program and Cancer Transitions discussed with patient along with other resources cancer center offers to patients and caregivers.  Patient verbalized understanding.    

## 2022-05-19 NOTE — Assessment & Plan Note (Addendum)
#  Left breast- CA- s/p mastectomy-  pT1c pN0 [Stage IA]Grade 3.  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  On adjuvant Herceptin. Started anastrozole [Oct 1833]; STABLE;  s/p Adjuvant herceptin [finished April 2023]. MAY 2023- Diagnostic Mammogram Right Unilateral in April 2023.  #Currently on adjuvant anastrozole.  However HOLD Anastrazole x1 months [see below]  # Right shoudler pain- likley MSK radiating to R UE- s/p evaluation with [facility] othro- ok with steroids injection.  Also okay to hold off anastrozole for 1 month.   #Drop in ejection fraction-asymptomatic.JULY 2023- LEFT ventricular ejection fraction of 50.4%, little changed from 51.3% on 02/03/2022; baseline- LVEF equals 63.8% on 03/05/2021 will repeat MUGA scan in 6 months.   # GERD/reflux- ? Asthma [recent antibiotics/ steroids] on protonix BID on empty stomach; continue TUMS prn; continue antihistamines.    # DISPOSITION: #  5-6 weeks MD;no labs- Dr.B

## 2022-05-19 NOTE — Progress Notes (Signed)
[facility] [place] NOTE  Patient Care Team: [name], MD as PCP - General (Internal Medicine) [name], RN as Registered Nurse Cammie Sickle, MD as Consulting Physician (Internal Medicine) Ronny Bacon, MD as Consulting Physician (General Surgery) [name], [name], DO as Attending Physician (Plastic Surgery)  CHIEF COMPLAINTS/PURPOSE OF CONSULTATION: Breast cancer    Oncology History Overview Note  # April 2022-stage Ia mammary carcinoma ER/PR positive HER2 positive [TRIPLE positive]; negative margins s/p simple mastectomy with plan for immediate reconstruction [Dr.Rodenberg/[name]]; NO RT  # May 2nd, 2022- Taxol-Herceptin  # OCT 3rd, 2022- Anastrazole 44m/day [AUG 2022- BMD- WNL. ]  # MUGA scan- 64% [03/06/2021]-   # LMP- mid 2020; Uterine ablation- [phone]; # HTN; Asthma- well controlled [on allergy shots];.Marland Kitchen   Malignant neoplasm of lower-outer quadrant of left breast of female, estrogen receptor positive ([facility]  12/25/2020 Initial Diagnosis   Malignant neoplasm of lower-outer quadrant of left breast of female, estrogen receptor positive ([facility]   02/26/2021 Cancer Staging   Staging form: Breast, AJCC 8th Edition - Pathologic: Stage IA (pT1c, pN0, cM0, G3, ER+, PR+, HER2+) - Signed by BCammie Sickle MD on 02/26/2021 Mitotic count score: Score 3 Histologic grading system: 3 grade system   03/10/2021 -  Chemotherapy   Patient is on Treatment Plan : BREAST Paclitaxel + Trastuzumab q7d / Trastuzumab q21d      Genetic Testing   Negative genetic testing. No pathogenic variants identified on the [facility] Multi-Cancer+RNA panel. 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 The report date is 04/29/2021.  The Multi-Cancer Panel + RNA offered by [facility] includes sequencing and/or deletion duplication testing of the following 84 genes: AIP, ALK, APC, ATM, AXIN2,BAP1,  BARD1, BLM, BMPR1A, BRCA1, BRCA2, BRIP1, CASR, CDC73, CDH1, CDK4, CDKN1B,  CDKN1C, CDKN2A (p14ARF), CDKN2A (p16INK4a), CEBPA, CHEK2, CTNNA1, DICER1, DIS3L2, EGFR (c.2369C>T, p.Thr790Met variant only), EPCAM (Deletion/duplication testing only), FH, FLCN, GATA2, GPC3, GREM1 (Promoter region deletion/duplication testing only), HOXB13 (c.251G>A, p.Gly84Glu), HRAS, KIT, MAX, MEN1, MET, MITF (c.952G>A, p.Glu318Lys variant only), MLH1, MSH2, MSH3, MSH6, MUTYH, NBN, NF1, NF2, NTHL1, PALB2, PDGFRA, PHOX2B, PMS2, POLD1, POLE, POT1, PRKAR1A, PTCH1, PTEN, RAD50, RAD51C, RAD51D, RB1, RECQL4, RET, RUNX1, SDHAF2, SDHA (sequence changes only), SDHB, SDHC, SDHD, SMAD4, SMARCA4, SMARCB1, SMARCE1, STK11, SUFU, TERC, TERT, TMEM127, TP53, TSC1, TSC2, VHL, WRN and WT1.      HISTORY OF PRESENTING ILLNESS: Alone.  Ambulating independently.  Judy Quaker89y.o.  female newly diagnosed breast cancer stage I ER/PR positive HER2/neu positive on adjuvant Herceptin; anastrazole is here for follow-up/review results of the  MUGA scan  [facility].  Also trigger finger.  No swelling in the legs.  Notes to have worsening joint pains; muscle pain.  In the interim patient was evaluated for worsening asthma-like symptoms.  Also reflux-like symptoms.  Patient states that she continues to be compliant with anastrozole.  Review of Systems  Constitutional:  Negative for chills, diaphoresis, fever, malaise/fatigue and weight loss.  HENT:  Negative for nosebleeds and sore throat.   Eyes:  Negative for double vision.  Respiratory:  Negative for cough, hemoptysis, sputum production, shortness of breath and wheezing.   Cardiovascular:  Negative for chest pain, palpitations, orthopnea and leg swelling.  Gastrointestinal:  Negative for abdominal pain, blood in stool, constipation, diarrhea, heartburn, melena, nausea and vomiting.  Genitourinary:  Negative for dysuria, frequency and urgency.  Musculoskeletal:  Negative for back  pain and joint  pain.  Skin: Negative.  Negative for itching and rash.  Neurological:  Negative for dizziness, tingling, focal weakness, weakness and headaches.  Endo/Heme/Allergies:  Does not bruise/bleed easily.  Psychiatric/Behavioral:  Negative for depression. The patient is not nervous/anxious and does not have insomnia.      MEDICAL HISTORY:  Past Medical History:  Diagnosis Date   Asthma    well controlled   Breast cancer ([place])    Family history of adverse reaction to anesthesia    sister-hard time waking up   Family history of breast cancer    Family history of prostate cancer    Family history of uterine cancer    GERD (gastroesophageal reflux disease)    Hypertension    Personal history of chemotherapy     SURGICAL HISTORY: Past Surgical History:  Procedure Laterality Date   ABLATION     BREAST BIOPSY Left 2011   Benign per pt   BREAST BIOPSY Left 12/12/2020   3:30 3 cmfn, Q marker, pos   BREAST BIOPSY Left 12/12/2020   3:30 1 cmfn, Vision marker, pos   BREAST RECONSTRUCTION WITH PLACEMENT OF TISSUE EXPANDER AND FLEX HD (ACELLULAR HYDRATED DERMIS) Left 02/10/2021   Procedure: IMMEDIATE LEFT BREAST RECONSTRUCTION WITH PLACEMENT OF TISSUE EXPANDER AND FLEX HD (ACELLULAR HYDRATED DERMIS);  Surgeon: Wallace Going, DO;  Location: [facility];  Service: Plastics;  Laterality: Left;   COLONOSCOPY  01/15/2020   MASTECTOMY Left 2022   PORTACATH PLACEMENT Right 02/10/2021   Procedure: INSERTION PORT-A-CATH;  Surgeon: Ronny Bacon, MD;  Location: [facility];  Service: General;  Laterality: Right;   REMOVAL OF TISSUE EXPANDER AND PLACEMENT OF IMPLANT Left 07/09/2021   Procedure: REMOVAL OF TISSUE EXPANDER AND PLACEMENT OF IMPLANT LEFT BREAST;  Surgeon: Wallace Going, DO;  Location: [facility];  Service: Plastics;  Laterality: Left;   SIMPLE MASTECTOMY WITH AXILLARY SENTINEL NODE BIOPSY Left 02/10/2021   Procedure: SIMPLE MASTECTOMY WITH AXILLARY  SENTINEL NODE BIOPSY;  Surgeon: Ronny Bacon, MD;  Location: [facility];  Service: General;  Laterality: Left;    SOCIAL HISTORY: Social History   Socioeconomic History   Marital status: Married    Spouse name: Not on file   Number of children: Not on file   Years of education: Not on file   Highest education level: Not on file  Occupational History   Not on file  Tobacco Use   Smoking status: Never   Smokeless tobacco: Never  Vaping Use   Vaping Use: Never used  Substance and Sexual Activity   Alcohol use: Not Currently   Drug use: Never   Sexual activity: Not on file  Other Topics Concern   Not on file  Social History Narrative   Lives in [place] with husband; kids- college. Works for [employer]- working from home. No smoking or alcohol.    Social Determinants of Health   Financial Resource Strain: Not on file  Food Insecurity: Not on file  Transportation Needs: Not on file  Physical Activity: Not on file  Stress: Not on file  Social Connections: Not on file  Intimate Partner Violence: Not on file    FAMILY HISTORY: Family History  Problem Relation Age of Onset   Hypertension Mother    Diabetes Mother    Hypertension Father    Diabetes Father    Cancer Father        prostate cancer-70s   Cancer Maternal Grandmother        uterine cancer  Breast cancer Sister        in in 67s.     ALLERGIES:  is allergic to shrimp extract allergy skin test and other.  MEDICATIONS:  Current Outpatient Medications  Medication Sig Dispense Refill   albuterol (VENTOLIN HFA) 108 (90 Base) MCG/ACT inhaler Inhale 1 puff into the lungs every 6 (six) hours as needed for shortness of breath.     amLODipine (NORVASC) 10 MG tablet Take 10 mg by mouth every morning.     budesonide (PULMICORT) 0.5 MG/2ML nebulizer solution Take 2 mLs by nebulization daily.     cetirizine (ZYRTEC) 10 MG tablet Take 10 mg by mouth daily as needed for allergies.      Cholecalciferol 1.25 MG (50000 UT) capsule Take 50,000 Units by mouth once a week.     fluticasone (FLONASE) 50 MCG/ACT nasal spray Place 1 spray into both nostrils daily as needed for allergies.     fluticasone furoate-vilanterol (BREO ELLIPTA) 100-25 MCG/INH AEPB Inhale 1 puff into the lungs as needed for shortness of breath.     GNP BUDESONIDE NASAL SPRAY NA Place 1 spray into the nose daily.     Multiple Vitamins-Minerals (CENTRUM ADULTS) TABS Take 1 tablet by mouth daily.     NON FORMULARY Takes weekly allergy shots     pantoprazole (PROTONIX) 40 MG tablet Take 40 mg by mouth every morning.     rosuvastatin (CRESTOR) 40 MG tablet Take 40 mg by mouth daily.     anastrozole (ARIMIDEX) 1 MG tablet TAKE 1 TABLET BY MOUTH EVERY DAY 90 tablet 1   EPINEPHrine 0.3 mg/0.3 mL IJ SOAJ injection Inject 0.3 mg into the muscle as needed for anaphylaxis. (Patient not taking: Reported on 05/19/2022)     No current facility-administered medications for this visit.      Marland Kitchen  PHYSICAL EXAMINATION: ECOG PERFORMANCE STATUS: 0 - Asymptomatic  Vitals:   05/19/22 1019  BP: 123/90  Pulse: (!) 108  Resp: 18  Temp: 97.7 F (36.5 C)  SpO2: 98%   Filed Weights   05/19/22 1019  Weight: 181 lb 12.8 oz (82.5 kg)    Physical Exam HENT:     Head: Normocephalic and atraumatic.     Mouth/Throat:     Pharynx: No oropharyngeal exudate.  Eyes:     Pupils: Pupils are equal, round, and reactive to light.  Cardiovascular:     Rate and Rhythm: Normal rate and regular rhythm.  Pulmonary:     Effort: Pulmonary effort is normal. No respiratory distress.     Breath sounds: Normal breath sounds. No wheezing.  Abdominal:     General: Bowel sounds are normal. There is no distension.     Palpations: Abdomen is soft. There is no mass.     Tenderness: There is no abdominal tenderness. There is no guarding or rebound.  Musculoskeletal:        General: No tenderness. Normal range of motion.     Cervical back: Normal  range of motion and neck supple.  Skin:    General: Skin is warm.  Neurological:     Mental Status: She is alert and oriented to person, place, and time.  Psychiatric:        Mood and Affect: Affect normal.      LABORATORY DATA:  I have reviewed the data as listed Lab Results  Component Value Date   WBC 5.4 02/17/2022   HGB 12.7 02/17/2022   HCT 39.0 02/17/2022   MCV 81.4 02/17/2022  PLT 240 02/17/2022   Recent Labs    01/06/22 1249 01/27/22 1323 02/17/22 0900  NA 136 136 137  K 3.8 3.8 3.6  CL 105 104 106  CO2 '26 24 24  ' GLUCOSE 97 94 107*  BUN '19 19 20  ' CREATININE 0.97 0.90 0.78  CALCIUM 9.2 9.2 9.0  GFRNONAA >60 >60 >60  PROT 7.0 7.1 6.9  ALBUMIN 4.1 4.1 4.2  AST '25 25 27  ' ALT 33 34 38  ALKPHOS 75 73 71  BILITOT 0.5 0.4 0.7    RADIOGRAPHIC STUDIES: I have personally reviewed the radiological images as listed and agreed with the findings in the report. US Abdomen Complete  Result Date: 05/23/2022 CLINICAL DATA:  Generalized abdominal pain EXAM: ABDOMEN ULTRASOUND COMPLETE COMPARISON:  None Available. FINDINGS: Gallbladder: There may be mild sludge in the gallbladder. There is a 3.9 mm polyp. The gallbladder is otherwise normal in appearance. No Murphy's sign. Common bile duct: Diameter: 3.2 mm Liver: No focal lesion identified. Within normal limits in parenchymal echogenicity. Portal vein is patent on color Doppler imaging with normal direction of blood flow towards the liver. IVC: No abnormality visualized. Pancreas: Visualized portion unremarkable. Spleen: Size and appearance within normal limits. Right Kidney: Length: 10.9 cm. Echogenicity within normal limits. No mass or hydronephrosis visualized. Left Kidney: Length: 10.7 cm. Echogenicity within normal limits. No mass or hydronephrosis visualized. Abdominal aorta: No aneurysm visualized. Other findings: An 8.6 cm uterine fibroid is incidentally identified and incompletely assessed. IMPRESSION: 1. There may be a  small amount of sludge in the gallbladder. There is a 3.9 mm polyp. No follow-up imaging recommended for the polyp. No other abnormalities. 2. An 8.6 cm uterine fibroid is incidentally identified and incompletely assessed. Electronically Signed   By: Dorise Bullion III M.D.   On: 05/23/2022 17:45   NM Cardiac M[facility] Rest  Result Date: 05/06/2022 CLINICAL DATA:  LEFT breast cancer, cardiotoxic therapy EXAM: NUCLEAR MEDICINE CARDIAC BLOOD POOL IMAGING (MUGA) TECHNIQUE: Cardiac multi-gated acquisition was performed at rest following intravenous injection of Tc-35mlabeled red blood cells. RADIOPHARMACEUTICALS:  20.36 mCi Tc-970mertechnetate in-vitro labeled red blood cells IV COMPARISON:  02/03/2022. FINDINGS: Calculated LEFT ventricular ejection fraction is 50.4%, low normal. Study was obtained at a cardiac rate of 69 bpm. Patient was rhythmic during imaging. Cine analysis of the LEFT ventricle in 3 projections demonstrates normal LV wall motion. IMPRESSION: LEFT ventricular ejection fraction of 50.4%, little changed from 51.3% on 02/03/2022. Electronically Signed   By: MaLavonia Dana.D.   On: 05/06/2022 11:44    ASSESSMENT & PLAN:   Malignant neoplasm of lower-outer quadrant of left breast of female, estrogen receptor positive ([facility]# Left breast- CA- s/p mastectomy-  pT1c pN0 [Stage IA]Grade 3.  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  On adjuvant Herceptin. Started anastrozole [Oct 201275]STABLE;  s/p Adjuvant herceptin [finished April 2023]. MAY 2023- Diagnostic Mammogram Right Unilateral in April 2023.  #Currently on adjuvant anastrozole.  However HOLD Anastrazole x1 months [see below]  # Right shoudler pain- likley MSK radiating to R UE- s/p evaluation with [facility] othro- ok with steroids injection.  Also okay to hold off anastrozole for 1 month.   #Drop in ejection fraction-asymptomatic.JULY 2023- LEFT ventricular ejection fraction of 50.4%, little changed from 51.3% on 02/03/2022; baseline- LVEF  equals 63.8% on 03/05/2021 will repeat MUGA scan in 6 months.   # GERD/reflux- ? Asthma [recent antibiotics/ steroids] on protonix BID on empty stomach; continue TUMS prn; continue antihistamines.  # DISPOSITION: #  5-6 weeks MD;no labs- Dr.B   All questions were answered. The patient/family knows to call the clinic with any problems, questions or concerns.    Cammie Sickle, MD 05/27/2022 11:24 PM

## 2022-05-19 NOTE — Progress Notes (Signed)
Pt in for follow up reports having generalized pain all over and lots of joint pain.  Pt reports she has been having issues with asthma and is scheduled to see primary care provider this morning.

## 2022-05-21 DIAGNOSIS — J301 Allergic rhinitis due to pollen: Secondary | ICD-10-CM | POA: Diagnosis not present

## 2022-05-22 ENCOUNTER — Ambulatory Visit
Admission: RE | Admit: 2022-05-22 | Discharge: 2022-05-22 | Disposition: A | Discharge status: Home / Self Care | Payer: No Typology Code available for payment source | Source: Ambulatory Visit | Attending: Internal Medicine | Admitting: Internal Medicine

## 2022-05-22 DIAGNOSIS — R109 Unspecified abdominal pain: Secondary | ICD-10-CM | POA: Insufficient documentation

## 2022-05-22 DIAGNOSIS — K824 Cholesterolosis of gallbladder: Secondary | ICD-10-CM | POA: Diagnosis not present

## 2022-05-25 ENCOUNTER — Other Ambulatory Visit: Payer: Self-pay | Admitting: Internal Medicine

## 2022-05-25 DIAGNOSIS — R109 Unspecified abdominal pain: Secondary | ICD-10-CM

## 2022-05-27 ENCOUNTER — Encounter: Payer: Self-pay | Admitting: Internal Medicine

## 2022-05-28 DIAGNOSIS — J301 Allergic rhinitis due to pollen: Secondary | ICD-10-CM | POA: Diagnosis not present

## 2022-06-01 ENCOUNTER — Other Ambulatory Visit: Payer: Self-pay

## 2022-06-02 ENCOUNTER — Other Ambulatory Visit: Payer: Self-pay

## 2022-06-09 ENCOUNTER — Other Ambulatory Visit: Payer: Self-pay | Admitting: Internal Medicine

## 2022-06-09 ENCOUNTER — Ambulatory Visit
Admission: RE | Admit: 2022-06-09 | Discharge: 2022-06-09 | Disposition: A | Discharge status: Home / Self Care | Payer: No Typology Code available for payment source | Source: Ambulatory Visit | Attending: Internal Medicine | Admitting: Internal Medicine

## 2022-06-09 DIAGNOSIS — R109 Unspecified abdominal pain: Secondary | ICD-10-CM | POA: Diagnosis not present

## 2022-06-09 DIAGNOSIS — R1310 Dysphagia, unspecified: Secondary | ICD-10-CM

## 2022-06-09 MED ORDER — TECHNETIUM TC 99M MEBROFENIN IV KIT
5.0000 | PACK | Freq: Once | INTRAVENOUS | Status: AC | PRN
  Administered 2022-06-09: 5.19 via INTRAVENOUS

## 2022-06-11 DIAGNOSIS — J301 Allergic rhinitis due to pollen: Secondary | ICD-10-CM | POA: Diagnosis not present

## 2022-06-16 ENCOUNTER — Ambulatory Visit
Admission: RE | Admit: 2022-06-16 | Discharge: 2022-06-16 | Disposition: A | Discharge status: Home / Self Care | Payer: No Typology Code available for payment source | Source: Ambulatory Visit | Attending: Internal Medicine | Admitting: Internal Medicine

## 2022-06-16 DIAGNOSIS — R1310 Dysphagia, unspecified: Secondary | ICD-10-CM | POA: Diagnosis not present

## 2022-06-16 DIAGNOSIS — K224 Dyskinesia of esophagus: Secondary | ICD-10-CM | POA: Diagnosis not present

## 2022-06-18 DIAGNOSIS — J301 Allergic rhinitis due to pollen: Secondary | ICD-10-CM | POA: Diagnosis not present

## 2022-06-19 DIAGNOSIS — E782 Mixed hyperlipidemia: Secondary | ICD-10-CM | POA: Diagnosis not present

## 2022-06-19 DIAGNOSIS — I10 Essential (primary) hypertension: Secondary | ICD-10-CM | POA: Diagnosis not present

## 2022-06-19 DIAGNOSIS — K224 Dyskinesia of esophagus: Secondary | ICD-10-CM | POA: Diagnosis not present

## 2022-06-19 DIAGNOSIS — K219 Gastro-esophageal reflux disease without esophagitis: Secondary | ICD-10-CM | POA: Diagnosis not present

## 2022-06-23 ENCOUNTER — Inpatient Hospital Stay: Payer: No Typology Code available for payment source | Attending: Internal Medicine | Admitting: Internal Medicine

## 2022-06-23 ENCOUNTER — Encounter: Payer: Self-pay | Admitting: Internal Medicine

## 2022-06-23 ENCOUNTER — Other Ambulatory Visit: Payer: Self-pay | Admitting: Internal Medicine

## 2022-06-23 DIAGNOSIS — Z79811 Long term (current) use of aromatase inhibitors: Secondary | ICD-10-CM | POA: Insufficient documentation

## 2022-06-23 DIAGNOSIS — Z170 Estrogen receptor positive status [ER+]: Secondary | ICD-10-CM | POA: Insufficient documentation

## 2022-06-23 DIAGNOSIS — C50512 Malignant neoplasm of lower-outer quadrant of left female breast: Secondary | ICD-10-CM | POA: Diagnosis not present

## 2022-06-23 DIAGNOSIS — I10 Essential (primary) hypertension: Secondary | ICD-10-CM | POA: Insufficient documentation

## 2022-06-23 DIAGNOSIS — K219 Gastro-esophageal reflux disease without esophagitis: Secondary | ICD-10-CM | POA: Diagnosis not present

## 2022-06-23 DIAGNOSIS — Z8249 Family history of ischemic heart disease and other diseases of the circulatory system: Secondary | ICD-10-CM | POA: Insufficient documentation

## 2022-06-23 DIAGNOSIS — J45909 Unspecified asthma, uncomplicated: Secondary | ICD-10-CM | POA: Diagnosis not present

## 2022-06-23 MED ORDER — EXEMESTANE 25 MG PO TABS: 25.0000 mg | ORAL_TABLET | Freq: Every day | ORAL | 3 refills | Status: DC

## 2022-06-23 NOTE — Progress Notes (Signed)
[facility] NOTE  Patient Care Team: [name], MD as PCP - General (Internal Medicine) [name], RN as Registered Nurse Cammie Sickle, MD as Consulting Physician (Internal Medicine) Ronny Bacon, MD as Consulting Physician (General Surgery) [name], [name], DO as Attending Physician (Plastic Surgery)  CHIEF COMPLAINTS/PURPOSE OF CONSULTATION: Breast cancer    Oncology History Overview Note  # April 2022-stage Ia mammary carcinoma ER/PR positive HER2 positive [TRIPLE positive]; negative margins s/p simple mastectomy with plan for immediate reconstruction [Dr.Rodenberg/[name]]; NO RT  # May 2nd, 2022- Taxol-Herceptin  # OCT 3rd, 2022- Anastrazole 32m/day [AUG 2022- BMD- WNL];   # AUG 2023- DISCONTINUE  Anastrazole- sec to MSK side effects; start Aromasin.  # MUGA scan- 64% [03/06/2021]-   # LMP- mid 2020; Uterine ablation- [phone]; # HTN; Asthma- well controlled [on allergy shots];.Marland Kitchen   Malignant neoplasm of lower-outer quadrant of left breast of female, estrogen receptor positive ([facility]  12/25/2020 Initial Diagnosis   Malignant neoplasm of lower-outer quadrant of left breast of female, estrogen receptor positive ([place]   02/26/2021 Cancer Staging   Staging form: Breast, AJCC 8th Edition - Pathologic: Stage IA (pT1c, pN0, cM0, G3, ER+, PR+, HER2+) - Signed by BCammie Sickle MD on 02/26/2021 Mitotic count score: Score 3 Histologic grading system: 3 grade system   03/10/2021 -  Chemotherapy   Patient is on Treatment Plan : BREAST Paclitaxel + Trastuzumab q7d / Trastuzumab q21d      Genetic Testing   Negative genetic testing. No pathogenic variants identified on the [facility] Multi-Cancer+RNA panel. 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 The report date is 04/29/2021.  The Multi-Cancer Panel + RNA offered by [facility] includes sequencing and/or deletion duplication testing of the following 84 genes: AIP, ALK, APC, ATM,  AXIN2,BAP1,  BARD1, BLM, BMPR1A, BRCA1, BRCA2, BRIP1, CASR, CDC73, CDH1, CDK4, CDKN1B, CDKN1C, CDKN2A (p14ARF), CDKN2A (p16INK4a), CEBPA, CHEK2, CTNNA1, DICER1, DIS3L2, EGFR (c.2369C>T, p.Thr790Met variant only), EPCAM (Deletion/duplication testing only), FH, FLCN, GATA2, GPC3, GREM1 (Promoter region deletion/duplication testing only), HOXB13 (c.251G>A, p.Gly84Glu), HRAS, KIT, MAX, MEN1, MET, MITF (c.952G>A, p.Glu318Lys variant only), MLH1, MSH2, MSH3, MSH6, MUTYH, NBN, NF1, NF2, NTHL1, PALB2, PDGFRA, PHOX2B, PMS2, POLD1, POLE, POT1, PRKAR1A, PTCH1, PTEN, RAD50, RAD51C, RAD51D, RB1, RECQL4, RET, RUNX1, SDHAF2, SDHA (sequence changes only), SDHB, SDHC, SDHD, SMAD4, SMARCA4, SMARCB1, SMARCE1, STK11, SUFU, TERC, TERT, TMEM127, TP53, TSC1, TSC2, VHL, WRN and WT1.      HISTORY OF PRESENTING ILLNESS: Alone.  Ambulating independently.  LLenetta Quaker89y.o.  female newly diagnosed breast cancer stage I ER/PR positive HER2/neu positive on adjuvant Herceptin; anastrazole is here for follow-up. However anatrazole on hold for last 4-6 weeks sec to MSK joint.  Notes her improvement of her joint pains.  Since coming off anastrozole.  Continues to have reflux-like symptoms.  Awaiting evaluation with GI.  Review of Systems  Constitutional:  Negative for chills, diaphoresis, fever, malaise/fatigue and weight loss.  HENT:  Negative for nosebleeds and sore throat.   Eyes:  Negative for double vision.  Respiratory:  Negative for cough, hemoptysis, sputum production, shortness of breath and wheezing.   Cardiovascular:  Negative for chest pain, palpitations, orthopnea and leg swelling.  Gastrointestinal:  Negative for abdominal pain, blood in stool, constipation, diarrhea, heartburn, melena, nausea and vomiting.  Genitourinary:  Negative for dysuria, frequency and urgency.  Musculoskeletal:  Negative for back pain and joint pain.  Skin: Negative.  Negative for itching and rash.  Neurological:  Negative for  dizziness,  tingling, focal weakness, weakness and headaches.  Endo/Heme/Allergies:  Does not bruise/bleed easily.  Psychiatric/Behavioral:  Negative for depression. The patient is not nervous/anxious and does not have insomnia.      MEDICAL HISTORY:  Past Medical History:  Diagnosis Date   Asthma    well controlled   Breast cancer ([place])    Family history of adverse reaction to anesthesia    sister-hard time waking up   Family history of breast cancer    Family history of prostate cancer    Family history of uterine cancer    GERD (gastroesophageal reflux disease)    Hypertension    Personal history of chemotherapy     SURGICAL HISTORY: Past Surgical History:  Procedure Laterality Date   ABLATION     BREAST BIOPSY Left 2011   Benign per pt   BREAST BIOPSY Left 12/12/2020   3:30 3 cmfn, Q marker, pos   BREAST BIOPSY Left 12/12/2020   3:30 1 cmfn, Vision marker, pos   BREAST RECONSTRUCTION WITH PLACEMENT OF TISSUE EXPANDER AND FLEX HD (ACELLULAR HYDRATED DERMIS) Left 02/10/2021   Procedure: IMMEDIATE LEFT BREAST RECONSTRUCTION WITH PLACEMENT OF TISSUE EXPANDER AND FLEX HD (ACELLULAR HYDRATED DERMIS);  Surgeon: Wallace Going, DO;  Location: [facility];  Service: Plastics;  Laterality: Left;   COLONOSCOPY  01/15/2020   MASTECTOMY Left 2022   PORTACATH PLACEMENT Right 02/10/2021   Procedure: INSERTION PORT-A-CATH;  Surgeon: Ronny Bacon, MD;  Location: [facility];  Service: General;  Laterality: Right;   REMOVAL OF TISSUE EXPANDER AND PLACEMENT OF IMPLANT Left 07/09/2021   Procedure: REMOVAL OF TISSUE EXPANDER AND PLACEMENT OF IMPLANT LEFT BREAST;  Surgeon: Wallace Going, DO;  Location: [facility];  Service: Plastics;  Laterality: Left;   SIMPLE MASTECTOMY WITH AXILLARY SENTINEL NODE BIOPSY Left 02/10/2021   Procedure: SIMPLE MASTECTOMY WITH AXILLARY SENTINEL NODE BIOPSY;  Surgeon: Ronny Bacon, MD;  Location: [facility];  Service: General;   Laterality: Left;    SOCIAL HISTORY: Social History   Socioeconomic History   Marital status: Married    Spouse name: Not on file   Number of children: Not on file   Years of education: Not on file   Highest education level: Not on file  Occupational History   Not on file  Tobacco Use   Smoking status: Never   Smokeless tobacco: Never  Vaping Use   Vaping Use: Never used  Substance and Sexual Activity   Alcohol use: Not Currently   Drug use: Never   Sexual activity: Not on file  Other Topics Concern   Not on file  Social History Narrative   Lives in [place] with husband; kids- college. Works for [employer]- working from home. No smoking or alcohol.    Social Determinants of Health   Financial Resource Strain: Not on file  Food Insecurity: Not on file  Transportation Needs: Not on file  Physical Activity: Not on file  Stress: Not on file  Social Connections: Not on file  Intimate Partner Violence: Not on file    FAMILY HISTORY: Family History  Problem Relation Age of Onset   Hypertension Mother    Diabetes Mother    Hypertension Father    Diabetes Father    Cancer Father        prostate cancer-70s   Cancer Maternal Grandmother        uterine cancer   Breast cancer Sister        in in 49s.  ALLERGIES:  is allergic to shrimp extract allergy skin test and other.  MEDICATIONS:  Current Outpatient Medications  Medication Sig Dispense Refill   albuterol (VENTOLIN HFA) 108 (90 Base) MCG/ACT inhaler Inhale 1 puff into the lungs every 6 (six) hours as needed for shortness of breath.     amLODipine (NORVASC) 10 MG tablet Take 10 mg by mouth every morning.     budesonide (PULMICORT) 0.5 MG/2ML nebulizer solution Take 2 mLs by nebulization daily.     cetirizine (ZYRTEC) 10 MG tablet Take 10 mg by mouth daily as needed for allergies.     Cholecalciferol 1.25 MG (50000 UT) capsule Take 50,000 Units by mouth once a week.     EPINEPHrine 0.3  mg/0.3 mL IJ SOAJ injection Inject 0.3 mg into the muscle as needed for anaphylaxis.     exemestane (AROMASIN) 25 MG tablet Take 1 tablet (25 mg total) by mouth daily after breakfast. 30 tablet 3   fluticasone (FLONASE) 50 MCG/ACT nasal spray Place 1 spray into both nostrils daily as needed for allergies.     fluticasone furoate-vilanterol (BREO ELLIPTA) 100-25 MCG/INH AEPB Inhale 1 puff into the lungs as needed for shortness of breath.     GNP BUDESONIDE NASAL SPRAY NA Place 1 spray into the nose daily.     NON FORMULARY Takes weekly allergy shots     pantoprazole (PROTONIX) 40 MG tablet Take 40 mg by mouth every morning.     rosuvastatin (CRESTOR) 40 MG tablet Take 40 mg by mouth daily.     Multiple Vitamins-Minerals (CENTRUM ADULTS) TABS Take 1 tablet by mouth daily. (Patient not taking: Reported on 06/23/2022)     No current facility-administered medications for this visit.      Marland Kitchen  PHYSICAL EXAMINATION: ECOG PERFORMANCE STATUS: 0 - Asymptomatic  Vitals:   06/23/22 1033  BP: (!) 135/90  Pulse: 81  Temp: (!) 96.6 F (35.9 C)  SpO2: 99%   Filed Weights   06/23/22 1033  Weight: 178 lb 6.4 oz (80.9 kg)    Physical Exam HENT:     Head: Normocephalic and atraumatic.     Mouth/Throat:     Pharynx: No oropharyngeal exudate.  Eyes:     Pupils: Pupils are equal, round, and reactive to light.  Cardiovascular:     Rate and Rhythm: Normal rate and regular rhythm.  Pulmonary:     Effort: Pulmonary effort is normal. No respiratory distress.     Breath sounds: Normal breath sounds. No wheezing.  Abdominal:     General: Bowel sounds are normal. There is no distension.     Palpations: Abdomen is soft. There is no mass.     Tenderness: There is no abdominal tenderness. There is no guarding or rebound.  Musculoskeletal:        General: No tenderness. Normal range of motion.     Cervical back: Normal range of motion and neck supple.  Skin:    General: Skin is warm.  Neurological:      Mental Status: She is alert and oriented to person, place, and time.  Psychiatric:        Mood and Affect: Affect normal.      LABORATORY DATA:  I have reviewed the data as listed Lab Results  Component Value Date   WBC 5.4 02/17/2022   HGB 12.7 02/17/2022   HCT 39.0 02/17/2022   MCV 81.4 02/17/2022   PLT 240 02/17/2022   Recent Labs    01/06/22 1249 01/27/22 1323  02/17/22 0900  NA 136 136 137  K 3.8 3.8 3.6  CL 105 104 106  CO2 '26 24 24  ' GLUCOSE 97 94 107*  BUN '19 19 20  ' CREATININE 0.97 0.90 0.78  CALCIUM 9.2 9.2 9.0  GFRNONAA >60 >60 >60  PROT 7.0 7.1 6.9  ALBUMIN 4.1 4.1 4.2  AST '25 25 27  ' ALT 33 34 38  ALKPHOS 75 73 71  BILITOT 0.5 0.4 0.7    RADIOGRAPHIC STUDIES: I have personally reviewed the radiological images as listed and agreed with the findings in the report. DG ESOPHAGUS W DOUBLE CM (HD)  Result Date: 06/16/2022 CLINICAL DATA:  Patient with complaint of food and liquid dysphagia. Request for double contrast medium esophagram. EXAM: ESOPHAGUS/BARIUM SWALLOW/TABLET STUDY TECHNIQUE: Combined double and single contrast examination was performed using effervescent crystals, high-density barium, and thin liquid barium. This exam was performed by Narda Rutherford, NP, and was supervised and interpreted by Ruthann Cancer, MD. FLUOROSCOPY: Radiation Exposure Index (as provided by the fluoroscopic device): 41.70 mGy Kerma COMPARISON:  None Available. FINDINGS: Swallowing: Appears normal. No vestibular penetration or aspiration seen. Pharynx: Unremarkable. Esophagus: Normal appearance. Esophageal motility: Distal tertiary contractions indicative of mild dysmotility noted. Hiatal Hernia: None. Gastroesophageal reflux: None visualized. Ingested 46m barium tablet: Passed normally Other: None. IMPRESSION: Mild esophageal dysmotility. Otherwise normal fluoroscopic esophagram study. Read by: SNarda Rutherford AGNP-BC Electronically Signed   By: DRuthann CancerM.D.   On: 06/16/2022 09:13    NM Hepato W/EF  Result Date: 06/09/2022 CLINICAL DATA:  Abdominal pain EXAM: NUCLEAR MEDICINE HEPATOBILIARY IMAGING WITH GALLBLADDER EF TECHNIQUE: Sequential images of the abdomen were obtained out to 60 minutes following intravenous administration of radiopharmaceutical. After oral ingestion of Ensure, gallbladder ejection fraction was determined. At 60 min, normal ejection fraction is greater than 33%. RADIOPHARMACEUTICALS:  5.19 mCi Tc-954mCholetec IV COMPARISON:  None available FINDINGS: Normal tracer extraction from bloodstream indicating normal hepatocellular function. Normal excretion of tracer into biliary tree. Gallbladder visualized at 6 min. Small bowel visualized at 46 min. No hepatic retention of tracer. Subjectively normal emptying of tracer from gallbladder following fatty meal stimulation. Calculated gallbladder ejection fraction is 74%, normal. Patient reported no symptoms following Ensure ingestion. Normal gallbladder ejection fraction following Ensure ingestion is greater than 33% at 1 hour. IMPRESSION: Normal exam. Electronically Signed   By: MaLavonia Dana.D.   On: 06/09/2022 11:39    ASSESSMENT & PLAN:   Malignant neoplasm of lower-outer quadrant of left breast of female, estrogen receptor positive ([place]# Left breast- CA- s/p mastectomy-  pT1c pN0 [Stage IA]Grade 3.  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  S/p  adjuvant Herceptin [april 2023]. Started anastrozole [Oct 204158]STABLE;  s/p Adjuvant herceptin [finished April 2023]. MAY 2023- Diagnostic Mammogram Right Unilateral in April 2023.  # DISCONTINUE  Anastrazole- sec to MSK side effects; start Aromasin. Again discussed the AI related SEs.   # Right shoudler pain- likley MSK radiating to R UE- s/p evaluation with [facility] othro- ok with steroids injection. Monitor closely on Aromasin.   #Drop in ejection fraction-asymptomatic.JULY 2023- LEFT ventricular ejection fraction of 50.4%, little changed from 51.3% on 02/03/2022;  baseline- LVEF equals 63.8% on 03/05/2021] will repeat MUGA scan in JAN 2024.   # GERD/reflux- ? Asthma [recent antibiotics/ steroids] on protonix BID on empty stomach; continue TUMS prn; continue antihistamines.  Awaiting GI evaluation.   # DISPOSITION: #  2 months- MD;no labs- Dr.B   All questions were answered. The patient/family knows to call the  clinic with any problems, questions or concerns.    Cammie Sickle, MD 06/23/2022 11:08 AM

## 2022-06-23 NOTE — Assessment & Plan Note (Addendum)
#  Left breast- CA- s/p mastectomy-  pT1c pN0 [Stage IA]Grade 3.  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  S/p  adjuvant Herceptin [april 2023]. Started anastrozole [Oct 5087]; STABLE;  s/p Adjuvant herceptin [finished April 2023]. MAY 2023- Diagnostic Mammogram Right Unilateral in April 2023.  # DISCONTINUE  Anastrazole- sec to MSK side effects; start Aromasin. Again discussed the AI related SEs.   # Right shoudler pain- likley MSK radiating to R UE- s/p evaluation with Emerge othro- ok with steroids injection. Monitor closely on Aromasin.   #Drop in ejection fraction-asymptomatic.JULY 2023- LEFT ventricular ejection fraction of 50.4%, little changed from 51.3% on 02/03/2022; baseline- LVEF equals 63.8% on 03/05/2021] will repeat MUGA scan in JAN 2024.   # GERD/reflux- ? Asthma [recent antibiotics/ steroids] on protonix BID on empty stomach; continue TUMS prn; continue antihistamines.  Awaiting GI evaluation.   # DISPOSITION: #  2 months- MD;no labs- Dr.B

## 2022-06-24 ENCOUNTER — Other Ambulatory Visit: Payer: Self-pay

## 2022-06-25 DIAGNOSIS — J301 Allergic rhinitis due to pollen: Secondary | ICD-10-CM | POA: Diagnosis not present

## 2022-07-02 DIAGNOSIS — J301 Allergic rhinitis due to pollen: Secondary | ICD-10-CM | POA: Diagnosis not present

## 2022-07-09 DIAGNOSIS — J301 Allergic rhinitis due to pollen: Secondary | ICD-10-CM | POA: Diagnosis not present

## 2022-07-12 DIAGNOSIS — Z8709 Personal history of other diseases of the respiratory system: Secondary | ICD-10-CM | POA: Diagnosis not present

## 2022-07-12 DIAGNOSIS — J40 Bronchitis, not specified as acute or chronic: Secondary | ICD-10-CM | POA: Diagnosis not present

## 2022-07-16 DIAGNOSIS — J301 Allergic rhinitis due to pollen: Secondary | ICD-10-CM | POA: Diagnosis not present

## 2022-07-23 DIAGNOSIS — J301 Allergic rhinitis due to pollen: Secondary | ICD-10-CM | POA: Diagnosis not present

## 2022-07-23 DIAGNOSIS — Z23 Encounter for immunization: Secondary | ICD-10-CM | POA: Diagnosis not present

## 2022-07-24 ENCOUNTER — Ambulatory Visit: Payer: No Typology Code available for payment source | Admitting: Plastic Surgery

## 2022-07-24 ENCOUNTER — Encounter: Payer: Self-pay | Admitting: Surgical

## 2022-07-24 ENCOUNTER — Ambulatory Visit (INDEPENDENT_AMBULATORY_CARE_PROVIDER_SITE_OTHER): Payer: No Typology Code available for payment source | Admitting: Surgical

## 2022-07-24 DIAGNOSIS — Z9012 Acquired absence of left breast and nipple: Secondary | ICD-10-CM | POA: Diagnosis not present

## 2022-07-24 DIAGNOSIS — J301 Allergic rhinitis due to pollen: Secondary | ICD-10-CM | POA: Diagnosis not present

## 2022-07-24 DIAGNOSIS — C50512 Malignant neoplasm of lower-outer quadrant of left female breast: Secondary | ICD-10-CM

## 2022-07-24 DIAGNOSIS — N651 Disproportion of reconstructed breast: Secondary | ICD-10-CM

## 2022-07-24 DIAGNOSIS — Z170 Estrogen receptor positive status [ER+]: Secondary | ICD-10-CM

## 2022-07-24 DIAGNOSIS — D4862 Neoplasm of uncertain behavior of left breast: Secondary | ICD-10-CM

## 2022-07-24 NOTE — Progress Notes (Signed)
   Subjective:     Patient ID: Lenetta Quaker, female    DOB: 1966-07-19, 56 y.o.   MRN: [id number]  Chief Complaint  Patient presents with   Post-op Follow-up    HPI: The patient is a 56 y.o. female here for follow-up after left breast reconstruction.  She underwent left mastectomy due to left breast cancer.  She had a ultrahigh profile for 025 cc silicone implant placed.  She has previously discussed with Dr. Marla Roe that she is not interested in a mastopexy for symmetry or nipple areola tattooing.   She is approximately 1 year postop from removal of the tissue expanders and placement of left breast implant.  She reports overall she is doing well.  She reports that she is not interested in nipple areolar tattooing or mastopexy for symmetry.  She is overall very happy with her reconstruction.  She reports she recently had a mammogram and ultrasound of the right breast a few months ago and this was normal  Review of Systems  Constitutional: Negative.   Skin: Negative.     Objective:   Vital Signs LMP 01/04/2018  Vital Signs and Nursing Note Reviewed Chaperone present Physical Exam Constitutional:      General: She is not in acute distress.    Appearance: Normal appearance. She is not ill-appearing or toxic-appearing.  HENT:     Head: Normocephalic and atraumatic.  Pulmonary:     Effort: Pulmonary effort is normal.  Chest:    Abdominal:     General: Abdomen is flat.  Neurological:     Mental Status: She is alert.  Psychiatric:        Mood and Affect: Mood normal.        Behavior: Behavior normal.    Breast: Left breast incision is intact, nipple is surgically absent, incisions are well-healed, left breast implant is soft, no capsular contracture is noted.  There is no erythema or cellulitic changes noted.  No subcutaneous fluid collection noted.  Normal right breast is noted.     Assessment/Plan:     ICD-10-CM   1. Acquired absence of left breast  Z90.12      2. Malignant neoplasm of lower-outer quadrant of left breast of female, estrogen receptor positive ([place])  C50.512    Z17.0     3. Neoplasm of uncertain behavior of lower outer quadrant of female breast, left  D48.62     4. Breast asymmetry following reconstructive surgery  N65.1       Patient is a 56 year old female here for follow-up after left breast reconstruction for left breast cancer.  She is doing really well, she is just over 1 year postop from placement of the left breast implant.  She is not having any issues.  We discussed the recommendations for exchange of the implant at 10 years.  We discussed recommended imaging.  Patient is not interested in any further surgical intervention or tattooing.  We did not take pictures today as we reviewed previous photos and there have been no changes.  Recommend calling with questions or concerns. Follow up scheduled for 1 year.  Patient knows we can schedule a follow-up sooner if she has any changes or questions.   Carola Rhine Irbin Fines, PA-C 07/24/2022, 9:07 AM

## 2022-07-30 DIAGNOSIS — J301 Allergic rhinitis due to pollen: Secondary | ICD-10-CM | POA: Diagnosis not present

## 2022-08-06 DIAGNOSIS — J301 Allergic rhinitis due to pollen: Secondary | ICD-10-CM | POA: Diagnosis not present

## 2022-08-13 DIAGNOSIS — J301 Allergic rhinitis due to pollen: Secondary | ICD-10-CM | POA: Diagnosis not present

## 2022-08-20 DIAGNOSIS — J301 Allergic rhinitis due to pollen: Secondary | ICD-10-CM | POA: Diagnosis not present

## 2022-08-27 DIAGNOSIS — J301 Allergic rhinitis due to pollen: Secondary | ICD-10-CM | POA: Diagnosis not present

## 2022-09-02 ENCOUNTER — Inpatient Hospital Stay: Payer: No Typology Code available for payment source | Attending: Internal Medicine | Admitting: Internal Medicine

## 2022-09-02 ENCOUNTER — Encounter: Payer: Self-pay | Admitting: Internal Medicine

## 2022-09-02 VITALS — BP 120/83 | HR 90 | Temp 97.6°F | Resp 16 | Wt 178.0 lb

## 2022-09-02 DIAGNOSIS — I10 Essential (primary) hypertension: Secondary | ICD-10-CM | POA: Insufficient documentation

## 2022-09-02 DIAGNOSIS — C50512 Malignant neoplasm of lower-outer quadrant of left female breast: Secondary | ICD-10-CM | POA: Diagnosis not present

## 2022-09-02 DIAGNOSIS — Z8042 Family history of malignant neoplasm of prostate: Secondary | ICD-10-CM | POA: Diagnosis not present

## 2022-09-02 DIAGNOSIS — Z79899 Other long term (current) drug therapy: Secondary | ICD-10-CM | POA: Diagnosis not present

## 2022-09-02 DIAGNOSIS — Z79811 Long term (current) use of aromatase inhibitors: Secondary | ICD-10-CM | POA: Diagnosis not present

## 2022-09-02 DIAGNOSIS — Z170 Estrogen receptor positive status [ER+]: Secondary | ICD-10-CM | POA: Diagnosis not present

## 2022-09-02 DIAGNOSIS — I427 Cardiomyopathy due to drug and external agent: Secondary | ICD-10-CM | POA: Diagnosis not present

## 2022-09-02 DIAGNOSIS — R109 Unspecified abdominal pain: Secondary | ICD-10-CM | POA: Insufficient documentation

## 2022-09-02 DIAGNOSIS — T451X5A Adverse effect of antineoplastic and immunosuppressive drugs, initial encounter: Secondary | ICD-10-CM

## 2022-09-02 DIAGNOSIS — M2550 Pain in unspecified joint: Secondary | ICD-10-CM | POA: Diagnosis not present

## 2022-09-02 DIAGNOSIS — K219 Gastro-esophageal reflux disease without esophagitis: Secondary | ICD-10-CM | POA: Diagnosis not present

## 2022-09-02 DIAGNOSIS — Z803 Family history of malignant neoplasm of breast: Secondary | ICD-10-CM | POA: Diagnosis not present

## 2022-09-02 DIAGNOSIS — Z7951 Long term (current) use of inhaled steroids: Secondary | ICD-10-CM | POA: Diagnosis not present

## 2022-09-02 MED ORDER — EXEMESTANE 25 MG PO TABS: 25.0000 mg | ORAL_TABLET | Freq: Every day | ORAL | 1 refills | Status: DC

## 2022-09-02 NOTE — Assessment & Plan Note (Addendum)
#  Left breast- CA- s/p mastectomy-  pT1c pN0 [Stage IA]Grade 3.  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  S/p  adjuvant Herceptin [april 2023]. Started anastrozole [Oct 8022]; STABLE;  s/p Adjuvant herceptin [finished April 2023]. MAY 2023- Diagnostic Mammogram Right Unilateral in April 2023.  # currently on aromasin-tolerating well.  Continue the same.  New refill sent.   #Right-sided flank/abdominal pain-likely musculoskeletal.  No concerns for any acute issues.  If worse patient will call us.  # Right shoudler pain- likley MSK radiating to R UE- s/p evaluation with [facility] othro- ok with steroids injection. Monitor closely on Aromasin.   #Drop in ejection fraction-asymptomatic.JULY 2023- LEFT ventricular ejection fraction of 50.4%, little changed from 51.3% on 02/03/2022; baseline- LVEF equals 63.8% on 03/05/2021] will repeat MUGA scan in  FEB 2024.  MUGA scan ordered.  # DISPOSITION: #  Mid FEB 2024 - MD;Lab- cbc/cmp; MUGA scan prior- Dr.B

## 2022-09-02 NOTE — Progress Notes (Signed)
[facility] NOTE  Patient Care Team: [name], MD as PCP - General (Internal Medicine) [name], RN as Registered Nurse Cammie Sickle, MD as Consulting Physician (Internal Medicine) Ronny Bacon, MD as Consulting Physician (General Surgery) [name], [name], DO as Attending Physician (Plastic Surgery)  CHIEF COMPLAINTS/PURPOSE OF CONSULTATION: Breast cancer    Oncology History Overview Note  # April 2022-stage Ia mammary carcinoma ER/PR positive HER2 positive [TRIPLE positive]; negative margins s/p simple mastectomy with plan for immediate reconstruction [Dr.Rodenberg/[name]]; NO RT  # May 2nd, 2022- Taxol-Herceptin  # OCT 3rd, 2022- Anastrazole 63m/day [AUG 2022- BMD- WNL];   # AUG 2023- DISCONTINUE  Anastrazole- sec to MSK side effects; start Aromasin.  # MUGA scan- 64% [03/06/2021]-   # LMP- mid 2020; Uterine ablation- [id number]; # HTN; Asthma- well controlled [on allergy shots];.Marland Kitchen   Malignant neoplasm of lower-outer quadrant of left breast of female, estrogen receptor positive ([facility]  12/25/2020 Initial Diagnosis   Malignant neoplasm of lower-outer quadrant of left breast of female, estrogen receptor positive ([facility]   02/26/2021 Cancer Staging   Staging form: Breast, AJCC 8th Edition - Pathologic: Stage IA (pT1c, pN0, cM0, G3, ER+, PR+, HER2+) - Signed by BCammie Sickle MD on 02/26/2021 Mitotic count score: Score 3 Histologic grading system: 3 grade system   03/10/2021 -  Chemotherapy   Patient is on Treatment Plan : BREAST Paclitaxel + Trastuzumab q7d / Trastuzumab q21d      Genetic Testing   Negative genetic testing. No pathogenic variants identified on the [facility] Multi-Cancer+RNA panel. 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 The report date is 04/29/2021.  The Multi-Cancer Panel + RNA offered by [facility] includes sequencing and/or deletion duplication testing of the following 84 genes: AIP, ALK, APC, ATM,  AXIN2,BAP1,  BARD1, BLM, BMPR1A, BRCA1, BRCA2, BRIP1, CASR, CDC73, CDH1, CDK4, CDKN1B, CDKN1C, CDKN2A (p14ARF), CDKN2A (p16INK4a), CEBPA, CHEK2, CTNNA1, DICER1, DIS3L2, EGFR (c.2369C>T, p.Thr790Met variant only), EPCAM (Deletion/duplication testing only), FH, FLCN, GATA2, GPC3, GREM1 (Promoter region deletion/duplication testing only), HOXB13 (c.251G>A, p.Gly84Glu), HRAS, KIT, MAX, MEN1, MET, MITF (c.952G>A, p.Glu318Lys variant only), MLH1, MSH2, MSH3, MSH6, MUTYH, NBN, NF1, NF2, NTHL1, PALB2, PDGFRA, PHOX2B, PMS2, POLD1, POLE, POT1, PRKAR1A, PTCH1, PTEN, RAD50, RAD51C, RAD51D, RB1, RECQL4, RET, RUNX1, SDHAF2, SDHA (sequence changes only), SDHB, SDHC, SDHD, SMAD4, SMARCA4, SMARCB1, SMARCE1, STK11, SUFU, TERC, TERT, TMEM127, TP53, TSC1, TSC2, VHL, WRN and WT1.      HISTORY OF PRESENTING ILLNESS: Alone.  Ambulating independently.  LLenetta [id number].[id number].  female newly diagnosed breast cancer stage I ER/PR positive HER2/neu positive on adjuvant Herceptin; currently on aromasin is here for follow-up.   Notes her improvement of her joint pains. Not worsening of joint pain.   Notes have intermittent twinges of pain in the right to the abdomen.  Otherwise denies any constant pain.  No constipation or diarrhea.  Review of Systems  Constitutional:  Negative for chills, diaphoresis, fever, malaise/fatigue and weight loss.  HENT:  Negative for nosebleeds and sore throat.   Eyes:  Negative for double vision.  Respiratory:  Negative for cough, hemoptysis, sputum production, shortness of breath and wheezing.   Cardiovascular:  Negative for chest pain, palpitations, orthopnea and leg swelling.  Gastrointestinal:  Negative for abdominal pain, blood in stool, constipation, diarrhea, heartburn, melena, nausea and vomiting.  Genitourinary:  Negative for dysuria, frequency and urgency.  Musculoskeletal:  Negative for back pain and joint pain.  Skin: Negative.  Negative for itching and rash.  Neurological:  Negative for dizziness, tingling, focal weakness, weakness and headaches.  Endo/Heme/Allergies:  Does not bruise/bleed easily.  Psychiatric/Behavioral:  Negative for depression. The patient is not nervous/anxious and does not have insomnia.      MEDICAL HISTORY:  Past Medical History:  Diagnosis Date   Asthma    well controlled   Breast cancer ([place])    Family history of adverse reaction to anesthesia    sister-hard time waking up   Family history of breast cancer    Family history of prostate cancer    Family history of uterine cancer    GERD (gastroesophageal reflux disease)    Hypertension    Personal history of chemotherapy     SURGICAL HISTORY: Past Surgical History:  Procedure Laterality Date   ABLATION     BREAST BIOPSY Left 2011   Benign per pt   BREAST BIOPSY Left 12/12/2020   3:30 3 cmfn, Q marker, pos   BREAST BIOPSY Left 12/12/2020   3:30 1 cmfn, Vision marker, pos   BREAST RECONSTRUCTION WITH PLACEMENT OF TISSUE EXPANDER AND FLEX HD (ACELLULAR HYDRATED DERMIS) Left 02/10/2021   Procedure: IMMEDIATE LEFT BREAST RECONSTRUCTION WITH PLACEMENT OF TISSUE EXPANDER AND FLEX HD (ACELLULAR HYDRATED DERMIS);  Surgeon: Wallace Going, DO;  Location: [facility];  Service: Plastics;  Laterality: Left;   COLONOSCOPY  01/15/2020   MASTECTOMY Left 2022   PORTACATH PLACEMENT Right 02/10/2021   Procedure: INSERTION PORT-A-CATH;  Surgeon: Ronny Bacon, MD;  Location: [facility];  Service: General;  Laterality: Right;   REMOVAL OF TISSUE EXPANDER AND PLACEMENT OF IMPLANT Left 07/09/2021   Procedure: REMOVAL OF TISSUE EXPANDER AND PLACEMENT OF IMPLANT LEFT BREAST;  Surgeon: Wallace Going, DO;  Location: [facility];  Service: Plastics;  Laterality: Left;   SIMPLE MASTECTOMY WITH AXILLARY SENTINEL NODE BIOPSY Left 02/10/2021   Procedure: SIMPLE MASTECTOMY WITH AXILLARY SENTINEL NODE BIOPSY;  Surgeon: Ronny Bacon, MD;  Location: [facility];  Service:  General;  Laterality: Left;    SOCIAL HISTORY: Social History   Socioeconomic History   Marital status: Married    Spouse name: Not on file   Number of children: Not on file   Years of education: Not on file   Highest education level: Not on file  Occupational History   Not on file  Tobacco Use   Smoking status: Never   Smokeless tobacco: Never  Vaping Use   Vaping Use: Never used  Substance and Sexual Activity   Alcohol use: Not Currently   Drug use: Never   Sexual activity: Not on file  Other Topics Concern   Not on file  Social History Narrative   Lives in [place] with husband; kids- college. Works for [employer]- working from home. No smoking or alcohol.    Social Determinants of Health   Financial Resource Strain: Not on file  Food Insecurity: Not on file  Transportation Needs: Not on file  Physical Activity: Not on file  Stress: Not on file  Social Connections: Not on file  Intimate Partner Violence: Not on file    FAMILY HISTORY: Family History  Problem Relation Age of Onset   Hypertension Mother    Diabetes Mother    Hypertension Father    Diabetes Father    Cancer Father        prostate cancer-70s   Cancer Maternal Grandmother        uterine cancer   Breast cancer Sister        in in  32s.     ALLERGIES:  is allergic to shrimp extract allergy skin test and other.  MEDICATIONS:  Current Outpatient Medications  Medication Sig Dispense Refill   albuterol (VENTOLIN HFA) 108 (90 Base) MCG/ACT inhaler Inhale 1 puff into the lungs every 6 (six) hours as needed for shortness of breath.     amLODipine (NORVASC) 10 MG tablet Take 10 mg by mouth every morning.     budesonide (PULMICORT) 0.5 MG/2ML nebulizer solution Take 2 mLs by nebulization daily.     cetirizine (ZYRTEC) 10 MG tablet Take 10 mg by mouth daily as needed for allergies.     Cholecalciferol 1.25 MG (50000 UT) capsule Take 50,000 Units by mouth once a week.     EPINEPHrine  0.3 mg/0.3 mL IJ SOAJ injection Inject 0.3 mg into the muscle as needed for anaphylaxis.     fluticasone (FLONASE) 50 MCG/ACT nasal spray Place 1 spray into both nostrils daily as needed for allergies.     fluticasone furoate-vilanterol (BREO ELLIPTA) 100-25 MCG/INH AEPB Inhale 1 puff into the lungs as needed for shortness of breath.     GNP BUDESONIDE NASAL SPRAY NA Place 1 spray into the nose daily.     Multiple Vitamins-Minerals (CENTRUM ADULTS) TABS Take 1 tablet by mouth daily.     NON FORMULARY Takes weekly allergy shots     pantoprazole (PROTONIX) 40 MG tablet Take 40 mg by mouth every morning.     rosuvastatin (CRESTOR) 40 MG tablet Take 40 mg by mouth daily.     exemestane (AROMASIN) 25 MG tablet Take 1 tablet (25 mg total) by mouth daily after breakfast. 90 tablet 1   No current facility-administered medications for this visit.      Marland Kitchen  PHYSICAL EXAMINATION: ECOG PERFORMANCE STATUS: 0 - Asymptomatic  Vitals:   09/02/22 1020  BP: 120/83  Pulse: 90  Resp: 16  Temp: 97.6 F (36.4 C)  SpO2: 99%   Filed Weights   09/02/22 1020  Weight: 178 lb (80.7 kg)    Physical Exam HENT:     Head: Normocephalic and atraumatic.     Mouth/Throat:     Pharynx: No oropharyngeal exudate.  Eyes:     Pupils: Pupils are equal, round, and reactive to light.  Cardiovascular:     Rate and Rhythm: Normal rate and regular rhythm.  Pulmonary:     Effort: Pulmonary effort is normal. No respiratory distress.     Breath sounds: Normal breath sounds. No wheezing.  Abdominal:     General: Bowel sounds are normal. There is no distension.     Palpations: Abdomen is soft. There is no mass.     Tenderness: There is no abdominal tenderness. There is no guarding or rebound.  Musculoskeletal:        General: No tenderness. Normal range of motion.     Cervical back: Normal range of motion and neck supple.  Skin:    General: Skin is warm.  Neurological:     Mental Status: She is alert and oriented  to person, place, and time.  Psychiatric:        Mood and Affect: Affect normal.      LABORATORY DATA:  I have reviewed the data as listed Lab Results  Component Value Date   WBC 5.4 02/17/2022   HGB 12.7 02/17/2022   HCT 39.0 02/17/2022   MCV 81.4 02/17/2022   PLT 240 02/17/2022   Recent Labs    01/06/22 1249 01/27/22 1323 02/17/22 0900  NA 136 136 137  K 3.8 3.8 3.6  CL 105 104 106  CO2 '26 24 24  ' GLUCOSE 97 94 107*  BUN '19 19 20  ' CREATININE 0.97 0.90 0.78  CALCIUM 9.2 9.2 9.0  GFRNONAA >60 >60 >60  PROT 7.0 7.1 6.9  ALBUMIN 4.1 4.1 4.2  AST '25 25 27  ' ALT 33 34 38  ALKPHOS 75 73 71  BILITOT 0.5 0.4 0.7    RADIOGRAPHIC STUDIES: I have personally reviewed the radiological images as listed and agreed with the findings in the report. No results found.  ASSESSMENT & PLAN:   Malignant neoplasm of lower-outer quadrant of left breast of female, estrogen receptor positive ([place]) # Left breast- CA- s/p mastectomy-  pT1c pN0 [Stage IA]Grade 3.  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  S/p  adjuvant Herceptin [april 2023]. Started anastrozole [Oct 5852]; STABLE;  s/p Adjuvant herceptin [finished April 2023]. MAY 2023- Diagnostic Mammogram Right Unilateral in April 2023.  # currently on aromasin-tolerating well.  Continue the same.  New refill sent.   # Right shoudler pain- likley MSK radiating to R UE- s/p evaluation with Emerge othro- ok with steroids injection. Monitor closely on Aromasin.   #Drop in ejection fraction-asymptomatic.JULY 2023- LEFT ventricular ejection fraction of 50.4%, little changed from 51.3% on 02/03/2022; baseline- LVEF equals 63.8% on 03/05/2021] will repeat MUGA scan in  FEB 2024.  MUGA scan ordered.  # DISPOSITION: #  Mid FEB 2024 - MD;Lab- cbc/cmp; MUGA scan prior- Dr.B   All questions were answered. The patient/family knows to call the clinic with any problems, questions or concerns.    Cammie Sickle, MD 09/02/2022 12:08 PM

## 2022-09-02 NOTE — Progress Notes (Signed)
Pt in for follow up, reports has some intermittent pain in right side of abdomen.

## 2022-09-03 ENCOUNTER — Other Ambulatory Visit: Payer: Self-pay

## 2022-09-03 DIAGNOSIS — J301 Allergic rhinitis due to pollen: Secondary | ICD-10-CM | POA: Diagnosis not present

## 2022-09-04 DIAGNOSIS — J45901 Unspecified asthma with (acute) exacerbation: Secondary | ICD-10-CM | POA: Diagnosis not present

## 2022-09-08 ENCOUNTER — Other Ambulatory Visit: Payer: Self-pay

## 2022-09-14 DIAGNOSIS — S92911A Unspecified fracture of right toe(s), initial encounter for closed fracture: Secondary | ICD-10-CM | POA: Diagnosis not present

## 2022-09-17 DIAGNOSIS — J301 Allergic rhinitis due to pollen: Secondary | ICD-10-CM | POA: Diagnosis not present

## 2022-09-24 DIAGNOSIS — J301 Allergic rhinitis due to pollen: Secondary | ICD-10-CM | POA: Diagnosis not present

## 2022-09-24 DIAGNOSIS — S92911A Unspecified fracture of right toe(s), initial encounter for closed fracture: Secondary | ICD-10-CM | POA: Diagnosis not present

## 2022-09-28 DIAGNOSIS — R1310 Dysphagia, unspecified: Secondary | ICD-10-CM | POA: Diagnosis not present

## 2022-09-28 DIAGNOSIS — K219 Gastro-esophageal reflux disease without esophagitis: Secondary | ICD-10-CM | POA: Diagnosis not present

## 2022-09-28 DIAGNOSIS — R1013 Epigastric pain: Secondary | ICD-10-CM | POA: Diagnosis not present

## 2022-10-08 DIAGNOSIS — K219 Gastro-esophageal reflux disease without esophagitis: Secondary | ICD-10-CM | POA: Diagnosis not present

## 2022-10-08 DIAGNOSIS — E782 Mixed hyperlipidemia: Secondary | ICD-10-CM | POA: Diagnosis not present

## 2022-10-08 DIAGNOSIS — I10 Essential (primary) hypertension: Secondary | ICD-10-CM | POA: Diagnosis not present

## 2022-10-08 DIAGNOSIS — R7302 Impaired glucose tolerance (oral): Secondary | ICD-10-CM | POA: Diagnosis not present

## 2022-10-08 DIAGNOSIS — J4520 Mild intermittent asthma, uncomplicated: Secondary | ICD-10-CM | POA: Diagnosis not present

## 2022-10-08 DIAGNOSIS — J301 Allergic rhinitis due to pollen: Secondary | ICD-10-CM | POA: Diagnosis not present

## 2022-10-08 DIAGNOSIS — J3089 Other allergic rhinitis: Secondary | ICD-10-CM | POA: Diagnosis not present

## 2022-10-12 DIAGNOSIS — Z03818 Encounter for observation for suspected exposure to other biological agents ruled out: Secondary | ICD-10-CM | POA: Diagnosis not present

## 2022-10-12 DIAGNOSIS — J019 Acute sinusitis, unspecified: Secondary | ICD-10-CM | POA: Diagnosis not present

## 2022-10-15 DIAGNOSIS — J301 Allergic rhinitis due to pollen: Secondary | ICD-10-CM | POA: Diagnosis not present

## 2022-10-16 DIAGNOSIS — J301 Allergic rhinitis due to pollen: Secondary | ICD-10-CM | POA: Diagnosis not present

## 2022-10-22 DIAGNOSIS — J301 Allergic rhinitis due to pollen: Secondary | ICD-10-CM | POA: Diagnosis not present

## 2022-10-29 DIAGNOSIS — J301 Allergic rhinitis due to pollen: Secondary | ICD-10-CM | POA: Diagnosis not present

## 2022-11-05 DIAGNOSIS — J301 Allergic rhinitis due to pollen: Secondary | ICD-10-CM | POA: Diagnosis not present

## 2022-11-12 DIAGNOSIS — J301 Allergic rhinitis due to pollen: Secondary | ICD-10-CM | POA: Diagnosis not present

## 2022-11-19 DIAGNOSIS — J301 Allergic rhinitis due to pollen: Secondary | ICD-10-CM | POA: Diagnosis not present

## 2022-11-21 ENCOUNTER — Other Ambulatory Visit: Payer: Self-pay

## 2022-11-25 DIAGNOSIS — H903 Sensorineural hearing loss, bilateral: Secondary | ICD-10-CM | POA: Diagnosis not present

## 2022-11-26 DIAGNOSIS — J4520 Mild intermittent asthma, uncomplicated: Secondary | ICD-10-CM | POA: Diagnosis not present

## 2022-11-26 DIAGNOSIS — J329 Chronic sinusitis, unspecified: Secondary | ICD-10-CM | POA: Diagnosis not present

## 2022-11-26 DIAGNOSIS — J339 Nasal polyp, unspecified: Secondary | ICD-10-CM | POA: Diagnosis not present

## 2022-11-26 DIAGNOSIS — J309 Allergic rhinitis, unspecified: Secondary | ICD-10-CM | POA: Diagnosis not present

## 2022-11-26 DIAGNOSIS — J301 Allergic rhinitis due to pollen: Secondary | ICD-10-CM | POA: Diagnosis not present

## 2022-12-03 DIAGNOSIS — J301 Allergic rhinitis due to pollen: Secondary | ICD-10-CM | POA: Diagnosis not present

## 2022-12-08 DIAGNOSIS — J4540 Moderate persistent asthma, uncomplicated: Secondary | ICD-10-CM | POA: Diagnosis not present

## 2022-12-08 DIAGNOSIS — R0602 Shortness of breath: Secondary | ICD-10-CM | POA: Diagnosis not present

## 2022-12-10 DIAGNOSIS — J301 Allergic rhinitis due to pollen: Secondary | ICD-10-CM | POA: Diagnosis not present

## 2022-12-17 DIAGNOSIS — J301 Allergic rhinitis due to pollen: Secondary | ICD-10-CM | POA: Diagnosis not present

## 2022-12-24 DIAGNOSIS — J301 Allergic rhinitis due to pollen: Secondary | ICD-10-CM | POA: Diagnosis not present

## 2022-12-29 ENCOUNTER — Encounter
Admission: RE | Admit: 2022-12-29 | Discharge: 2022-12-29 | Disposition: A | Discharge status: Home / Self Care | Payer: No Typology Code available for payment source | Source: Ambulatory Visit | Attending: Internal Medicine | Admitting: Internal Medicine

## 2022-12-29 DIAGNOSIS — C801 Malignant (primary) neoplasm, unspecified: Secondary | ICD-10-CM | POA: Diagnosis not present

## 2022-12-29 DIAGNOSIS — T451X5A Adverse effect of antineoplastic and immunosuppressive drugs, initial encounter: Secondary | ICD-10-CM | POA: Insufficient documentation

## 2022-12-29 DIAGNOSIS — Z170 Estrogen receptor positive status [ER+]: Secondary | ICD-10-CM

## 2022-12-29 DIAGNOSIS — I427 Cardiomyopathy due to drug and external agent: Secondary | ICD-10-CM | POA: Diagnosis not present

## 2022-12-29 DIAGNOSIS — C50512 Malignant neoplasm of lower-outer quadrant of left female breast: Secondary | ICD-10-CM | POA: Insufficient documentation

## 2022-12-29 MED ORDER — TECHNETIUM TC 99M-LABELED RED BLOOD CELLS IV KIT
21.0200 | PACK | Freq: Once | INTRAVENOUS | Status: AC | PRN
  Administered 2022-12-29: 21.02 via INTRAVENOUS

## 2023-01-02 ENCOUNTER — Other Ambulatory Visit: Payer: Self-pay | Admitting: Internal Medicine

## 2023-01-04 ENCOUNTER — Encounter: Payer: Self-pay | Admitting: Internal Medicine

## 2023-01-04 ENCOUNTER — Inpatient Hospital Stay (HOSPITAL_BASED_OUTPATIENT_CLINIC_OR_DEPARTMENT_OTHER): Payer: No Typology Code available for payment source | Admitting: Internal Medicine

## 2023-01-04 ENCOUNTER — Inpatient Hospital Stay: Payer: No Typology Code available for payment source | Attending: Internal Medicine

## 2023-01-04 VITALS — BP 125/86 | HR 72 | Temp 96.0°F | Resp 16 | Wt 174.4 lb

## 2023-01-04 DIAGNOSIS — Z8049 Family history of malignant neoplasm of other genital organs: Secondary | ICD-10-CM | POA: Diagnosis not present

## 2023-01-04 DIAGNOSIS — Z8042 Family history of malignant neoplasm of prostate: Secondary | ICD-10-CM | POA: Insufficient documentation

## 2023-01-04 DIAGNOSIS — Z170 Estrogen receptor positive status [ER+]: Secondary | ICD-10-CM | POA: Insufficient documentation

## 2023-01-04 DIAGNOSIS — Z803 Family history of malignant neoplasm of breast: Secondary | ICD-10-CM | POA: Diagnosis not present

## 2023-01-04 DIAGNOSIS — I10 Essential (primary) hypertension: Secondary | ICD-10-CM | POA: Insufficient documentation

## 2023-01-04 DIAGNOSIS — C50512 Malignant neoplasm of lower-outer quadrant of left female breast: Secondary | ICD-10-CM | POA: Diagnosis not present

## 2023-01-04 DIAGNOSIS — M2550 Pain in unspecified joint: Secondary | ICD-10-CM | POA: Insufficient documentation

## 2023-01-04 DIAGNOSIS — Z833 Family history of diabetes mellitus: Secondary | ICD-10-CM | POA: Diagnosis not present

## 2023-01-04 DIAGNOSIS — Z79899 Other long term (current) drug therapy: Secondary | ICD-10-CM | POA: Insufficient documentation

## 2023-01-04 DIAGNOSIS — Z8249 Family history of ischemic heart disease and other diseases of the circulatory system: Secondary | ICD-10-CM | POA: Diagnosis not present

## 2023-01-04 DIAGNOSIS — J45909 Unspecified asthma, uncomplicated: Secondary | ICD-10-CM | POA: Insufficient documentation

## 2023-01-04 DIAGNOSIS — Z79811 Long term (current) use of aromatase inhibitors: Secondary | ICD-10-CM | POA: Insufficient documentation

## 2023-01-04 LAB — COMPREHENSIVE METABOLIC PANEL
ALT: 24 U/L (ref 0–44)
AST: 22 U/L (ref 15–41)
Albumin: 4.4 g/dL (ref 3.5–5.0)
Alkaline Phosphatase: 81 U/L (ref 38–126)
Anion gap: 11 (ref 5–15)
BUN: 16 mg/dL (ref 6–20)
CO2: 22 mmol/L (ref 22–32)
Calcium: 9.1 mg/dL (ref 8.9–10.3)
Chloride: 106 mmol/L (ref 98–111)
Creatinine, Ser: 0.8 mg/dL (ref 0.44–1.00)
GFR, Estimated: >60 mL/min (ref >60)
Glucose, Bld: 102 mg/dL — ABNORMAL HIGH (ref 70–99)
Potassium: 3.9 mmol/L (ref 3.5–5.1)
Sodium: 139 mmol/L (ref 135–145)
Total Bilirubin: 0.7 mg/dL (ref 0.3–1.2)
Total Protein: 7.1 g/dL (ref 6.5–8.1)

## 2023-01-04 LAB — CBC WITH DIFFERENTIAL/PLATELET
Abs Immature Granulocytes: 0.01 10*3/uL (ref 0.00–0.07)
Basophils Absolute: 0 10*3/uL (ref 0.0–0.1)
Basophils Relative: 1 %
Eosinophils Absolute: 0.6 10*3/uL — ABNORMAL HIGH (ref 0.0–0.5)
Eosinophils Relative: 14 %
HCT: 41.1 % (ref 36.0–46.0)
Hemoglobin: 13.3 g/dL (ref 12.0–15.0)
Immature Granulocytes: 0 %
Lymphocytes Relative: 40 %
Lymphs Abs: 1.8 10*3/uL (ref 0.7–4.0)
MCH: 26.5 pg (ref 26.0–34.0)
MCHC: 32.4 g/dL (ref 30.0–36.0)
MCV: 81.9 fL (ref 80.0–100.0)
Monocytes Absolute: 0.4 10*3/uL (ref 0.1–1.0)
Monocytes Relative: 10 %
Neutro Abs: 1.5 10*3/uL — ABNORMAL LOW (ref 1.7–7.7)
Neutrophils Relative %: 35 %
Platelets: 240 10*3/uL (ref 150–400)
RBC: 5.02 MIL/uL (ref 3.87–5.11)
RDW: 14.3 % (ref 11.5–15.5)
WBC: 4.3 10*3/uL (ref 4.0–10.5)
nRBC: 0 % (ref 0.0–0.2)

## 2023-01-04 NOTE — Progress Notes (Signed)
Patient denies new problems/concerns today.   °

## 2023-01-04 NOTE — Assessment & Plan Note (Signed)
#   APRIL 2022- Left breast- CA- s/p mastectomy-  pT1c pN0 [Stage IA]Grade 3.  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  S/p  adjuvant Herceptin [april 2023]. Started anastrozole [Oct [id number]; STABLE;  s/p Adjuvant herceptin [finished April 2023]. MAY 2023- Diagnostic Mammogram Right Unilateral in April 2023.  # currently on aromasin-tolerating well.  Continue the same.  New refill sent.   # Musculoskeletal.  No concerns for any acute issues- Emerge othro- ok with steroids injection.  monitor for now.   #Drop in ejection fraction- Asymptomatic. FEB 2024- left ventricular ejection fraction equals 55.7 % (comparable to 50.4 % on most recent MUGA scan)- stable  # 2022- The BMD measured at Femur Neck Right is 1.082 g/cm2 with a T-score of 0.3. T; on ca+vit D. Will repeat again in 2-24.   # PIV:   # DISPOSITION: # mammogram/Dexa scan in MAY 2024-  #  follow up in 4 months - MD; labs- cbc/cmp-- Dr.B

## 2023-01-04 NOTE — Progress Notes (Signed)
[place] NOTE  Patient Care Team: [name], MD as PCP - General (Internal Medicine) [name], RN as Registered Nurse Cammie Sickle, MD as Consulting Physician (Internal Medicine) Ronny Bacon, MD as Consulting Physician (General Surgery) [name], [name], DO as Attending Physician (Plastic Surgery)  CHIEF COMPLAINTS/PURPOSE OF CONSULTATION: Breast cancer    Oncology History Overview Note  # April 2022-stage Ia mammary carcinoma ER/PR positive HER2 positive [TRIPLE positive]; negative margins s/p simple mastectomy with plan for immediate reconstruction [Dr.Rodenberg/[name]]; NO RT  # May 2nd, 2022- Taxol-Herceptin  # OCT 3rd, 2022- Anastrazole '1mg'$ /day [AUG 2022- BMD- WNL];   # AUG 2023- DISCONTINUE  Anastrazole- sec to MSK side effects; start Aromasin.  # MUGA scan- 64% [03/06/2021]-   # LMP- mid 2020; Uterine ablation- [phone]; # HTN; Asthma- well controlled [on allergy shots];Marland Kitchen    Malignant neoplasm of lower-outer quadrant of left breast of female, estrogen receptor positive ([place])  12/25/2020 Initial Diagnosis   Malignant neoplasm of lower-outer quadrant of left breast of female, estrogen receptor positive ([place])   02/26/2021 Cancer Staging   Staging form: Breast, AJCC 8th Edition - Pathologic: Stage IA (pT1c, pN0, cM0, G3, ER+, PR+, HER2+) - Signed by Cammie Sickle, MD on 02/26/2021 Mitotic count score: Score 3 Histologic grading system: 3 grade system   03/10/2021 -  Chemotherapy   Patient is on Treatment Plan : BREAST Paclitaxel + Trastuzumab q7d / Trastuzumab q21d      Genetic Testing   Negative genetic testing. No pathogenic variants identified on the [facility] Multi-Cancer+RNA panel. 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 The report date is 04/29/2021.  The Multi-Cancer Panel + RNA offered by [facility] includes sequencing and/or deletion duplication testing of the following 84 genes: AIP, ALK, APC, ATM,  AXIN2,BAP1,  BARD1, BLM, BMPR1A, BRCA1, BRCA2, BRIP1, CASR, CDC73, CDH1, CDK4, CDKN1B, CDKN1C, CDKN2A (p14ARF), CDKN2A (p16INK4a), CEBPA, CHEK2, CTNNA1, DICER1, DIS3L2, EGFR (c.2369C>T, p.Thr790Met variant only), EPCAM (Deletion/duplication testing only), FH, FLCN, GATA2, GPC3, GREM1 (Promoter region deletion/duplication testing only), HOXB13 (c.251G>A, p.Gly84Glu), HRAS, KIT, MAX, MEN1, MET, MITF (c.952G>A, p.Glu318Lys variant only), MLH1, MSH2, MSH3, MSH6, MUTYH, NBN, NF1, NF2, NTHL1, PALB2, PDGFRA, PHOX2B, PMS2, POLD1, POLE, POT1, PRKAR1A, PTCH1, PTEN, RAD50, RAD51C, RAD51D, RB1, RECQL4, RET, RUNX1, SDHAF2, SDHA (sequence changes only), SDHB, SDHC, SDHD, SMAD4, SMARCA4, SMARCB1, SMARCE1, STK11, SUFU, TERC, TERT, TMEM127, TP53, TSC1, TSC2, VHL, WRN and WT1.      HISTORY OF PRESENTING ILLNESS: Alone.  Ambulating independently.  Judy Padilla 57 y.o.  female newly diagnosed breast cancer stage I ER/PR positive HER2/neu positive on adjuvant Herceptin; currently on aromasin is here for follow-up/review results of the MUGA scan.  Notes her improvement of her joint pains. Not worsening of joint pain.  No worsening hot flashes.  No constipation or diarrhea.  Review of Systems  Constitutional:  Negative for chills, diaphoresis, fever, malaise/fatigue and weight loss.  HENT:  Negative for nosebleeds and sore throat.   Eyes:  Negative for double vision.  Respiratory:  Negative for cough, hemoptysis, sputum production, shortness of breath and wheezing.   Cardiovascular:  Negative for chest pain, palpitations, orthopnea and leg swelling.  Gastrointestinal:  Negative for abdominal pain, blood in stool, constipation, diarrhea, heartburn, melena, nausea and vomiting.  Genitourinary:  Negative for dysuria, frequency and urgency.  Musculoskeletal:  Negative for back pain and joint pain.  Skin: Negative.  Negative for itching and rash.  Neurological:  Negative for dizziness, tingling, focal weakness,  weakness and headaches.  Endo/Heme/Allergies:  Does not bruise/bleed easily.  Psychiatric/Behavioral:  Negative for depression. The patient is not nervous/anxious and does not have insomnia.      MEDICAL HISTORY:  Past Medical History:  Diagnosis Date   Asthma    well controlled   Breast cancer ([place])    Family history of adverse reaction to anesthesia    sister-hard time waking up   Family history of breast cancer    Family history of prostate cancer    Family history of uterine cancer    GERD (gastroesophageal reflux disease)    Hypertension    Personal history of chemotherapy     SURGICAL HISTORY: Past Surgical History:  Procedure Laterality Date   ABLATION     BREAST BIOPSY Left 2011   Benign per pt   BREAST BIOPSY Left 12/12/2020   3:30 3 cmfn, Q marker, pos   BREAST BIOPSY Left 12/12/2020   3:30 1 cmfn, Vision marker, pos   BREAST RECONSTRUCTION WITH PLACEMENT OF TISSUE EXPANDER AND FLEX HD (ACELLULAR HYDRATED DERMIS) Left 02/10/2021   Procedure: IMMEDIATE LEFT BREAST RECONSTRUCTION WITH PLACEMENT OF TISSUE EXPANDER AND FLEX HD (ACELLULAR HYDRATED DERMIS);  Surgeon: Wallace Going, DO;  Location: [facility];  Service: Plastics;  Laterality: Left;   COLONOSCOPY  01/15/2020   MASTECTOMY Left 2022   PORTACATH PLACEMENT Right 02/10/2021   Procedure: INSERTION PORT-A-CATH;  Surgeon: Ronny Bacon, MD;  Location: [facility];  Service: General;  Laterality: Right;   REMOVAL OF TISSUE EXPANDER AND PLACEMENT OF IMPLANT Left 07/09/2021   Procedure: REMOVAL OF TISSUE EXPANDER AND PLACEMENT OF IMPLANT LEFT BREAST;  Surgeon: Wallace Going, DO;  Location: [facility];  Service: Plastics;  Laterality: Left;   SIMPLE MASTECTOMY WITH AXILLARY SENTINEL NODE BIOPSY Left 02/10/2021   Procedure: SIMPLE MASTECTOMY WITH AXILLARY SENTINEL NODE BIOPSY;  Surgeon: Ronny Bacon, MD;  Location: [facility];  Service: General;  Laterality: Left;    SOCIAL HISTORY: Social  History   Socioeconomic History   Marital status: Married    Spouse name: Not on file   Number of children: Not on file   Years of education: Not on file   Highest education level: Not on file  Occupational History   Not on file  Tobacco Use   Smoking status: Never   Smokeless tobacco: Never  Vaping Use   Vaping Use: Never used  Substance and Sexual Activity   Alcohol use: Not Currently   Drug use: Never   Sexual activity: Not on file  Other Topics Concern   Not on file  Social History Narrative   Lives in [place] with husband; kids- college. Works for [employer]- working from home. No smoking or alcohol.    Social Determinants of Health   Financial Resource Strain: Not on file  Food Insecurity: Not on file  Transportation Needs: Not on file  Physical Activity: Not on file  Stress: Not on file  Social Connections: Not on file  Intimate Partner Violence: Not on file    FAMILY HISTORY: Family History  Problem Relation Age of Onset   Hypertension Mother    Diabetes Mother    Hypertension Father    Diabetes Father    Cancer Father        prostate cancer-70s   Cancer Maternal Grandmother        uterine cancer   Breast cancer Sister        in in 59s.     ALLERGIES:  is allergic to  shrimp extract allergy skin test and other.  MEDICATIONS:  Current Outpatient Medications  Medication Sig Dispense Refill   AIRSUPRA 90-80 MCG/ACT AERO Inhale 2 puffs into the lungs daily.     albuterol (VENTOLIN HFA) 108 (90 Base) MCG/ACT inhaler Inhale 1 puff into the lungs every 6 (six) hours as needed for shortness of breath.     amLODipine (NORVASC) 10 MG tablet Take 10 mg by mouth every morning.     budesonide (PULMICORT) 0.5 MG/2ML nebulizer solution Take 2 mLs by nebulization daily.     cetirizine (ZYRTEC) 10 MG tablet Take 10 mg by mouth daily as needed for allergies.     Cholecalciferol 1.25 MG (50000 UT) capsule Take 50,000 Units by mouth once a week.      EPINEPHrine 0.3 mg/0.3 mL IJ SOAJ injection Inject 0.3 mg into the muscle as needed for anaphylaxis.     exemestane (AROMASIN) 25 MG tablet Take 1 tablet (25 mg total) by mouth daily after breakfast. 90 tablet 1   famotidine (PEPCID) 20 MG tablet TAKE 1 TABLET BY MOUTH EVERY DAY 90 tablet 1   fluticasone (FLONASE) 50 MCG/ACT nasal spray Place 1 spray into both nostrils daily as needed for allergies.     GNP BUDESONIDE NASAL SPRAY NA Place 1 spray into the nose daily.     Multiple Vitamins-Minerals (CENTRUM ADULTS) TABS Take 1 tablet by mouth daily.     NON FORMULARY Takes weekly allergy shots     pantoprazole (PROTONIX) 40 MG tablet Take 40 mg by mouth every morning.     rosuvastatin (CRESTOR) 40 MG tablet Take 40 mg by mouth daily.     fluticasone furoate-vilanterol (BREO ELLIPTA) 100-25 MCG/INH AEPB Inhale 1 puff into the lungs as needed for shortness of breath. (Patient not taking: Reported on 01/04/2023)     No current facility-administered medications for this visit.      Marland Kitchen  PHYSICAL EXAMINATION: ECOG PERFORMANCE STATUS: 0 - Asymptomatic  Vitals:   01/04/23 0900  BP: 125/86  Pulse: 72  Resp: 16  Temp: (!) 96 F (35.6 C)   Filed Weights   01/04/23 0900  Weight: 174 lb 6.4 oz (79.1 kg)    Physical Exam HENT:     Head: Normocephalic and atraumatic.     Mouth/Throat:     Pharynx: No oropharyngeal exudate.  Eyes:     Pupils: Pupils are equal, round, and reactive to light.  Cardiovascular:     Rate and Rhythm: Normal rate and regular rhythm.  Pulmonary:     Effort: Pulmonary effort is normal. No respiratory distress.     Breath sounds: Normal breath sounds. No wheezing.  Abdominal:     General: Bowel sounds are normal. There is no distension.     Palpations: Abdomen is soft. There is no mass.     Tenderness: There is no abdominal tenderness. There is no guarding or rebound.  Musculoskeletal:        General: No tenderness. Normal range of motion.     Cervical back:  Normal range of motion and neck supple.  Skin:    General: Skin is warm.  Neurological:     Mental Status: She is alert and oriented to person, place, and time.  Psychiatric:        Mood and Affect: Affect normal.      LABORATORY DATA:  I have reviewed the data as listed Lab Results  Component Value Date   WBC 4.3 01/04/2023   HGB 13.3 01/04/2023  HCT 41.1 01/04/2023   MCV 81.9 01/04/2023   PLT 240 01/04/2023   Recent Labs    01/27/22 1323 02/17/22 0900 01/04/23 0909  NA 136 137 139  K 3.8 3.6 3.9  CL 104 106 106  CO2 '24 24 22  '$ GLUCOSE 94 107* 102*  BUN '19 20 16  '$ CREATININE 0.90 0.78 0.80  CALCIUM 9.2 9.0 9.1  GFRNONAA >60 >60 >60  PROT 7.1 6.9 7.1  ALBUMIN 4.1 4.2 4.4  AST '25 27 22  '$ ALT 34 38 24  ALKPHOS 73 71 81  BILITOT 0.4 0.7 0.7    RADIOGRAPHIC STUDIES: I have personally reviewed the radiological images as listed and agreed with the findings in the report. NM Cardiac Muga Rest  Result Date: 12/29/2022 CLINICAL DATA:  55.7 cancer. Evaluate cardiac function in relation to chemotherapy. EXAM: NUCLEAR MEDICINE CARDIAC BLOOD POOL IMAGING (MUGA) TECHNIQUE: Cardiac multi-gated acquisition was performed at rest following intravenous injection of Tc-62mlabeled red blood cells. RADIOPHARMACEUTICALS:  21.0 mCi Tc-938mertechnetate in-vitro labeled red blood cells IV COMPARISON:  MUGA scan 05/06/2022 FINDINGS: No  focal wall motion abnormality of the left ventricle. Calculated left ventricular ejection fraction equals 55.7 % (comparable to 50.4 % on most recent MUGA scan) IMPRESSION: Left ventricular ejection fraction equals55.7 %. Electronically Signed   By: StSuzy Bouchard.D.   On: 12/29/2022 15:01    ASSESSMENT & PLAN:   Malignant neoplasm of lower-outer quadrant of left breast of female, estrogen receptor positive ([place]# APRIL 2022- Left breast- CA- s/p mastectomy-  pT1c pN0 [Stage IA]Grade 3.  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  S/p  adjuvant Herceptin [april  2023]. Started anastrozole [Oct [id number];  s/p Adjuvant herceptin [finished April 2023]. MAY 2023- Diagnostic Mammogram Right Unilateral in April 2023.  # currently on aromasin-tolerating well.  Continue the same.  New refill sent.   # Musculoskeletal.  No concerns for any acute issues- Emerge othro- ok with steroids injection.  monitor for now.   #Drop in ejection fraction- Asymptomatic. FEB 2024- left ventricular ejection fraction equals 55.7 % (comparable to 50.4 % on most recent MUGA scan)- stable  # 2022- The BMD measured at Femur Neck Right is 1.082 g/cm2 with a T-score of 0.3. T; on ca+vit D. Will repeat again in 2-24.   # PIV:   # DISPOSITION: # mammogram/Dexa scan in MAY 2024-  #  follow up in 4 months - MD; labs- cbc/cmp-- Dr.B   All questions were answered. The patient/family knows to call the clinic with any problems, questions or concerns.    GoCammie SickleMD 01/04/2023 9:52 AM

## 2023-01-06 DIAGNOSIS — J301 Allergic rhinitis due to pollen: Secondary | ICD-10-CM | POA: Diagnosis not present

## 2023-01-07 DIAGNOSIS — J301 Allergic rhinitis due to pollen: Secondary | ICD-10-CM | POA: Diagnosis not present

## 2023-01-12 ENCOUNTER — Encounter: Payer: Self-pay | Admitting: Internal Medicine

## 2023-01-14 ENCOUNTER — Ambulatory Visit
Admission: EM | Admit: 2023-01-14 | Discharge: 2023-01-14 | Disposition: A | Discharge status: Home / Self Care | Payer: No Typology Code available for payment source | Attending: Urgent Care | Admitting: Urgent Care

## 2023-01-14 DIAGNOSIS — J019 Acute sinusitis, unspecified: Secondary | ICD-10-CM | POA: Diagnosis not present

## 2023-01-14 DIAGNOSIS — J45901 Unspecified asthma with (acute) exacerbation: Secondary | ICD-10-CM

## 2023-01-14 DIAGNOSIS — B9689 Other specified bacterial agents as the cause of diseases classified elsewhere: Secondary | ICD-10-CM | POA: Diagnosis not present

## 2023-01-14 MED ORDER — AMOXICILLIN-POT CLAVULANATE 875-125 MG PO TABS: 1.0000 | ORAL_TABLET | Freq: Two times a day (BID) | ORAL | 0 refills | Status: DC

## 2023-01-14 MED ORDER — PREDNISONE 20 MG PO TABS: 60.0000 mg | ORAL_TABLET | Freq: Every day | ORAL | 0 refills | Status: AC

## 2023-01-14 MED ORDER — METHYLPREDNISOLONE SODIUM SUCC 125 MG IJ SOLR
60.0000 mg | Freq: Once | INTRAMUSCULAR | Status: AC
  Administered 2023-01-14: 60 mg via INTRAMUSCULAR

## 2023-01-14 NOTE — ED Triage Notes (Signed)
Patient presents to UC for nasal congestion, SOB, cough x 2 weeks. States she is taken her rescue inhaler and daily inhaler but not helping.

## 2023-01-14 NOTE — Discharge Instructions (Signed)
Follow up here or with your primary care provider if your symptoms are worsening or not improving with treatment.          

## 2023-01-14 NOTE — ED Provider Notes (Signed)
[facility]    CSN: [id number] Arrival date & time: 01/14/23  1847      History   Chief Complaint Chief Complaint  Patient presents with   Cough   Shortness of Breath   Nasal Congestion   Asthma    HPI Judy Padilla is a 57 y.o. female.    Cough Associated symptoms: shortness of breath   Shortness of Breath Associated symptoms: cough   Asthma Associated symptoms include shortness of breath.    Presents to urgent care with nasal congestion, shortness of breath, cough x 2 weeks.  Using rescue inhaler but not helping.  Past Medical History:  Diagnosis Date   Asthma    well controlled   Breast cancer ([place])    Family history of adverse reaction to anesthesia    sister-hard time waking up   Family history of breast cancer    Family history of prostate cancer    Family history of uterine cancer    GERD (gastroesophageal reflux disease)    Hypertension    Personal history of chemotherapy     Patient Active Problem List   Diagnosis Date Noted   Acquired absence of breast 05/30/2021   Breast asymmetry following reconstructive surgery 05/30/2021   Genetic testing 04/29/2021   Family history of breast cancer    Family history of uterine cancer    Family history of prostate cancer    Rectal bleeding 03/24/2021   Encounter for antineoplastic chemotherapy 03/24/2021   Malignant neoplasm of lower-outer quadrant of left breast of female, estrogen receptor positive ([place]) 12/25/2020    Past Surgical History:  Procedure Laterality Date   ABLATION     BREAST BIOPSY Left 2011   Benign per pt   BREAST BIOPSY Left 12/12/2020   3:30 3 cmfn, Q marker, pos   BREAST BIOPSY Left 12/12/2020   3:30 1 cmfn, Vision marker, pos   BREAST RECONSTRUCTION WITH PLACEMENT OF TISSUE EXPANDER AND FLEX HD (ACELLULAR HYDRATED DERMIS) Left 02/10/2021   Procedure: IMMEDIATE LEFT BREAST RECONSTRUCTION WITH PLACEMENT OF TISSUE EXPANDER AND FLEX HD (ACELLULAR HYDRATED DERMIS);   Surgeon: Wallace Going, DO;  Location: [facility];  Service: Plastics;  Laterality: Left;   COLONOSCOPY  01/15/2020   MASTECTOMY Left 2022   PORTACATH PLACEMENT Right 02/10/2021   Procedure: INSERTION PORT-A-CATH;  Surgeon: Ronny Bacon, MD;  Location: [facility];  Service: General;  Laterality: Right;   REMOVAL OF TISSUE EXPANDER AND PLACEMENT OF IMPLANT Left 07/09/2021   Procedure: REMOVAL OF TISSUE EXPANDER AND PLACEMENT OF IMPLANT LEFT BREAST;  Surgeon: Wallace Going, DO;  Location: [facility];  Service: Plastics;  Laterality: Left;   SIMPLE MASTECTOMY WITH AXILLARY SENTINEL NODE BIOPSY Left 02/10/2021   Procedure: SIMPLE MASTECTOMY WITH AXILLARY SENTINEL NODE BIOPSY;  Surgeon: Ronny Bacon, MD;  Location: [facility];  Service: General;  Laterality: Left;    OB History   No obstetric history on file.      Home Medications    Prior to Admission medications   Medication Sig Start Date End Date Taking? Authorizing Provider  AIRSUPRA 90-80 MCG/ACT AERO Inhale 2 puffs into the lungs daily.    [provider]  albuterol (VENTOLIN HFA) 108 (90 Base) MCG/ACT inhaler Inhale 1 puff into the lungs every 6 (six) hours as needed for shortness of breath. 03/08/20   [provider]  amLODipine (NORVASC) 10 MG tablet Take 10 mg by mouth every morning.    [provider]  budesonide (  PULMICORT) 0.5 MG/2ML nebulizer solution Take 2 mLs by nebulization daily. 09/30/20   [provider]  cetirizine (ZYRTEC) 10 MG tablet Take 10 mg by mouth daily as needed for allergies.    [provider]  Cholecalciferol 1.25 MG (50000 UT) capsule Take 50,000 Units by mouth once a week. 09/26/19   [provider]  EPINEPHrine 0.3 mg/0.3 mL IJ SOAJ injection Inject 0.3 mg into the muscle as needed for anaphylaxis.    [provider]  exemestane (AROMASIN) 25 MG tablet Take 1 tablet (25 mg total) by mouth daily after breakfast.  09/02/22   Cammie Sickle, MD  famotidine (PEPCID) 20 MG tablet TAKE 1 TABLET BY MOUTH EVERY DAY 01/04/23   [name], MD  fluticasone (FLONASE) 50 MCG/ACT nasal spray Place 1 spray into both nostrils daily as needed for allergies.    [provider]  fluticasone furoate-vilanterol (BREO ELLIPTA) 100-25 MCG/INH AEPB Inhale 1 puff into the lungs as needed for shortness of breath. Patient not taking: Reported on 01/04/2023 11/26/19   [provider]  GNP BUDESONIDE NASAL SPRAY NA Place 1 spray into the nose daily.    [provider]  Multiple Vitamins-Minerals (CENTRUM ADULTS) TABS Take 1 tablet by mouth daily.    [provider]  NON FORMULARY Takes weekly allergy shots    [provider]  pantoprazole (PROTONIX) 40 MG tablet Take 40 mg by mouth every morning.    [provider]  rosuvastatin (CRESTOR) 40 MG tablet Take 40 mg by mouth daily. 04/08/21   [provider]    Family History Family History  Problem Relation Age of Onset   Hypertension Mother    Diabetes Mother    Hypertension Father    Diabetes Father    Cancer Father        prostate cancer-70s   Cancer Maternal Grandmother        uterine cancer   Breast cancer Sister        in in 48s.     Social History Social History   Tobacco Use   Smoking status: Never   Smokeless tobacco: Never  Vaping Use   Vaping Use: Never used  Substance Use Topics   Alcohol use: Not Currently   Drug use: Never     Allergies   Shrimp extract and Other   Review of Systems Review of Systems  Respiratory:  Positive for cough and shortness of breath.      Physical Exam Triage Vital Signs ED Triage Vitals [01/14/23 1911]  Enc Vitals Group     BP 136/79     Pulse Rate 85     Resp (!) 22     Temp 98.5 F (36.9 C)     Temp Source Oral     SpO2 95 %     Weight      Height      Head Circumference      Peak Flow      Pain Score      Pain Loc      Pain  Edu?      Excl. in [place]?    No data found.  Updated Vital Signs BP 136/79 (BP Location: Left Arm)   Pulse 85   Temp 98.5 F (36.9 C) (Oral)   Resp (!) 22   LMP 01/04/2018   SpO2 95%   Visual Acuity Right Eye Distance:   Left Eye Distance:   Bilateral Distance:    Right  Eye Near:   Left Eye Near:    Bilateral Near:     Physical Exam Vitals reviewed.  Constitutional:      Appearance: She is well-developed. She is ill-appearing.  Pulmonary:     Breath sounds: Examination of the right-upper field reveals wheezing. Examination of the left-upper field reveals wheezing. Examination of the right-middle field reveals wheezing. Examination of the left-middle field reveals wheezing. Examination of the right-lower field reveals wheezing. Examination of the left-lower field reveals wheezing. Wheezing present.  Skin:    General: Skin is warm and dry.  Neurological:     General: No focal deficit present.     Mental Status: She is alert and oriented to person, place, and time.  Psychiatric:        Mood and Affect: Mood normal.        Behavior: Behavior normal.      UC Treatments / Results  Labs (all labs ordered are listed, but only abnormal results are displayed) Labs Reviewed - No data to display  EKG   Radiology No results found.  Procedures Procedures (including critical care time)  Medications Ordered in UC Medications - No data to display  Initial Impression / Assessment and Plan / UC Course  I have reviewed the triage vital signs and the nursing notes.  Pertinent labs & imaging results that were available during my care of the patient were reviewed by me and considered in my medical decision making (see chart for details).   Patient is afebrile here without recent antipyretics. Satting well on room air, elevated RR. Overall is ill appearing, well hydrated, without respiratory distress. Pulmonary exam is remarkable for audible wheezes.  Patient's symptoms are  consistent with an exacerbation of asthma which we will treat with an injection of Solu-Medrol in clinic as well as prednisone at discharge.  Given her continued upper respiratory symptoms, concern for acute bacterial sinusitis which we will treat with Augmentin.  Final Clinical Impressions(s) / UC Diagnoses   Final diagnoses:  None   Discharge Instructions   None    ED Prescriptions   None    PDMP not reviewed this encounter.   Rose Phi, [place] 01/14/23 1933

## 2023-01-21 DIAGNOSIS — J301 Allergic rhinitis due to pollen: Secondary | ICD-10-CM | POA: Diagnosis not present

## 2023-01-22 ENCOUNTER — Ambulatory Visit
Admission: RE | Admit: 2023-01-22 | Discharge: 2023-01-22 | Disposition: A | Discharge status: Home / Self Care | Payer: No Typology Code available for payment source | Source: Ambulatory Visit | Attending: Internal Medicine | Admitting: Internal Medicine

## 2023-01-22 ENCOUNTER — Ambulatory Visit
Admission: RE | Admit: 2023-01-22 | Discharge: 2023-01-22 | Disposition: A | Discharge status: Home / Self Care | Payer: No Typology Code available for payment source | Attending: Internal Medicine | Admitting: Internal Medicine

## 2023-01-22 ENCOUNTER — Encounter: Payer: Self-pay | Admitting: Internal Medicine

## 2023-01-22 ENCOUNTER — Ambulatory Visit (INDEPENDENT_AMBULATORY_CARE_PROVIDER_SITE_OTHER): Payer: 59 | Admitting: Internal Medicine

## 2023-01-22 VITALS — BP 132/78 | HR 88 | Ht 68.0 in | Wt 172.0 lb

## 2023-01-22 DIAGNOSIS — I10 Essential (primary) hypertension: Secondary | ICD-10-CM | POA: Diagnosis not present

## 2023-01-22 DIAGNOSIS — J4540 Moderate persistent asthma, uncomplicated: Secondary | ICD-10-CM | POA: Insufficient documentation

## 2023-01-22 DIAGNOSIS — R0602 Shortness of breath: Secondary | ICD-10-CM | POA: Diagnosis not present

## 2023-01-22 DIAGNOSIS — J45909 Unspecified asthma, uncomplicated: Secondary | ICD-10-CM | POA: Diagnosis not present

## 2023-01-22 DIAGNOSIS — E782 Mixed hyperlipidemia: Secondary | ICD-10-CM | POA: Diagnosis not present

## 2023-01-22 DIAGNOSIS — R7302 Impaired glucose tolerance (oral): Secondary | ICD-10-CM

## 2023-01-22 NOTE — Progress Notes (Signed)
Established Patient Office Visit  Subjective:  Patient ID: Judy Padilla, female    DOB: [birth year]  Age: 57 y.o. MRN: [id number]  Chief Complaint  Patient presents with   Follow-up    4 month follow up    Patient comes in for follow-up today.  Recently she has been suffering from exacerbations of her asthma.  She is already under care of ENT/allergist.  But with frequent exacerbations of asthma she has been consulting a pulmonologist who has changed her inhaler.  However last week she had to go to Urgent Care because of acute exacerbation and was prescribed on oral steroids as well as an antibiotic.  Although patient feels a little better but still has some chest congestion.  Will check a chest x-ray today.  Patient is fasting for labs.  Advised to continue her current medications and inhalers until seen by her pulmonologist.    No other concerns at this time.   Past Medical History:  Diagnosis Date   Asthma    well controlled   Breast cancer ([place])    Family history of adverse reaction to anesthesia    sister-hard time waking up   Family history of breast cancer    Family history of prostate cancer    Family history of uterine cancer    GERD (gastroesophageal reflux disease)    Hypertension    Personal history of chemotherapy     Past Surgical History:  Procedure Laterality Date   ABLATION     BREAST BIOPSY Left 2011   Benign per pt   BREAST BIOPSY Left 12/12/2020   3:30 3 cmfn, Q marker, pos   BREAST BIOPSY Left 12/12/2020   3:30 1 cmfn, Vision marker, pos   BREAST RECONSTRUCTION WITH PLACEMENT OF TISSUE EXPANDER AND FLEX HD (ACELLULAR HYDRATED DERMIS) Left 02/10/2021   Procedure: IMMEDIATE LEFT BREAST RECONSTRUCTION WITH PLACEMENT OF TISSUE EXPANDER AND FLEX HD (ACELLULAR HYDRATED DERMIS);  Surgeon: Wallace Going, DO;  Location: [facility];  Service: Plastics;  Laterality: Left;   COLONOSCOPY  01/15/2020   MASTECTOMY Left 2022   PORTACATH PLACEMENT Right  02/10/2021   Procedure: INSERTION PORT-A-CATH;  Surgeon: Ronny Bacon, MD;  Location: [facility];  Service: General;  Laterality: Right;   REMOVAL OF TISSUE EXPANDER AND PLACEMENT OF IMPLANT Left 07/09/2021   Procedure: REMOVAL OF TISSUE EXPANDER AND PLACEMENT OF IMPLANT LEFT BREAST;  Surgeon: Wallace Going, DO;  Location: [place];  Service: Plastics;  Laterality: Left;   SIMPLE MASTECTOMY WITH AXILLARY SENTINEL NODE BIOPSY Left 02/10/2021   Procedure: SIMPLE MASTECTOMY WITH AXILLARY SENTINEL NODE BIOPSY;  Surgeon: Ronny Bacon, MD;  Location: [facility];  Service: General;  Laterality: Left;    Social History   Socioeconomic History   Marital status: Married    Spouse name: Not on file   Number of children: Not on file   Years of education: Not on file   Highest education level: Not on file  Occupational History   Not on file  Tobacco Use   Smoking status: Never   Smokeless tobacco: Never  Vaping Use   Vaping Use: Never used  Substance and Sexual Activity   Alcohol use: Not Currently   Drug use: Never   Sexual activity: Not on file  Other Topics Concern   Not on file  Social History Narrative   Lives in [place] with husband; kids- college. Works for [employer]- working from home. No smoking or alcohol.    Social  Determinants of Health   Financial Resource Strain: Not on file  Food Insecurity: Not on file  Transportation Needs: Not on file  Physical Activity: Not on file  Stress: Not on file  Social Connections: Not on file  Intimate Partner Violence: Not on file    Family History  Problem Relation Age of Onset   Hypertension Mother    Diabetes Mother    Hypertension Father    Diabetes Father    Cancer Father        prostate cancer-70s   Cancer Maternal Grandmother        uterine cancer   Breast cancer Sister        in in 72s.     Allergies  Allergen Reactions   Shrimp Extract Shortness Of Breath   Other  Swelling    Cannot take Excedrin     Review of Systems  Constitutional: Negative.   HENT:  Positive for congestion. Negative for ear discharge, ear pain, sinus pain and sore throat.   Eyes: Negative.   Respiratory:  Positive for cough and shortness of breath. Negative for wheezing.   Cardiovascular: Negative.   Gastrointestinal:  Negative for abdominal pain, diarrhea, heartburn, nausea and vomiting.  Genitourinary: Negative.   Musculoskeletal: Negative.   Skin: Negative.   Neurological: Negative.   Psychiatric/Behavioral: Negative.         Objective:   BP 132/78   Pulse 88   Ht 5\' 8"  (1.727 m)   Wt 172 lb (78 kg)   LMP 01/04/2018   SpO2 98%   BMI 26.15 kg/m   Vitals:   01/22/23 0920  BP: 132/78  Pulse: 88  Height: 5\' 8"  (1.727 m)  Weight: 172 lb (78 kg)  SpO2: 98%  BMI (Calculated): 26.16    Physical Exam Vitals and nursing note reviewed.  Constitutional:      Appearance: Normal appearance.  HENT:     Head: Normocephalic.  Cardiovascular:     Rate and Rhythm: Normal rate and regular rhythm.  Pulmonary:     Effort: Pulmonary effort is normal.     Breath sounds: Normal breath sounds. No wheezing, rhonchi or rales.  Abdominal:     General: Abdomen is flat. Bowel sounds are normal.     Palpations: Abdomen is soft.  Musculoskeletal:        General: Normal range of motion.     Cervical back: Normal range of motion.  Skin:    General: Skin is warm.  Neurological:     General: No focal deficit present.     Mental Status: She is alert.  Psychiatric:        Mood and Affect: Mood normal.        Behavior: Behavior normal.      No results found for any visits on 01/22/23.  Recent Results (from the past 2160 hour(s))  Comprehensive metabolic panel     Status: Abnormal   Collection Time: 01/04/23  9:09 AM  Result Value Ref Range   Sodium 139 135 - 145 mmol/L   Potassium 3.9 3.5 - 5.1 mmol/L   Chloride 106 98 - 111 mmol/L   CO2 22 22 - 32 mmol/L    Glucose, Bld 102 (H) 70 - 99 mg/dL    Comment: Glucose reference range applies only to samples taken after fasting for at least 8 hours.   BUN 16 6 - 20 mg/dL   Creatinine, Ser 0.80 0.44 - 1.00 mg/dL   Calcium 9.1 8.9 - 10.3  mg/dL   Total Protein 7.1 6.5 - 8.1 g/dL   Albumin 4.4 3.5 - 5.0 g/dL   AST 22 15 - 41 U/L   ALT 24 0 - 44 U/L   Alkaline Phosphatase 81 38 - 126 U/L   Total Bilirubin 0.7 0.3 - 1.2 mg/dL   GFR, Estimated >60 >60 mL/min    Comment: (NOTE) Calculated using the CKD-EPI Creatinine Equation (2021)    Anion gap 11 5 - 15    Comment: Performed at [facility], [address] [place], [place] [address]  CBC with Differential/Platelet     Status: Abnormal   Collection Time: 01/04/23  9:09 AM  Result Value Ref Range   WBC 4.3 4.0 - 10.5 K/uL   RBC 5.02 3.87 - 5.11 MIL/uL   Hemoglobin 13.3 12.0 - 15.0 g/dL   HCT 41.1 36.0 - 46.0 %   MCV 81.9 80.0 - 100.0 fL   MCH 26.5 26.0 - 34.0 pg   MCHC 32.4 30.0 - 36.0 g/dL   RDW 14.3 11.5 - 15.5 %   Platelets 240 150 - 400 K/uL   nRBC 0.0 0.0 - 0.2 %   Neutrophils Relative % 35 %   Neutro Abs 1.5 (L) 1.7 - 7.7 K/uL   Lymphocytes Relative 40 %   Lymphs Abs 1.8 0.7 - 4.0 K/uL   Monocytes Relative 10 %   Monocytes Absolute 0.4 0.1 - 1.0 K/uL   Eosinophils Relative 14 %   Eosinophils Absolute 0.6 (H) 0.0 - 0.5 K/uL   Basophils Relative 1 %   Basophils Absolute 0.0 0.0 - 0.1 K/uL   Immature Granulocytes 0 %   Abs Immature Granulocytes 0.01 0.00 - 0.07 K/uL    Comment: Performed at [facility], [address], [place], [place] [address]      Assessment & Plan:  Continue meds. Labs today. Problem List Items Addressed This Visit     Moderate persistent asthma without complication - Primary   Relevant Orders   DG Chest 2 View (Completed)   Impaired glucose tolerance   Relevant Orders   Hemoglobin A1c   Essential hypertension, benign   Relevant Orders   CMP14+EGFR   Mixed hyperlipidemia   Relevant  Orders   Lipid Panel w/o Chol/HDL Ratio    Return in about 4 months (around 05/24/2023).   Total time spent: 30 minutes  [name], MD  01/22/2023

## 2023-01-23 ENCOUNTER — Encounter: Payer: Self-pay | Admitting: Internal Medicine

## 2023-01-23 LAB — CMP14+EGFR
ALT: 29 IU/L (ref 0–32)
AST: 19 IU/L (ref 0–40)
Albumin/Globulin Ratio: 2.3 — ABNORMAL HIGH (ref 1.2–2.2)
Albumin: 4.5 g/dL (ref 3.8–4.9)
Alkaline Phosphatase: 92 IU/L (ref 44–121)
BUN/Creatinine Ratio: 20 (ref 9–23)
BUN: 18 mg/dL (ref 6–24)
Bilirubin Total: 0.4 mg/dL (ref 0.0–1.2)
CO2: 24 mmol/L (ref 20–29)
Calcium: 9.7 mg/dL (ref 8.7–10.2)
Chloride: 104 mmol/L (ref 96–106)
Creatinine, Ser: 0.91 mg/dL (ref 0.57–1.00)
Globulin, Total: 2 g/dL (ref 1.5–4.5)
Glucose: 86 mg/dL (ref 70–99)
Potassium: 5.1 mmol/L (ref 3.5–5.2)
Sodium: 142 mmol/L (ref 134–144)
Total Protein: 6.5 g/dL (ref 6.0–8.5)
eGFR: 73 mL/min/{1.73_m2} (ref >59)

## 2023-01-23 LAB — LIPID PANEL W/O CHOL/HDL RATIO
Cholesterol, Total: 182 mg/dL (ref 100–199)
HDL: 67 mg/dL (ref >39)
LDL Chol Calc (NIH): 97 mg/dL (ref 0–99)
Triglycerides: 103 mg/dL (ref 0–149)
VLDL Cholesterol Cal: 18 mg/dL (ref 5–40)

## 2023-01-23 LAB — HEMOGLOBIN A1C
Est. average glucose Bld gHb Est-mCnc: 128 mg/dL
Hgb A1c MFr Bld: 6.1 % — ABNORMAL HIGH (ref 4.8–5.6)

## 2023-01-28 DIAGNOSIS — J301 Allergic rhinitis due to pollen: Secondary | ICD-10-CM | POA: Diagnosis not present

## 2023-02-04 DIAGNOSIS — J301 Allergic rhinitis due to pollen: Secondary | ICD-10-CM | POA: Diagnosis not present

## 2023-02-04 DIAGNOSIS — J4541 Moderate persistent asthma with (acute) exacerbation: Secondary | ICD-10-CM | POA: Diagnosis not present

## 2023-02-06 DIAGNOSIS — M1711 Unilateral primary osteoarthritis, right knee: Secondary | ICD-10-CM | POA: Diagnosis not present

## 2023-02-07 ENCOUNTER — Other Ambulatory Visit: Payer: Self-pay | Admitting: Internal Medicine

## 2023-02-11 DIAGNOSIS — J301 Allergic rhinitis due to pollen: Secondary | ICD-10-CM | POA: Diagnosis not present

## 2023-02-18 DIAGNOSIS — J4540 Moderate persistent asthma, uncomplicated: Secondary | ICD-10-CM | POA: Diagnosis not present

## 2023-02-18 DIAGNOSIS — J301 Allergic rhinitis due to pollen: Secondary | ICD-10-CM | POA: Diagnosis not present

## 2023-02-19 MED ORDER — FAMOTIDINE 20 MG PO TABS: 20.0000 mg | ORAL_TABLET | Freq: Every day | ORAL | 1 refills | Status: DC

## 2023-02-19 NOTE — Addendum Note (Signed)
Addended by: Coury Grieger on: 02/19/2023 02:07 PM   Modules accepted: Orders  

## 2023-03-02 ENCOUNTER — Telehealth: Payer: Self-pay

## 2023-03-02 NOTE — Telephone Encounter (Signed)
Pt called and left vm regarding that she is traveling out of the country soon, she asked if you can send rx for Typhoid & for Malaria please advise

## 2023-03-03 NOTE — Telephone Encounter (Signed)
Lm for pt to call back

## 2023-03-11 ENCOUNTER — Other Ambulatory Visit: Payer: Self-pay | Admitting: Internal Medicine

## 2023-03-11 ENCOUNTER — Telehealth: Payer: Self-pay

## 2023-03-11 DIAGNOSIS — J301 Allergic rhinitis due to pollen: Secondary | ICD-10-CM | POA: Diagnosis not present

## 2023-03-11 MED ORDER — MEFLOQUINE HCL 250 MG PO TABS: ORAL_TABLET | ORAL | 1 refills | Status: DC

## 2023-03-11 MED ORDER — TYPHOID VACCINE PO CPDR
1.0000 | DELAYED_RELEASE_CAPSULE | ORAL | 0 refills | Status: DC
Start: 2023-03-11 — End: 2023-05-06

## 2023-03-11 NOTE — Telephone Encounter (Signed)
Pt came by regarding needing rx's for typhoid & malaria whenever she leaves to go out of the country. Malaria for preventative & oral for typhoid, she asked if you can send these rx's for her? Please advise, she also asked if you can put in order for her to get A1c checked tomorrow, please advise

## 2023-03-15 ENCOUNTER — Other Ambulatory Visit: Payer: Self-pay | Admitting: Internal Medicine

## 2023-03-18 ENCOUNTER — Telehealth: Payer: Self-pay | Admitting: Internal Medicine

## 2023-03-18 DIAGNOSIS — J301 Allergic rhinitis due to pollen: Secondary | ICD-10-CM | POA: Diagnosis not present

## 2023-03-18 MED ORDER — MEFLOQUINE HCL 250 MG PO TABS: ORAL_TABLET | ORAL | 1 refills | Status: DC

## 2023-03-18 NOTE — Telephone Encounter (Signed)
Patient called stating that the typhoid and Lariam Rx's were not received by the pharmacy. I see that typhoid was received by pharmacy but Lariam was accidentally printed. I resent the Lariam electronically and called [facility] to confirm that they received the typhoid Rx. The typhoid is not covered by insurance and will be $128. Patient notified.

## 2023-03-23 DIAGNOSIS — J301 Allergic rhinitis due to pollen: Secondary | ICD-10-CM | POA: Diagnosis not present

## 2023-03-25 DIAGNOSIS — J301 Allergic rhinitis due to pollen: Secondary | ICD-10-CM | POA: Diagnosis not present

## 2023-04-01 DIAGNOSIS — J301 Allergic rhinitis due to pollen: Secondary | ICD-10-CM | POA: Diagnosis not present

## 2023-04-12 ENCOUNTER — Ambulatory Visit
Admission: RE | Admit: 2023-04-12 | Discharge: 2023-04-12 | Disposition: A | Discharge status: Home / Self Care | Payer: No Typology Code available for payment source | Source: Ambulatory Visit | Attending: Internal Medicine | Admitting: Internal Medicine

## 2023-04-12 DIAGNOSIS — C50512 Malignant neoplasm of lower-outer quadrant of left female breast: Secondary | ICD-10-CM | POA: Diagnosis not present

## 2023-04-12 DIAGNOSIS — Z170 Estrogen receptor positive status [ER+]: Secondary | ICD-10-CM

## 2023-04-12 DIAGNOSIS — Z780 Asymptomatic menopausal state: Secondary | ICD-10-CM | POA: Diagnosis not present

## 2023-04-12 DIAGNOSIS — Z1231 Encounter for screening mammogram for malignant neoplasm of breast: Secondary | ICD-10-CM | POA: Diagnosis not present

## 2023-04-15 DIAGNOSIS — J301 Allergic rhinitis due to pollen: Secondary | ICD-10-CM | POA: Diagnosis not present

## 2023-04-29 ENCOUNTER — Encounter: Payer: Self-pay | Admitting: Internal Medicine

## 2023-04-29 DIAGNOSIS — J301 Allergic rhinitis due to pollen: Secondary | ICD-10-CM | POA: Diagnosis not present

## 2023-05-04 ENCOUNTER — Other Ambulatory Visit: Payer: Self-pay | Admitting: Internal Medicine

## 2023-05-04 DIAGNOSIS — Z2989 Encounter for other specified prophylactic measures: Secondary | ICD-10-CM

## 2023-05-04 MED ORDER — ATOVAQUONE-PROGUANIL HCL 250-100 MG PO TABS: ORAL_TABLET | ORAL | 0 refills | Status: DC

## 2023-05-06 ENCOUNTER — Encounter: Payer: Self-pay | Admitting: Internal Medicine

## 2023-05-06 ENCOUNTER — Inpatient Hospital Stay (HOSPITAL_BASED_OUTPATIENT_CLINIC_OR_DEPARTMENT_OTHER): Payer: 59 | Admitting: Internal Medicine

## 2023-05-06 ENCOUNTER — Inpatient Hospital Stay: Payer: 59 | Attending: Internal Medicine

## 2023-05-06 DIAGNOSIS — C50512 Malignant neoplasm of lower-outer quadrant of left female breast: Secondary | ICD-10-CM | POA: Insufficient documentation

## 2023-05-06 DIAGNOSIS — Z79811 Long term (current) use of aromatase inhibitors: Secondary | ICD-10-CM | POA: Insufficient documentation

## 2023-05-06 DIAGNOSIS — J301 Allergic rhinitis due to pollen: Secondary | ICD-10-CM | POA: Diagnosis not present

## 2023-05-06 DIAGNOSIS — Z170 Estrogen receptor positive status [ER+]: Secondary | ICD-10-CM | POA: Insufficient documentation

## 2023-05-06 LAB — CBC WITH DIFFERENTIAL (CANCER CENTER ONLY)
Abs Immature Granulocytes: 0.03 10*3/uL (ref 0.00–0.07)
Basophils Absolute: 0 10*3/uL (ref 0.0–0.1)
Basophils Relative: 1 %
Eosinophils Absolute: 0.2 10*3/uL (ref 0.0–0.5)
Eosinophils Relative: 4 %
HCT: 40.8 % (ref 36.0–46.0)
Hemoglobin: 13.2 g/dL (ref 12.0–15.0)
Immature Granulocytes: 1 %
Lymphocytes Relative: 36 %
Lymphs Abs: 1.6 10*3/uL (ref 0.7–4.0)
MCH: 27 pg (ref 26.0–34.0)
MCHC: 32.4 g/dL (ref 30.0–36.0)
MCV: 83.4 fL (ref 80.0–100.0)
Monocytes Absolute: 0.4 10*3/uL (ref 0.1–1.0)
Monocytes Relative: 10 %
Neutro Abs: 2.1 10*3/uL (ref 1.7–7.7)
Neutrophils Relative %: 48 %
Platelet Count: 281 10*3/uL (ref 150–400)
RBC: 4.89 MIL/uL (ref 3.87–5.11)
RDW: 13.9 % (ref 11.5–15.5)
WBC Count: 4.3 10*3/uL (ref 4.0–10.5)
nRBC: 0 % (ref 0.0–0.2)

## 2023-05-06 LAB — CMP (CANCER CENTER ONLY)
ALT: 23 U/L (ref 0–44)
AST: 19 U/L (ref 15–41)
Albumin: 4.4 g/dL (ref 3.5–5.0)
Alkaline Phosphatase: 74 U/L (ref 38–126)
Anion gap: 7 (ref 5–15)
BUN: 20 mg/dL (ref 6–20)
CO2: 25 mmol/L (ref 22–32)
Calcium: 9.2 mg/dL (ref 8.9–10.3)
Chloride: 108 mmol/L (ref 98–111)
Creatinine: 0.85 mg/dL (ref 0.44–1.00)
GFR, Estimated: >60 mL/min (ref >60)
Glucose, Bld: 96 mg/dL (ref 70–99)
Potassium: 4.1 mmol/L (ref 3.5–5.1)
Sodium: 140 mmol/L (ref 135–145)
Total Bilirubin: 0.5 mg/dL (ref 0.3–1.2)
Total Protein: 7 g/dL (ref 6.5–8.1)

## 2023-05-06 MED ORDER — EXEMESTANE 25 MG PO TABS: 25.0000 mg | ORAL_TABLET | Freq: Every day | ORAL | 1 refills | Status: DC

## 2023-05-06 NOTE — Progress Notes (Signed)
No concerns today 

## 2023-05-06 NOTE — Assessment & Plan Note (Addendum)
#   APRIL 2022- Left breast- CA- s/p mastectomy-  pT1c pN0 [Stage IA]Grade 3.  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  S/p  adjuvant Herceptin [april 2023]. Started anastrozole [Oct 2022]; STABLE;  s/p Adjuvant herceptin [finished April 2023]. JUNE 2024- Diagnostic Mammogram Right Unilateral- WNL.   # currently on aromasin-tolerating well.  Continue the same.  New refill sent.   # Musculoskeletal G-1- Emerge othro- ok with steroids injection.  monitor for now.   #Drop in ejection fraction- Asymptomatic. FEB 2024- left ventricular ejection fraction equals 55.7 % (comparable to 50.4 % on most recent MUGA scan)- stable  # 2022-BMD- 2024- T-score of -0.2. continue vit D.   # PIV:   # DISPOSITION:  #  follow up in 2nd week JAN 2025- MD; labs- cbc/cmp; vit D 25-OH level- -- Dr.B

## 2023-05-06 NOTE — Progress Notes (Signed)
[facility] CONSULT NOTE  Patient Care Team: [name], MD as PCP - General (Internal Medicine) [name], RN as Registered Nurse [name], MD as Consulting Physician (Internal Medicine) [name], MD as Consulting Physician (General Surgery) [name], [name], DO as Attending Physician (Plastic Surgery)  CHIEF COMPLAINTS/PURPOSE OF CONSULTATION: Breast cancer   Oncology History Overview Note  # April 2022-stage Ia mammary carcinoma ER/PR positive HER2 positive [TRIPLE positive]; negative margins s/p simple mastectomy with plan for immediate reconstruction [Dr.Rodenberg/[name]]; NO RT  # May 2nd, 2022- Taxol-Herceptin  # OCT 3rd, 2022- Anastrazole 1mg /day [AUG 2022- BMD- WNL];   # AUG 2023- DISCONTINUE  Anastrazole- sec to MSK side effects; start Aromasin.  # MUGA scan- 64% [03/06/2021]-   # LMP- mid 2020; Uterine ablation- [phone]; # HTN; Asthma- well controlled [on allergy shots];Marland Kitchen    Malignant neoplasm of lower-outer quadrant of left breast of female, estrogen receptor positive (HCC)  12/25/2020 Initial Diagnosis   Malignant neoplasm of lower-outer quadrant of left breast of female, estrogen receptor positive (HCC)   02/26/2021 Cancer Staging   Staging form: Breast, AJCC 8th Edition - Pathologic: Stage IA (pT1c, pN0, cM0, G3, ER+, PR+, HER2+) - Signed by [name], MD on 02/26/2021 Mitotic count score: Score 3 Histologic grading system: 3 grade system   03/10/2021 -  Chemotherapy   Patient is on Treatment Plan : BREAST Paclitaxel + Trastuzumab q7d / Trastuzumab q21d      Genetic Testing   Negative genetic testing. No pathogenic variants identified on the [facility] Multi-Cancer+RNA panel. 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 The report date is 04/29/2021.  The Multi-Cancer Panel + RNA offered by [facility] includes sequencing and/or deletion duplication testing of the following 84 genes: AIP, ALK, APC, ATM,  AXIN2,BAP1,  BARD1, BLM, BMPR1A, BRCA1, BRCA2, BRIP1, CASR, CDC73, CDH1, CDK4, CDKN1B, CDKN1C, CDKN2A (p14ARF), CDKN2A (p16INK4a), CEBPA, CHEK2, CTNNA1, DICER1, DIS3L2, EGFR (c.2369C>T, p.Thr790Met variant only), EPCAM (Deletion/duplication testing only), FH, FLCN, GATA2, GPC3, GREM1 (Promoter region deletion/duplication testing only), HOXB13 (c.251G>A, p.Gly84Glu), HRAS, KIT, MAX, MEN1, MET, MITF (c.952G>A, p.Glu318Lys variant only), MLH1, MSH2, MSH3, MSH6, MUTYH, NBN, NF1, NF2, NTHL1, PALB2, PDGFRA, PHOX2B, PMS2, POLD1, POLE, POT1, PRKAR1A, PTCH1, PTEN, RAD50, RAD51C, RAD51D, RB1, RECQL4, RET, RUNX1, SDHAF2, SDHA (sequence changes only), SDHB, SDHC, SDHD, SMAD4, SMARCA4, SMARCB1, SMARCE1, STK11, SUFU, TERC, TERT, TMEM127, TP53, TSC1, TSC2, VHL, WRN and WT1.    HISTORY OF PRESENTING ILLNESS: Alone.  Ambulating independently.  Judy Padilla 56 y.o.  female newly diagnosed breast cancer stage I ER/PR positive HER2/neu positive s/p adjuvant Herceptin; currently on aromasin is here for follow-up/review results of bone density and mammogram.  Notes her improvement of her joint pains. Not worsening of joint pain.  No worsening hot flashes.  No constipation or diarrhea.  Review of Systems  Constitutional:  Negative for chills, diaphoresis, fever, malaise/fatigue and weight loss.  HENT:  Negative for nosebleeds and sore throat.   Eyes:  Negative for double vision.  Respiratory:  Negative for cough, hemoptysis, sputum production, shortness of breath and wheezing.   Cardiovascular:  Negative for chest pain, palpitations, orthopnea and leg swelling.  Gastrointestinal:  Negative for abdominal pain, blood in stool, constipation, diarrhea, heartburn, melena, nausea and vomiting.  Genitourinary:  Negative for dysuria, frequency and urgency.  Musculoskeletal:  Negative for back pain and joint pain.  Skin: Negative.  Negative for itching and rash.  Neurological:  Negative for dizziness, tingling, focal  weakness, weakness and headaches.  Endo/Heme/Allergies:  Does not bruise/bleed easily.  Psychiatric/Behavioral:  Negative for depression. The patient is not nervous/anxious and does not have insomnia.      MEDICAL HISTORY:  Past Medical History:  Diagnosis Date   Asthma    well controlled   Breast cancer (HCC)    Family history of adverse reaction to anesthesia    sister-hard time waking up   Family history of breast cancer    Family history of prostate cancer    Family history of uterine cancer    GERD (gastroesophageal reflux disease)    Hypertension    Personal history of chemotherapy     SURGICAL HISTORY: Past Surgical History:  Procedure Laterality Date   ABLATION     BREAST BIOPSY Left 2011   Benign per pt   BREAST BIOPSY Left 12/12/2020   3:30 3 cmfn, Q marker, pos   BREAST BIOPSY Left 12/12/2020   3:30 1 cmfn, Vision marker, pos   BREAST RECONSTRUCTION WITH PLACEMENT OF TISSUE EXPANDER AND FLEX HD (ACELLULAR HYDRATED DERMIS) Left 02/10/2021   Procedure: IMMEDIATE LEFT BREAST RECONSTRUCTION WITH PLACEMENT OF TISSUE EXPANDER AND FLEX HD (ACELLULAR HYDRATED DERMIS);  Surgeon: Peggye Form, DO;  Location: [facility];  Service: Plastics;  Laterality: Left;   COLONOSCOPY  01/15/2020   MASTECTOMY Left 2022   PORTACATH PLACEMENT Right 02/10/2021   Procedure: INSERTION PORT-A-CATH;  Surgeon: [name], MD;  Location: [facility];  Service: General;  Laterality: Right;   REMOVAL OF TISSUE EXPANDER AND PLACEMENT OF IMPLANT Left 07/09/2021   Procedure: REMOVAL OF TISSUE EXPANDER AND PLACEMENT OF IMPLANT LEFT BREAST;  Surgeon: Peggye Form, DO;  Location: [facility];  Service: Plastics;  Laterality: Left;   SIMPLE MASTECTOMY WITH AXILLARY SENTINEL NODE BIOPSY Left 02/10/2021   Procedure: SIMPLE MASTECTOMY WITH AXILLARY SENTINEL NODE BIOPSY;  Surgeon: [name], MD;  Location: [facility];  Service: General;  Laterality: Left;    SOCIAL  HISTORY: Social History   Socioeconomic History   Marital status: Married    Spouse name: Not on file   Number of children: Not on file   Years of education: Not on file   Highest education level: Not on file  Occupational History   Not on file  Tobacco Use   Smoking status: Never   Smokeless tobacco: Never  Vaping Use   Vaping Use: Never used  Substance and Sexual Activity   Alcohol use: Not Currently   Drug use: Never   Sexual activity: Not on file  Other Topics Concern   Not on file  Social History Narrative   Lives in [place] with husband; kids- college. Works for [employer]- working from home. No smoking or alcohol.    Social Determinants of Health   Financial Resource Strain: Not on file  Food Insecurity: Not on file  Transportation Needs: Not on file  Physical Activity: Not on file  Stress: Not on file  Social Connections: Not on file  Intimate Partner Violence: Not on file    FAMILY HISTORY: Family History  Problem Relation Age of Onset   Hypertension Mother    Diabetes Mother    Hypertension Father    Diabetes Father    Cancer Father        prostate cancer-70s   Cancer Maternal Grandmother        uterine cancer   Breast cancer Sister        in in 11s.     ALLERGIES:  is allergic to shrimp extract  and other.  MEDICATIONS:  Current Outpatient Medications  Medication Sig Dispense Refill   AIRSUPRA 90-80 MCG/ACT AERO Inhale 2 puffs into the lungs daily.     albuterol (VENTOLIN HFA) 108 (90 Base) MCG/ACT inhaler Inhale 1 puff into the lungs every 6 (six) hours as needed for shortness of breath.     amLODipine (NORVASC) 10 MG tablet Take 10 mg by mouth every morning.     atovaquone-proguanil (MALARONE) 250-100 MG TABS tablet Take 1 tablet daily . Start 2 days before travel, continue during the trip, and then 7 days after return. 30 tablet 0   budesonide (PULMICORT) 0.5 MG/2ML nebulizer solution Take 2 mLs by nebulization daily.      Cholecalciferol 1.25 MG (50000 UT) capsule Take 50,000 Units by mouth once a week.     EPINEPHrine 0.3 mg/0.3 mL IJ SOAJ injection Inject 0.3 mg into the muscle as needed for anaphylaxis.     NON FORMULARY Takes weekly allergy shots     pantoprazole (PROTONIX) 40 MG tablet Take 40 mg by mouth every morning.     rosuvastatin (CRESTOR) 40 MG tablet Take 40 mg by mouth daily.     cetirizine (ZYRTEC) 10 MG tablet Take 10 mg by mouth daily as needed for allergies. (Patient not taking: Reported on 05/06/2023)     DUPIXENT 300 MG/2ML SOPN Inject into the skin.     exemestane (AROMASIN) 25 MG tablet Take 1 tablet (25 mg total) by mouth daily after breakfast. 90 tablet 1   famotidine (PEPCID) 20 MG tablet Take 1 tablet (20 mg total) by mouth daily. 90 tablet 1   fluticasone (FLONASE) 50 MCG/ACT nasal spray Place 1 spray into both nostrils daily as needed for allergies. (Patient not taking: Reported on 05/06/2023)     GNP BUDESONIDE NASAL SPRAY NA Place 1 spray into the nose daily. (Patient not taking: Reported on 05/06/2023)     Multiple Vitamins-Minerals (CENTRUM ADULTS) TABS Take 1 tablet by mouth daily. (Patient not taking: Reported on 05/06/2023)     No current facility-administered medications for this visit.      Marland Kitchen  PHYSICAL EXAMINATION: ECOG PERFORMANCE STATUS: 0 - Asymptomatic  Vitals:   05/06/23 1033  BP: 129/87  Pulse: 69  Temp: (!) 97.5 F (36.4 C)  SpO2: 100%   Filed Weights   05/06/23 1033  Weight: 173 lb 3.2 oz (78.6 kg)    Physical Exam HENT:     Head: Normocephalic and atraumatic.     Mouth/Throat:     Pharynx: No oropharyngeal exudate.  Eyes:     Pupils: Pupils are equal, round, and reactive to light.  Cardiovascular:     Rate and Rhythm: Normal rate and regular rhythm.  Pulmonary:     Effort: Pulmonary effort is normal. No respiratory distress.     Breath sounds: Normal breath sounds. No wheezing.  Abdominal:     General: Bowel sounds are normal. There is no  distension.     Palpations: Abdomen is soft. There is no mass.     Tenderness: There is no abdominal tenderness. There is no guarding or rebound.  Musculoskeletal:        General: No tenderness. Normal range of motion.     Cervical back: Normal range of motion and neck supple.  Skin:    General: Skin is warm.  Neurological:     Mental Status: She is alert and oriented to person, place, and time.  Psychiatric:        Mood  and Affect: Affect normal.      LABORATORY DATA:  I have reviewed the data as listed Lab Results  Component Value Date   WBC 4.3 05/06/2023   HGB 13.2 05/06/2023   HCT 40.8 05/06/2023   MCV 83.4 05/06/2023   PLT 281 05/06/2023   Recent Labs    01/04/23 0909 01/22/23 1052 05/06/23 1041  NA 139 142 140  K 3.9 5.1 4.1  CL 106 104 108  CO2 22 24 25   GLUCOSE 102* 86 96  BUN 16 18 20   CREATININE 0.80 0.91 0.85  CALCIUM 9.1 9.7 9.2  GFRNONAA >60  --  >60  PROT 7.1 6.5 7.0  ALBUMIN 4.4 4.5 4.4  AST 22 19 19   ALT 24 29 23   ALKPHOS 81 92 74  BILITOT 0.7 0.4 0.5    RADIOGRAPHIC STUDIES: I have personally reviewed the radiological images as listed and agreed with the findings in the report. MM 3D SCREEN BREAST UNI RIGHT  Result Date: 04/13/2023 CLINICAL DATA:  Screening. Status post left mastectomy 2022 EXAM: DIGITAL SCREENING UNILATERAL RIGHT MAMMOGRAM WITH CAD AND TOMOSYNTHESIS TECHNIQUE: Right screening digital craniocaudal and mediolateral oblique mammograms were obtained. Right screening digital breast tomosynthesis was performed. The images were evaluated with computer-aided detection. COMPARISON:  Previous exam(s). ACR Breast Density Category c: The breasts are heterogeneously dense, which may obscure small masses. FINDINGS: The patient has had a left mastectomy. There are no findings suspicious for malignancy. IMPRESSION: No mammographic evidence of malignancy. A result letter of this screening mammogram will be mailed directly to the patient.  RECOMMENDATION: Screening mammogram in one year.  (Code:SM-R-68M) BI-RADS CATEGORY  1: Negative. Electronically Signed   By: Jacob Moores M.D.   On: 04/13/2023 10:18   DG Bone Density  Result Date: 04/12/2023 EXAM: DUAL X-RAY ABSORPTIOMETRY (DXA) FOR BONE MINERAL DENSITY IMPRESSION: Your patient Judy Padilla completed a BMD test on 04/12/2023 using the [employer] DXA System (software version: 14.10) manufactured by [employer]. The following summarizes the results of our evaluation. Technologist: [facility] PATIENT BIOGRAPHICAL: Name: Janitza, Revuelta Patient ID: [id number] Birth Date: [birth year] Height: 68.0 in. Gender: Female Exam Date: 04/12/2023 Weight: 174.6 lbs. Indications: Postmenopausal, History of Breast Cancer, History of Chemo Fractures: Foot Treatments: Albuterol, Aromasin, calcium w/ vit D, Vitamin D DENSITOMETRY RESULTS: Site      Region     Measured Date Measured Age WHO Classification Young Adult T-score BMD         %Change vs. Previous Significant Change (*) AP Spine L1-L4 2/12/2021 58.8 Normal 1.4 1.365 g/cm2 -7.0% Yes AP Spine L1-L4 06[age]30/2021 57.0 Normal 2.2 1.468 g/cm2 - - DualFemur Neck Right 04/12/2023 58.8 Normal -0.2 1.015 g/cm2 -6.2% Yes DualFemur Neck Right 06/30/2021 57.0 Normal 0.3 1.082 g/cm2 - - DualFemur Total Mean 2/12/2023 58.8 Normal 0.5 1.069 g/cm2 -5.8% Yes DualFemur Total Mean 06/30/2021 57.0 Normal 1.0 1.135 g/cm2 - - ASSESSMENT: The BMD measured at Femur Neck Right is 1.015 g/cm2 with a T-score of -0.2. This patient is considered normal according to World Health Organization [facility]) criteria. The scan quality is good. Compared with prior study, there has been significant decrease in the spine. Compared with prior study, there has been significant decrease in the total hip. World Health Organization [facility]) criteria for post-menopausal, Caucasian Women: Normal:                   T-score at or above -1 SD Osteopenia/low bone mass: T-score between -1 and -2.5 SD  Osteoporosis:             T-score at or below -2.5 SD RECOMMENDATIONS: 1. All patients should optimize calcium and vitamin D intake. 2. Consider FDA-approved medical therapies in postmenopausal women and men aged 42 years and older, based on the following: a. A hip or vertebral(clinical or morphometric) fracture b. T-score < -2.5 at the femoral neck or spine after appropriate evaluation to exclude secondary causes c. Low bone mass (T-score between -1.0 and -2.5 at the femoral neck or spine) and a 10-year probability of a hip fracture > 3% or a 10-year probability of a major osteoporosis-related fracture > 20% based on the [place]-adapted WHO algorithm 3. Clinician judgment and/or patient preferences may indicate treatment for people with 10-year fracture probabilities above or below these levels FOLLOW-UP: People with diagnosed cases of osteoporosis or at high risk for fracture should have regular bone mineral density tests. For patients eligible for Medicare, routine testing is allowed once every 2 years. The testing frequency can be increased to one year for patients who have rapidly progressing disease, those who are receiving or discontinuing medical therapy to restore bone mass, or have additional risk factors. I have reviewed this report, and agree with the above findings. [facility] Electronically Signed   By: Gerome Sam III M.D.   On: 04/12/2023 10:01    ASSESSMENT & PLAN:   Malignant neoplasm of lower-outer quadrant of left breast of female, estrogen receptor positive (HCC) # APRIL 2022- Left breast- CA- s/p mastectomy-  pT1c pN0 [Stage IA]Grade 3.  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  S/p  adjuvant Herceptin [april 2023]. Started anastrozole [Oct 2022]; STABLE;  s/p Adjuvant herceptin [finished April 2023]. JUNE 2024- Diagnostic Mammogram Right Unilateral- WNL.   # currently on aromasin-tolerating well.  Continue the same.  New refill sent.   # Musculoskeletal G-1- Emerge othro- ok  with steroids injection.  monitor for now.   #Drop in ejection fraction- Asymptomatic. FEB 2024- left ventricular ejection fraction equals 55.7 % (comparable to 50.4 % on most recent MUGA scan)- stable  # 2022-BMD- 2024- T-score of -0.2. continue vit D.   # PIV:   # DISPOSITION:  #  follow up in 2nd week JAN 2025- MD; labs- cbc/cmp; vit D 25-OH level- -- Dr.B   All questions were answered. The patient/family knows to call the clinic with any problems, questions or concerns.    [name], MD 05/06/2023 12:23 PM

## 2023-05-07 ENCOUNTER — Ambulatory Visit: Payer: No Typology Code available for payment source | Admitting: Internal Medicine

## 2023-05-07 ENCOUNTER — Other Ambulatory Visit: Payer: No Typology Code available for payment source

## 2023-05-08 ENCOUNTER — Other Ambulatory Visit: Payer: Self-pay

## 2023-05-10 DIAGNOSIS — J4540 Moderate persistent asthma, uncomplicated: Secondary | ICD-10-CM | POA: Diagnosis not present

## 2023-05-13 ENCOUNTER — Other Ambulatory Visit: Payer: Self-pay

## 2023-05-17 DIAGNOSIS — Z0100 Encounter for examination of eyes and vision without abnormal findings: Secondary | ICD-10-CM | POA: Diagnosis not present

## 2023-05-17 DIAGNOSIS — H524 Presbyopia: Secondary | ICD-10-CM | POA: Diagnosis not present

## 2023-05-20 DIAGNOSIS — J301 Allergic rhinitis due to pollen: Secondary | ICD-10-CM | POA: Diagnosis not present

## 2023-05-24 ENCOUNTER — Ambulatory Visit (INDEPENDENT_AMBULATORY_CARE_PROVIDER_SITE_OTHER): Payer: 59 | Admitting: Internal Medicine

## 2023-05-24 ENCOUNTER — Encounter: Payer: Self-pay | Admitting: Internal Medicine

## 2023-05-24 VITALS — BP 116/84 | HR 82 | Ht 68.0 in | Wt 177.0 lb

## 2023-05-24 DIAGNOSIS — E782 Mixed hyperlipidemia: Secondary | ICD-10-CM

## 2023-05-24 DIAGNOSIS — Z170 Estrogen receptor positive status [ER+]: Secondary | ICD-10-CM

## 2023-05-24 DIAGNOSIS — I10 Essential (primary) hypertension: Secondary | ICD-10-CM

## 2023-05-24 DIAGNOSIS — J4540 Moderate persistent asthma, uncomplicated: Secondary | ICD-10-CM | POA: Diagnosis not present

## 2023-05-24 DIAGNOSIS — R7302 Impaired glucose tolerance (oral): Secondary | ICD-10-CM

## 2023-05-24 DIAGNOSIS — C50512 Malignant neoplasm of lower-outer quadrant of left female breast: Secondary | ICD-10-CM | POA: Diagnosis not present

## 2023-05-24 NOTE — Progress Notes (Signed)
Established Patient Office Visit  Subjective:  Patient ID: Judy Padilla, female    DOB: 1966/07/21  Age: 57 y.o. MRN: [id number]  Chief Complaint  Patient presents with   Follow-up    4 month follow up    Patient comes in for her follow-up today.  She is essentially feeling well.  She has been started on Dupixent injections by her pulmonologist and her asthma is under much better control now. She is preparing for her travels and has already taken her typhoid oral vaccine.  Will start her malaria prophylaxis this week. Patient had blood work done at her oncologist. Will check her lipid panel at her next follow-up.    No other concerns at this time.   Past Medical History:  Diagnosis Date   Asthma    well controlled   Breast cancer (HCC)    Family history of adverse reaction to anesthesia    sister-hard time waking up   Family history of breast cancer    Family history of prostate cancer    Family history of uterine cancer    GERD (gastroesophageal reflux disease)    Hypertension    Personal history of chemotherapy     Past Surgical History:  Procedure Laterality Date   ABLATION     BREAST BIOPSY Left 2011   Benign per pt   BREAST BIOPSY Left 12/12/2020   3:30 3 cmfn, Q marker, pos   BREAST BIOPSY Left 12/12/2020   3:30 1 cmfn, Vision marker, pos   BREAST RECONSTRUCTION WITH PLACEMENT OF TISSUE EXPANDER AND FLEX HD (ACELLULAR HYDRATED DERMIS) Left 02/10/2021   Procedure: IMMEDIATE LEFT BREAST RECONSTRUCTION WITH PLACEMENT OF TISSUE EXPANDER AND FLEX HD (ACELLULAR HYDRATED DERMIS);  Surgeon: Peggye Form, DO;  Location: [facility];  Service: Plastics;  Laterality: Left;   COLONOSCOPY  01/15/2020   MASTECTOMY Left 2022   PORTACATH PLACEMENT Right 02/10/2021   Procedure: INSERTION PORT-A-CATH;  Surgeon: [name], MD;  Location: [facility];  Service: General;  Laterality: Right;   REMOVAL OF TISSUE EXPANDER AND PLACEMENT OF IMPLANT Left 07/09/2021    Procedure: REMOVAL OF TISSUE EXPANDER AND PLACEMENT OF IMPLANT LEFT BREAST;  Surgeon: Peggye Form, DO;  Location: [facility];  Service: Plastics;  Laterality: Left;   SIMPLE MASTECTOMY WITH AXILLARY SENTINEL NODE BIOPSY Left 02/10/2021   Procedure: SIMPLE MASTECTOMY WITH AXILLARY SENTINEL NODE BIOPSY;  Surgeon: [name], MD;  Location: [facility];  Service: General;  Laterality: Left;    Social History   Socioeconomic History   Marital status: Married    Spouse name: Not on file   Number of children: Not on file   Years of education: Not on file   Highest education level: Not on file  Occupational History   Not on file  Tobacco Use   Smoking status: Never   Smokeless tobacco: Never  Vaping Use   Vaping status: Never Used  Substance and Sexual Activity   Alcohol use: Not Currently   Drug use: Never   Sexual activity: Not on file  Other Topics Concern   Not on file  Social History Narrative   Lives in [place] with husband; kids- college. Works for [employer]- working from home. No smoking or alcohol.    Social Determinants of Health   Financial Resource Strain: Not on file  Food Insecurity: Not on file  Transportation Needs: Not on file  Physical Activity: Not on file  Stress: Not on file  Social Connections:  Not on file  Intimate Partner Violence: Not on file    Family History  Problem Relation Age of Onset   Hypertension Mother    Diabetes Mother    Hypertension Father    Diabetes Father    Cancer Father        prostate cancer-70s   Cancer Maternal Grandmother        uterine cancer   Breast cancer Sister        in in 8s.     Allergies  Allergen Reactions   Shrimp Extract Shortness Of Breath   Other Swelling    Cannot take Excedrin     Review of Systems  Constitutional: Negative.   HENT: Negative.    Eyes: Negative.   Respiratory: Negative.  Negative for cough, shortness of breath and wheezing.    Cardiovascular: Negative.  Negative for chest pain, palpitations and leg swelling.  Gastrointestinal: Negative.  Negative for abdominal pain, constipation, diarrhea, heartburn, nausea and vomiting.  Genitourinary: Negative.  Negative for dysuria and flank pain.  Musculoskeletal: Negative.  Negative for joint pain and myalgias.  Skin: Negative.   Neurological: Negative.  Negative for dizziness and headaches.  Endo/Heme/Allergies: Negative.   Psychiatric/Behavioral: Negative.  Negative for depression and suicidal ideas. The patient is not nervous/anxious.        Objective:   BP 116/84   Pulse 82   Ht 5\' 8"  (1.727 m)   Wt 177 lb (80.3 kg)   LMP 01/04/2018   SpO2 97%   BMI 26.91 kg/m   Vitals:   05/24/23 0916  BP: 116/84  Pulse: 82  Height: 5\' 8"  (1.727 m)  Weight: 177 lb (80.3 kg)  SpO2: 97%  BMI (Calculated): 26.92    Physical Exam Vitals and nursing note reviewed.  Constitutional:      Appearance: Normal appearance.  HENT:     Head: Normocephalic and atraumatic.     Nose: Nose normal.     Mouth/Throat:     Mouth: Mucous membranes are moist.     Pharynx: Oropharynx is clear.  Eyes:     Conjunctiva/sclera: Conjunctivae normal.     Pupils: Pupils are equal, round, and reactive to light.  Cardiovascular:     Rate and Rhythm: Normal rate and regular rhythm.     Pulses: Normal pulses.     Heart sounds: Normal heart sounds. No murmur heard. Pulmonary:     Effort: Pulmonary effort is normal.     Breath sounds: Normal breath sounds. No wheezing, rhonchi or rales.  Abdominal:     General: Bowel sounds are normal.     Palpations: Abdomen is soft.     Tenderness: There is no abdominal tenderness. There is no right CVA tenderness or left CVA tenderness.  Musculoskeletal:        General: Normal range of motion.     Cervical back: Normal range of motion.     Right lower leg: No edema.     Left lower leg: No edema.  Skin:    General: Skin is warm and dry.   Neurological:     General: No focal deficit present.     Mental Status: She is alert and oriented to person, place, and time.  Psychiatric:        Mood and Affect: Mood normal.        Behavior: Behavior normal.      No results found for any visits on 05/24/23.  Recent Results (from the past 2160 hour(s))  CBC  with Differential (Cancer Center Only)     Status: None   Collection Time: 05/06/23 10:40 AM  Result Value Ref Range   WBC Count 4.3 4.0 - 10.5 K/uL   RBC 4.89 3.87 - 5.11 MIL/uL   Hemoglobin 13.2 12.0 - 15.0 g/dL   HCT 16.1 09.6 - 04.5 %   MCV 83.4 80.0 - 100.0 fL   MCH 27.0 26.0 - 34.0 pg   MCHC 32.4 30.0 - 36.0 g/dL   RDW 40.9 81.1 - 91.4 %   Platelet Count 281 150 - 400 K/uL   nRBC 0.0 0.0 - 0.2 %   Neutrophils Relative % 48 %   Neutro Abs 2.1 1.7 - 7.7 K/uL   Lymphocytes Relative 36 %   Lymphs Abs 1.6 0.7 - 4.0 K/uL   Monocytes Relative 10 %   Monocytes Absolute 0.4 0.1 - 1.0 K/uL   Eosinophils Relative 4 %   Eosinophils Absolute 0.2 0.0 - 0.5 K/uL   Basophils Relative 1 %   Basophils Absolute 0.0 0.0 - 0.1 K/uL   Immature Granulocytes 1 %   Abs Immature Granulocytes 0.03 0.00 - 0.07 K/uL    Comment: Performed at [facility], [address] [place], [place] [address]  CMP (Cancer Center only)     Status: None   Collection Time: 05/06/23 10:41 AM  Result Value Ref Range   Sodium 140 135 - 145 mmol/L   Potassium 4.1 3.5 - 5.1 mmol/L   Chloride 108 98 - 111 mmol/L   CO2 25 22 - 32 mmol/L   Glucose, Bld 96 70 - 99 mg/dL    Comment: Glucose reference range applies only to samples taken after fasting for at least 8 hours.   BUN 20 6 - 20 mg/dL   Creatinine 6.21 3.08 - 1.00 mg/dL   Calcium 9.2 8.9 - 65.7 mg/dL   Total Protein 7.0 6.5 - 8.1 g/dL   Albumin 4.4 3.5 - 5.0 g/dL   AST 19 15 - 41 U/L   ALT 23 0 - 44 U/L   Alkaline Phosphatase 74 38 - 126 U/L   Total Bilirubin 0.5 0.3 - 1.2 mg/dL   GFR, Estimated >84 >69 mL/min    Comment:  (NOTE) Calculated using the CKD-EPI Creatinine Equation (2021)    Anion gap 7 5 - 15    Comment: Performed at [facility], [address], [place], [place] [address]      Assessment & Plan:  Patient will continue taking all her medications. Will check fasting lipids before next follow-up. Problem List Items Addressed This Visit     Malignant neoplasm of lower-outer quadrant of left breast of female, estrogen receptor positive (HCC)   Moderate persistent asthma without complication   Impaired glucose tolerance   Essential hypertension, benign - Primary   Mixed hyperlipidemia    Follow up 4 months.  Total time spent: 25 minutes  [name], MD  05/24/2023   This document may have been prepared by [facility] Voice Recognition software and as such may include unintentional dictation errors.

## 2023-06-03 DIAGNOSIS — J301 Allergic rhinitis due to pollen: Secondary | ICD-10-CM | POA: Diagnosis not present

## 2023-06-10 DIAGNOSIS — J301 Allergic rhinitis due to pollen: Secondary | ICD-10-CM | POA: Diagnosis not present

## 2023-06-15 ENCOUNTER — Other Ambulatory Visit: Payer: Self-pay | Admitting: Internal Medicine

## 2023-06-15 DIAGNOSIS — E782 Mixed hyperlipidemia: Secondary | ICD-10-CM

## 2023-06-17 DIAGNOSIS — J301 Allergic rhinitis due to pollen: Secondary | ICD-10-CM | POA: Diagnosis not present

## 2023-06-24 DIAGNOSIS — J301 Allergic rhinitis due to pollen: Secondary | ICD-10-CM | POA: Diagnosis not present

## 2023-07-01 DIAGNOSIS — J301 Allergic rhinitis due to pollen: Secondary | ICD-10-CM | POA: Diagnosis not present

## 2023-07-15 DIAGNOSIS — J301 Allergic rhinitis due to pollen: Secondary | ICD-10-CM | POA: Diagnosis not present

## 2023-07-22 DIAGNOSIS — J301 Allergic rhinitis due to pollen: Secondary | ICD-10-CM | POA: Diagnosis not present

## 2023-07-29 DIAGNOSIS — J301 Allergic rhinitis due to pollen: Secondary | ICD-10-CM | POA: Diagnosis not present

## 2023-07-30 ENCOUNTER — Ambulatory Visit: Payer: No Typology Code available for payment source | Admitting: Plastic Surgery

## 2023-08-03 ENCOUNTER — Ambulatory Visit: Payer: No Typology Code available for payment source | Admitting: Plastic Surgery

## 2023-08-05 DIAGNOSIS — J301 Allergic rhinitis due to pollen: Secondary | ICD-10-CM | POA: Diagnosis not present

## 2023-08-09 ENCOUNTER — Encounter: Payer: Self-pay | Admitting: Plastic Surgery

## 2023-08-09 ENCOUNTER — Ambulatory Visit (INDEPENDENT_AMBULATORY_CARE_PROVIDER_SITE_OTHER): Payer: 59 | Admitting: Plastic Surgery

## 2023-08-09 VITALS — BP 134/79 | HR 80 | Ht 68.0 in | Wt 177.0 lb

## 2023-08-09 DIAGNOSIS — N651 Disproportion of reconstructed breast: Secondary | ICD-10-CM | POA: Diagnosis not present

## 2023-08-09 NOTE — Progress Notes (Signed)
Subjective:    Patient ID: Judy Padilla, female    DOB: 07-Oct-1966, 57 y.o.   MRN: [id number]  The patient is a 57 year old female here for follow-up on her breast.  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  She underwent a left mastectomy in April 2022.  She had a Mentor smooth round ultrahigh profile 430 cc gel implant placed in August 2022.  She is 5 feet 8 inches tall and weighs around 190 pounds.  She was seen in March 2023 and at that time was not interested in a right mastopexy.  She was finishing up her chemotherapy    Review of Systems  Constitutional: Negative.   HENT: Negative.    Eyes: Negative.   Respiratory: Negative.    Cardiovascular: Negative.   Gastrointestinal: Negative.   Endocrine: Negative.   Genitourinary: Negative.   Musculoskeletal: Negative.        Objective:   Physical Exam Constitutional:      Appearance: Normal appearance.  Cardiovascular:     Rate and Rhythm: Normal rate.  Pulmonary:     Effort: Pulmonary effort is normal.  Abdominal:     General: There is no distension.     Palpations: Abdomen is soft.  Skin:    General: Skin is warm.     Capillary Refill: Capillary refill takes less than 2 seconds.  Neurological:     Mental Status: She is alert and oriented to person, place, and time.  Psychiatric:        Mood and Affect: Mood normal.        Behavior: Behavior normal.        Thought Content: Thought content normal.        Judgment: Judgment normal.         Assessment & Plan:     ICD-10-CM   1. Breast asymmetry following reconstructive surgery  N65.1       The patient is pleased with her results.  She has noticed some relaxing of the implant on the left.  She is not interested in nipple areola tattooing or a right sided mastopexy.  There are no concerning lesions and her mammogram is up-to-date.  We talked about an ultrasound on the left breast in the next 2 to 3 years.  I would like to  see her back in 1 year.  Pictures were obtained of the patient and placed in the chart with the patient's or guardian's permission.

## 2023-08-10 ENCOUNTER — Other Ambulatory Visit: Payer: Self-pay

## 2023-08-12 DIAGNOSIS — J301 Allergic rhinitis due to pollen: Secondary | ICD-10-CM | POA: Diagnosis not present

## 2023-08-16 ENCOUNTER — Encounter: Payer: Self-pay | Admitting: Internal Medicine

## 2023-08-16 ENCOUNTER — Ambulatory Visit (INDEPENDENT_AMBULATORY_CARE_PROVIDER_SITE_OTHER): Payer: 59 | Admitting: Internal Medicine

## 2023-08-16 VITALS — BP 134/88 | HR 82 | Ht 68.0 in | Wt 176.2 lb

## 2023-08-16 DIAGNOSIS — E782 Mixed hyperlipidemia: Secondary | ICD-10-CM

## 2023-08-16 DIAGNOSIS — I10 Essential (primary) hypertension: Secondary | ICD-10-CM

## 2023-08-16 DIAGNOSIS — R200 Anesthesia of skin: Secondary | ICD-10-CM | POA: Diagnosis not present

## 2023-08-16 DIAGNOSIS — M19041 Primary osteoarthritis, right hand: Secondary | ICD-10-CM

## 2023-08-16 DIAGNOSIS — R202 Paresthesia of skin: Secondary | ICD-10-CM

## 2023-08-16 DIAGNOSIS — M19042 Primary osteoarthritis, left hand: Secondary | ICD-10-CM | POA: Diagnosis not present

## 2023-08-16 DIAGNOSIS — J309 Allergic rhinitis, unspecified: Secondary | ICD-10-CM | POA: Diagnosis not present

## 2023-08-16 MED ORDER — PREDNISONE 20 MG PO TABS: 40.0000 mg | ORAL_TABLET | Freq: Every day | ORAL | 0 refills | Status: DC

## 2023-08-16 NOTE — Progress Notes (Signed)
Established Patient Office Visit  Subjective:  Patient ID: Judy Padilla, female    DOB: 1966-10-16  Age: 57 y.o. MRN: [id number]  Chief Complaint  Patient presents with   Acute Visit    Hand going numb, right worse than left. Fingertips going numb.    Patient comes in with complaints of progressively worsening arthritic aches and pains involving both her hands right upper arm and both her knees.  Although her right knee pain is more chronic but the stiffness and pain in her hands is getting worse over last 1 month.  And now there is new complaint of numbness in her fingertips bilaterally. Patient is currently on Dupixent for asthma and Aromasin for breast cancer - both of which can cause  arthralgias as a side effect.  Patient has remote family history of SLE. She has taken OTC Aleve with some relief but still feels the pain in both her hands.  She felt that her strength in her hands was also compromised as she has been dropping things more frequently. Will check arthritis panel today.  Start prednisone for 5 days. Consider rheumatology consult.     No other concerns at this time.   Past Medical History:  Diagnosis Date   Asthma    well controlled   Breast cancer (HCC)    Family history of adverse reaction to anesthesia    sister-hard time waking up   Family history of breast cancer    Family history of prostate cancer    Family history of uterine cancer    GERD (gastroesophageal reflux disease)    Hypertension    Personal history of chemotherapy     Past Surgical History:  Procedure Laterality Date   ABLATION     BREAST BIOPSY Left 2011   Benign per pt   BREAST BIOPSY Left 12/12/2020   3:30 3 cmfn, Q marker, pos   BREAST BIOPSY Left 12/12/2020   3:30 1 cmfn, Vision marker, pos   BREAST RECONSTRUCTION WITH PLACEMENT OF TISSUE EXPANDER AND FLEX HD (ACELLULAR HYDRATED DERMIS) Left 02/10/2021   Procedure: IMMEDIATE LEFT BREAST RECONSTRUCTION WITH PLACEMENT OF  TISSUE EXPANDER AND FLEX HD (ACELLULAR HYDRATED DERMIS);  Surgeon: Peggye Form, DO;  Location: [facility];  Service: Plastics;  Laterality: Left;   COLONOSCOPY  01/15/2020   MASTECTOMY Left 2022   PORTACATH PLACEMENT Right 02/10/2021   Procedure: INSERTION PORT-A-CATH;  Surgeon: [name], MD;  Location: [facility];  Service: General;  Laterality: Right;   REMOVAL OF TISSUE EXPANDER AND PLACEMENT OF IMPLANT Left 07/09/2021   Procedure: REMOVAL OF TISSUE EXPANDER AND PLACEMENT OF IMPLANT LEFT BREAST;  Surgeon: Peggye Form, DO;  Location: [facility];  Service: Plastics;  Laterality: Left;   SIMPLE MASTECTOMY WITH AXILLARY SENTINEL NODE BIOPSY Left 02/10/2021   Procedure: SIMPLE MASTECTOMY WITH AXILLARY SENTINEL NODE BIOPSY;  Surgeon: [name], MD;  Location: [facility];  Service: General;  Laterality: Left;    Social History   Socioeconomic History   Marital status: Married    Spouse name: Not on file   Number of children: Not on file   Years of education: Not on file   Highest education level: Not on file  Occupational History   Not on file  Tobacco Use   Smoking status: Never   Smokeless tobacco: Never  Vaping Use   Vaping status: Never Used  Substance and Sexual Activity   Alcohol use: Not Currently   Drug use: Never  Sexual activity: Not on file  Other Topics Concern   Not on file  Social History Narrative   Lives in [place] with husband; kids- college. Works for [employer]- working from home. No smoking or alcohol.    Social Determinants of Health   Financial Resource Strain: Not on file  Food Insecurity: Not on file  Transportation Needs: Not on file  Physical Activity: Not on file  Stress: Not on file  Social Connections: Not on file  Intimate Partner Violence: Not on file    Family History  Problem Relation Age of Onset   Hypertension Mother    Diabetes Mother    Hypertension Father    Diabetes  Father    Cancer Father        prostate cancer-70s   Cancer Maternal Grandmother        uterine cancer   Breast cancer Sister        in in 63s.     Allergies  Allergen Reactions   Shrimp Extract Shortness Of Breath   Other Swelling    Cannot take Excedrin     Review of Systems  Constitutional: Negative.  Negative for chills, diaphoresis, fever, malaise/fatigue and weight loss.  HENT: Negative.    Eyes: Negative.   Respiratory: Negative.  Negative for cough and shortness of breath.   Cardiovascular: Negative.  Negative for chest pain, palpitations and leg swelling.  Gastrointestinal: Negative.  Negative for abdominal pain, constipation, diarrhea, heartburn, nausea and vomiting.  Genitourinary: Negative.  Negative for dysuria and flank pain.  Musculoskeletal:  Positive for joint pain. Negative for myalgias.  Skin: Negative.   Neurological: Negative.  Negative for dizziness and headaches.  Endo/Heme/Allergies: Negative.   Psychiatric/Behavioral: Negative.  Negative for depression and suicidal ideas. The patient is not nervous/anxious.        Objective:   BP 134/88   Pulse 82   Ht 5\' 8"  (1.727 m)   Wt 176 lb 3.2 oz (79.9 kg)   LMP 01/04/2018   SpO2 97%   BMI 26.79 kg/m   Vitals:   08/16/23 1056  BP: 134/88  Pulse: 82  Height: 5\' 8"  (1.727 m)  Weight: 176 lb 3.2 oz (79.9 kg)  SpO2: 97%  BMI (Calculated): 26.8    Physical Exam Vitals and nursing note reviewed.  Constitutional:      Appearance: Normal appearance.  HENT:     Head: Normocephalic and atraumatic.     Nose: Nose normal.     Mouth/Throat:     Mouth: Mucous membranes are moist.     Pharynx: Oropharynx is clear.  Eyes:     Conjunctiva/sclera: Conjunctivae normal.     Pupils: Pupils are equal, round, and reactive to light.  Cardiovascular:     Rate and Rhythm: Normal rate and regular rhythm.     Pulses: Normal pulses.     Heart sounds: Normal heart sounds. No murmur heard. Pulmonary:      Effort: Pulmonary effort is normal.     Breath sounds: Normal breath sounds. No wheezing.  Abdominal:     General: Bowel sounds are normal.     Palpations: Abdomen is soft.     Tenderness: There is no abdominal tenderness. There is no right CVA tenderness or left CVA tenderness.  Musculoskeletal:        General: No swelling, tenderness, deformity or signs of injury. Normal range of motion.     Cervical back: Normal range of motion.     Right lower leg:  No edema.     Left lower leg: No edema.  Skin:    General: Skin is warm and dry.  Neurological:     General: No focal deficit present.     Mental Status: She is alert and oriented to person, place, and time.  Psychiatric:        Mood and Affect: Mood normal.        Behavior: Behavior normal.      No results found for any visits on 08/16/23.  No results found for this or any previous visit (from the past 2160 hour(s)).    Assessment & Plan:  Arthritis panel. Prednisone burst Follow-up in 10 days to discuss further management. Problem List Items Addressed This Visit     Essential hypertension, benign   Mixed hyperlipidemia   Other Visit Diagnoses     Arthritis of both hands    -  Primary   Relevant Medications   predniSONE (DELTASONE) 20 MG tablet   Other Relevant Orders   Arthritis Panel   CK, total   CRP (C-Reactive Protein)   Numbness and tingling in both hands       Relevant Orders   CK, total   Magnesium   Vitamin B12   RBC Folate       Return in about 10 days (around 08/26/2023).   Total time spent: 30 minutes  [name], MD  08/16/2023   This document may have been prepared by [facility] Voice Recognition software and as such may include unintentional dictation errors.

## 2023-08-17 LAB — C-REACTIVE PROTEIN: CRP: <1 mg/L (ref 0–10)

## 2023-08-17 LAB — FOLATE RBC
Folate, Hemolysate: 345 ng/mL
Folate, RBC: 844 ng/mL (ref >498)
Hematocrit: 40.9 % (ref 34.0–46.6)

## 2023-08-17 LAB — ARTHRITIS PANEL
Anti Nuclear Antibody (ANA): NEGATIVE
Rheumatoid fact SerPl-aCnc: 11.1 [IU]/mL (ref <14.0)
Sed Rate: 3 mm/h (ref 0–40)
Uric Acid: 3 mg/dL (ref 3.0–7.2)

## 2023-08-17 LAB — MAGNESIUM: Magnesium: 2.1 mg/dL (ref 1.6–2.3)

## 2023-08-17 LAB — CK: Total CK: 241 U/L — ABNORMAL HIGH (ref 32–182)

## 2023-08-17 LAB — VITAMIN B12: Vitamin B-12: 1247 pg/mL — ABNORMAL HIGH (ref 232–1245)

## 2023-08-19 DIAGNOSIS — J301 Allergic rhinitis due to pollen: Secondary | ICD-10-CM | POA: Diagnosis not present

## 2023-08-27 ENCOUNTER — Encounter: Payer: Self-pay | Admitting: Internal Medicine

## 2023-08-27 ENCOUNTER — Ambulatory Visit (INDEPENDENT_AMBULATORY_CARE_PROVIDER_SITE_OTHER): Payer: 59 | Admitting: Internal Medicine

## 2023-08-27 ENCOUNTER — Telehealth: Payer: Self-pay | Admitting: Internal Medicine

## 2023-08-27 VITALS — BP 140/90 | HR 88 | Ht 68.0 in | Wt 176.6 lb

## 2023-08-27 DIAGNOSIS — E782 Mixed hyperlipidemia: Secondary | ICD-10-CM | POA: Diagnosis not present

## 2023-08-27 DIAGNOSIS — Z170 Estrogen receptor positive status [ER+]: Secondary | ICD-10-CM

## 2023-08-27 DIAGNOSIS — M19042 Primary osteoarthritis, left hand: Secondary | ICD-10-CM

## 2023-08-27 DIAGNOSIS — C50512 Malignant neoplasm of lower-outer quadrant of left female breast: Secondary | ICD-10-CM

## 2023-08-27 DIAGNOSIS — I10 Essential (primary) hypertension: Secondary | ICD-10-CM | POA: Diagnosis not present

## 2023-08-27 DIAGNOSIS — M19041 Primary osteoarthritis, right hand: Secondary | ICD-10-CM

## 2023-08-27 DIAGNOSIS — J4540 Moderate persistent asthma, uncomplicated: Secondary | ICD-10-CM

## 2023-08-27 NOTE — Telephone Encounter (Signed)
Call returned to patient and informed per doctor to stop the exemestane and that she will be getting a call to come in in 1 month to discuss another AI. She was in agreement with this . Message sent to scheduler to call patient with appointment

## 2023-08-27 NOTE — Telephone Encounter (Signed)
Patient walked up to ask for Medication questions: She states the medication Exemestane is causing pain in body, numbness and tingling in fingers, She is asking what can she do? Some days it is hard to flex her fingers. When she don't take it she is good. So she is missing some days.   Please call patient at [phone]

## 2023-08-27 NOTE — Progress Notes (Signed)
Established Patient Office Visit  Subjective:  Patient ID: Judy Padilla, female    DOB: March 05, 1966  Age: 57 y.o. MRN: [id number]  Chief Complaint  Patient presents with   Follow-up    10 day follow up    Patient comes in for her follow-up today.  She completed prednisone burst and the arthritic aches and pains in her hands and other joints have resolved almost completely.  She is still having some tingling and numbness on the fingertips.  However she also stopped using her Dupixent and Aromasin.  Patient will be discussing with her pulmonologist and oncologist about these changes. Currently her asthma is still under good control. Her blood pressure is elevated today, need to monitor.    No other concerns at this time.   Past Medical History:  Diagnosis Date   Asthma    well controlled   Breast cancer (HCC)    Family history of adverse reaction to anesthesia    sister-hard time waking up   Family history of breast cancer    Family history of prostate cancer    Family history of uterine cancer    GERD (gastroesophageal reflux disease)    Hypertension    Personal history of chemotherapy     Past Surgical History:  Procedure Laterality Date   ABLATION     BREAST BIOPSY Left 2011   Benign per pt   BREAST BIOPSY Left 12/12/2020   3:30 3 cmfn, Q marker, pos   BREAST BIOPSY Left 12/12/2020   3:30 1 cmfn, Vision marker, pos   BREAST RECONSTRUCTION WITH PLACEMENT OF TISSUE EXPANDER AND FLEX HD (ACELLULAR HYDRATED DERMIS) Left 02/10/2021   Procedure: IMMEDIATE LEFT BREAST RECONSTRUCTION WITH PLACEMENT OF TISSUE EXPANDER AND FLEX HD (ACELLULAR HYDRATED DERMIS);  Surgeon: Peggye Form, DO;  Location: [facility];  Service: Plastics;  Laterality: Left;   COLONOSCOPY  01/15/2020   MASTECTOMY Left 2022   PORTACATH PLACEMENT Right 02/10/2021   Procedure: INSERTION PORT-A-CATH;  Surgeon: [name], MD;  Location: [facility];  Service: General;  Laterality: Right;    REMOVAL OF TISSUE EXPANDER AND PLACEMENT OF IMPLANT Left 07/09/2021   Procedure: REMOVAL OF TISSUE EXPANDER AND PLACEMENT OF IMPLANT LEFT BREAST;  Surgeon: Peggye Form, DO;  Location: [facility];  Service: Plastics;  Laterality: Left;   SIMPLE MASTECTOMY WITH AXILLARY SENTINEL NODE BIOPSY Left 02/10/2021   Procedure: SIMPLE MASTECTOMY WITH AXILLARY SENTINEL NODE BIOPSY;  Surgeon: [name], MD;  Location: [facility];  Service: General;  Laterality: Left;    Social History   Socioeconomic History   Marital status: Married    Spouse name: Not on file   Number of children: Not on file   Years of education: Not on file   Highest education level: Not on file  Occupational History   Not on file  Tobacco Use   Smoking status: Never   Smokeless tobacco: Never  Vaping Use   Vaping status: Never Used  Substance and Sexual Activity   Alcohol use: Not Currently   Drug use: Never   Sexual activity: Not on file  Other Topics Concern   Not on file  Social History Narrative   Lives in [place] with husband; kids- college. Works for [employer]- working from home. No smoking or alcohol.    Social Determinants of Health   Financial Resource Strain: Not on file  Food Insecurity: Not on file  Transportation Needs: Not on file  Physical Activity: Not on file  Stress: Not on file  Social Connections: Not on file  Intimate Partner Violence: Not on file    Family History  Problem Relation Age of Onset   Hypertension Mother    Diabetes Mother    Hypertension Father    Diabetes Father    Cancer Father        prostate cancer-70s   Cancer Maternal Grandmother        uterine cancer   Breast cancer Sister        in in 8s.     Allergies  Allergen Reactions   Shrimp Extract Shortness Of Breath   Other Swelling    Cannot take Excedrin     Review of Systems  Constitutional: Negative.  Negative for chills, diaphoresis, fever, malaise/fatigue  and weight loss.  HENT: Negative.  Negative for congestion and sore throat.   Eyes: Negative.   Respiratory: Negative.  Negative for cough, shortness of breath and wheezing.   Cardiovascular: Negative.  Negative for chest pain, palpitations and leg swelling.  Gastrointestinal: Negative.  Negative for abdominal pain, constipation, diarrhea, heartburn, nausea and vomiting.  Genitourinary: Negative.  Negative for dysuria and flank pain.  Musculoskeletal: Negative.  Negative for joint pain and myalgias.  Skin: Negative.   Neurological: Negative.  Negative for dizziness and headaches.  Endo/Heme/Allergies: Negative.   Psychiatric/Behavioral: Negative.  Negative for depression and suicidal ideas. The patient is not nervous/anxious.        Objective:   BP (!) 140/90   Pulse 88   Ht 5\' 8"  (1.727 m)   Wt 176 lb 9.6 oz (80.1 kg)   LMP 01/04/2018   SpO2 99%   BMI 26.85 kg/m   Vitals:   08/27/23 0927  BP: (!) 140/90  Pulse: 88  Height: 5\' 8"  (1.727 m)  Weight: 176 lb 9.6 oz (80.1 kg)  SpO2: 99%  BMI (Calculated): 26.86    Physical Exam Vitals and nursing note reviewed.  Constitutional:      Appearance: Normal appearance.  HENT:     Head: Normocephalic and atraumatic.     Nose: Nose normal.     Mouth/Throat:     Mouth: Mucous membranes are moist.     Pharynx: Oropharynx is clear.  Eyes:     Conjunctiva/sclera: Conjunctivae normal.     Pupils: Pupils are equal, round, and reactive to light.  Cardiovascular:     Rate and Rhythm: Normal rate and regular rhythm.     Pulses: Normal pulses.     Heart sounds: Normal heart sounds. No murmur heard. Pulmonary:     Effort: Pulmonary effort is normal.     Breath sounds: Normal breath sounds. No wheezing.  Abdominal:     General: Bowel sounds are normal.     Palpations: Abdomen is soft.     Tenderness: There is no abdominal tenderness. There is no right CVA tenderness or left CVA tenderness.  Musculoskeletal:        General:  Normal range of motion.     Cervical back: Normal range of motion.     Right lower leg: No edema.     Left lower leg: No edema.  Skin:    General: Skin is warm and dry.  Neurological:     General: No focal deficit present.     Mental Status: She is alert and oriented to person, place, and time.  Psychiatric:        Mood and Affect: Mood normal.        Behavior:  Behavior normal.      No results found for any visits on 08/27/23.  Recent Results (from the past 2160 hour(s))  Arthritis Panel     Status: None   Collection Time: 08/16/23 11:24 AM  Result Value Ref Range   Uric Acid 3.0 3.0 - 7.2 mg/dL    Comment:            Therapeutic target for gout patients: <6.0   Anti Nuclear Antibody (ANA) Negative Negative   Rheumatoid fact SerPl-aCnc 11.1 <14.0 IU/mL   Sed Rate 3 0 - 40 mm/hr  CK, total     Status: Abnormal   Collection Time: 08/16/23 11:24 AM  Result Value Ref Range   Total CK 241 (H) 32 - 182 U/L  CRP (C-Reactive Protein)     Status: None   Collection Time: 08/16/23 11:24 AM  Result Value Ref Range   CRP <1 0 - 10 mg/L  Magnesium     Status: None   Collection Time: 08/16/23 11:24 AM  Result Value Ref Range   Magnesium 2.1 1.6 - 2.3 mg/dL  Vitamin V25     Status: Abnormal   Collection Time: 08/16/23 11:24 AM  Result Value Ref Range   Vitamin B-12 1,247 (H) 232 - 1,245 pg/mL  RBC Folate     Status: None   Collection Time: 08/16/23 11:24 AM  Result Value Ref Range   Folate, Hemolysate 345.0 Not Estab. ng/mL   Hematocrit 40.9 34.0 - 46.6 %   Folate, RBC 844 >498 ng/mL      Assessment & Plan:  Patient will discuss with her pulmonologist and oncologist about the medications that she has started. Needs to monitor her blood pressure. Return in 6 weeks, may need to adjust medication. Problem List Items Addressed This Visit     Malignant neoplasm of lower-outer quadrant of left breast of female, estrogen receptor positive (HCC)   Moderate persistent asthma without  complication   Essential hypertension, benign - Primary   Mixed hyperlipidemia   Other Visit Diagnoses     Arthritis of both hands           Return in about 6 weeks (around 10/08/2023).   Total time spent: 30 minutes  [name], MD  08/27/2023   This document may have been prepared by [facility] Voice Recognition software and as such may include unintentional dictation errors.

## 2023-08-30 DIAGNOSIS — J301 Allergic rhinitis due to pollen: Secondary | ICD-10-CM | POA: Diagnosis not present

## 2023-09-12 ENCOUNTER — Other Ambulatory Visit: Payer: Self-pay | Admitting: Internal Medicine

## 2023-09-15 ENCOUNTER — Other Ambulatory Visit: Payer: Self-pay | Admitting: Internal Medicine

## 2023-09-16 ENCOUNTER — Other Ambulatory Visit: Payer: Self-pay | Admitting: Internal Medicine

## 2023-09-16 DIAGNOSIS — J301 Allergic rhinitis due to pollen: Secondary | ICD-10-CM | POA: Diagnosis not present

## 2023-09-23 DIAGNOSIS — J301 Allergic rhinitis due to pollen: Secondary | ICD-10-CM | POA: Diagnosis not present

## 2023-09-24 ENCOUNTER — Ambulatory Visit: Payer: 59 | Admitting: Internal Medicine

## 2023-09-29 ENCOUNTER — Encounter: Payer: Self-pay | Admitting: Internal Medicine

## 2023-09-29 ENCOUNTER — Inpatient Hospital Stay: Payer: 59 | Attending: Internal Medicine | Admitting: Internal Medicine

## 2023-09-29 VITALS — BP 120/82 | HR 77 | Temp 98.0°F | Resp 16 | Wt 178.8 lb

## 2023-09-29 DIAGNOSIS — C50512 Malignant neoplasm of lower-outer quadrant of left female breast: Secondary | ICD-10-CM | POA: Diagnosis not present

## 2023-09-29 DIAGNOSIS — Z79811 Long term (current) use of aromatase inhibitors: Secondary | ICD-10-CM | POA: Diagnosis not present

## 2023-09-29 DIAGNOSIS — Z170 Estrogen receptor positive status [ER+]: Secondary | ICD-10-CM | POA: Insufficient documentation

## 2023-09-29 MED ORDER — LETROZOLE 2.5 MG PO TABS: 2.5000 mg | ORAL_TABLET | Freq: Every day | ORAL | 3 refills | Status: DC

## 2023-09-29 NOTE — Progress Notes (Signed)
Side effects from exemestane; pain all over her body. Aching all over. She has stopped taking it. Here to discuss alternatives.

## 2023-09-29 NOTE — Progress Notes (Signed)
[facility] CONSULT NOTE  Patient Care Team: [name], MD as PCP - General (Internal Medicine) [name], RN as Registered Nurse [name], MD as Consulting Physician (Internal Medicine) [name], MD as Consulting Physician (General Surgery) [name], [name], DO as Attending Physician (Plastic Surgery)  CHIEF COMPLAINTS/PURPOSE OF CONSULTATION: Breast cancer   Oncology History Overview Note  # April 2022-stage Ia mammary carcinoma ER/PR positive HER2 positive [TRIPLE positive]; negative margins s/p simple mastectomy with plan for immediate reconstruction [Dr.Rodenberg/[name]]; NO RT  # May 2nd, 2022- Taxol-Herceptin  # OCT 3rd, 2022- Anastrazole 1mg /day [AUG 2022- BMD- WNL];   # AUG 2023- DISCONTINUE  Anastrazole- sec to [facility] side effects; start Aromasin; stopped in OCT 2024- sec to [facility].   # NOV 20th, 2024- start letrozole+ chrondoitin biflex  # MUGA scan- 64% [03/06/2021]-   # LMP- mid 2020; Uterine ablation- [phone]; # HTN; Asthma- well controlled [on allergy shots];Marland Kitchen    Malignant neoplasm of lower-outer quadrant of left breast of female, estrogen receptor positive (HCC)  12/25/2020 Initial Diagnosis   Malignant neoplasm of lower-outer quadrant of left breast of female, estrogen receptor positive (HCC)   02/26/2021 Cancer Staging   Staging form: Breast, AJCC 8th Edition - Pathologic: Stage IA (pT1c, pN0, cM0, G3, ER+, PR+, HER2+) - Signed by [name], MD on 02/26/2021 Mitotic count score: Score 3 Histologic grading system: 3 grade system   03/10/2021 -  Chemotherapy   Patient is on Treatment Plan : BREAST Paclitaxel + Trastuzumab q7d / Trastuzumab q21d      Genetic Testing   Negative genetic testing. No pathogenic variants identified on the [facility] Multi-Cancer+RNA panel. 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 The report date is 04/29/2021.  The Multi-Cancer Panel + RNA offered by [facility] includes sequencing  and/or deletion duplication testing of the following 84 genes: AIP, ALK, APC, ATM, AXIN2,BAP1,  BARD1, BLM, BMPR1A, BRCA1, BRCA2, BRIP1, CASR, CDC73, CDH1, CDK4, CDKN1B, CDKN1C, CDKN2A (p14ARF), CDKN2A (p16INK4a), CEBPA, CHEK2, CTNNA1, DICER1, DIS3L2, EGFR (c.2369C>T, p.Thr790Met variant only), EPCAM (Deletion/duplication testing only), FH, FLCN, GATA2, GPC3, GREM1 (Promoter region deletion/duplication testing only), HOXB13 (c.251G>A, p.Gly84Glu), HRAS, KIT, MAX, MEN1, MET, MITF (c.952G>A, p.Glu318Lys variant only), MLH1, MSH2, MSH3, MSH6, MUTYH, NBN, NF1, NF2, NTHL1, PALB2, PDGFRA, PHOX2B, PMS2, POLD1, POLE, POT1, PRKAR1A, PTCH1, PTEN, RAD50, RAD51C, RAD51D, RB1, RECQL4, RET, RUNX1, SDHAF2, SDHA (sequence changes only), SDHB, SDHC, SDHD, SMAD4, SMARCA4, SMARCB1, SMARCE1, STK11, SUFU, TERC, TERT, TMEM127, TP53, TSC1, TSC2, VHL, WRN and WT1.    HISTORY OF PRESENTING ILLNESS: Alone.  Ambulating independently.  Judy Padilla 57 y.o.  female newly diagnosed breast cancer stage I ER/PR positive HER2/neu positive s/p adjuvant Herceptin; most recently on aromasin is here for follow-up.   Patient notes to have  pain all over her body. Aching all over. She has stopped taking aromasin.   Notes her improvement of her joint pains- not resolved.  No worsening hot flashes.  No constipation or diarrhea.  Review of Systems  Constitutional:  Negative for chills, diaphoresis, fever, malaise/fatigue and weight loss.  HENT:  Negative for nosebleeds and sore throat.   Eyes:  Negative for double vision.  Respiratory:  Negative for cough, hemoptysis, sputum production, shortness of breath and wheezing.   Cardiovascular:  Negative for chest pain, palpitations, orthopnea and leg swelling.  Gastrointestinal:  Negative for abdominal pain, blood in stool, constipation, diarrhea, heartburn, melena, nausea and vomiting.  Genitourinary:  Negative for dysuria, frequency and urgency.  Musculoskeletal:  Negative  for back pain  and joint pain.  Skin: Negative.  Negative for itching and rash.  Neurological:  Negative for dizziness, tingling, focal weakness, weakness and headaches.  Endo/Heme/Allergies:  Does not bruise/bleed easily.  Psychiatric/Behavioral:  Negative for depression. The patient is not nervous/anxious and does not have insomnia.      MEDICAL HISTORY:  Past Medical History:  Diagnosis Date   Asthma    well controlled   Breast cancer (HCC)    Family history of adverse reaction to anesthesia    sister-hard time waking up   Family history of breast cancer    Family history of prostate cancer    Family history of uterine cancer    GERD (gastroesophageal reflux disease)    Hypertension    Personal history of chemotherapy     SURGICAL HISTORY: Past Surgical History:  Procedure Laterality Date   ABLATION     BREAST BIOPSY Left 2011   Benign per pt   BREAST BIOPSY Left 12/12/2020   3:30 3 cmfn, Q marker, pos   BREAST BIOPSY Left 12/12/2020   3:30 1 cmfn, Vision marker, pos   BREAST RECONSTRUCTION WITH PLACEMENT OF TISSUE EXPANDER AND FLEX HD (ACELLULAR HYDRATED DERMIS) Left 02/10/2021   Procedure: IMMEDIATE LEFT BREAST RECONSTRUCTION WITH PLACEMENT OF TISSUE EXPANDER AND FLEX HD (ACELLULAR HYDRATED DERMIS);  Surgeon: Peggye Form, DO;  Location: [facility];  Service: Plastics;  Laterality: Left;   COLONOSCOPY  01/15/2020   MASTECTOMY Left 2022   PORTACATH PLACEMENT Right 02/10/2021   Procedure: INSERTION PORT-A-CATH;  Surgeon: [name], MD;  Location: [facility];  Service: General;  Laterality: Right;   REMOVAL OF TISSUE EXPANDER AND PLACEMENT OF IMPLANT Left 07/09/2021   Procedure: REMOVAL OF TISSUE EXPANDER AND PLACEMENT OF IMPLANT LEFT BREAST;  Surgeon: Peggye Form, DO;  Location: [facility];  Service: Plastics;  Laterality: Left;   SIMPLE MASTECTOMY WITH AXILLARY SENTINEL NODE BIOPSY Left 02/10/2021   Procedure: SIMPLE MASTECTOMY WITH AXILLARY SENTINEL  NODE BIOPSY;  Surgeon: [name], MD;  Location: [facility];  Service: General;  Laterality: Left;    SOCIAL HISTORY: Social History   Socioeconomic History   Marital status: Married    Spouse name: Not on file   Number of children: Not on file   Years of education: Not on file   Highest education level: Not on file  Occupational History   Not on file  Tobacco Use   Smoking status: Never   Smokeless tobacco: Never  Vaping Use   Vaping status: Never Used  Substance and Sexual Activity   Alcohol use: Not Currently   Drug use: Never   Sexual activity: Not on file  Other Topics Concern   Not on file  Social History Narrative   Lives in [place] with husband; kids- college. Works for [employer]- working from home. No smoking or alcohol.    Social Determinants of Health   Financial Resource Strain: Not on file  Food Insecurity: Not on file  Transportation Needs: Not on file  Physical Activity: Not on file  Stress: Not on file  Social Connections: Not on file  Intimate Partner Violence: Not on file    FAMILY HISTORY: Family History  Problem Relation Age of Onset   Hypertension Mother    Diabetes Mother    Hypertension Father    Diabetes Father    Cancer Father        prostate cancer-70s   Cancer Maternal Grandmother  uterine cancer   Breast cancer Sister        in in 44s.     ALLERGIES:  is allergic to shrimp extract and other.  MEDICATIONS:  Current Outpatient Medications  Medication Sig Dispense Refill   amLODipine (NORVASC) 10 MG tablet TAKE 1 TABLET BY MOUTH EVERY DAY 90 tablet 3   Cholecalciferol 1.25 MG (50000 UT) capsule Take 50,000 Units by mouth once a week.     DUPIXENT 300 MG/2ML SOPN Inject into the skin.     fluticasone (FLONASE) 50 MCG/ACT nasal spray Place 1 spray into both nostrils daily as needed for allergies.     letrozole (FEMARA) 2.5 MG tablet Take 1 tablet (2.5 mg total) by mouth daily. 30 tablet 3   Multiple  Vitamins-Minerals (CENTRUM ADULTS) TABS Take 1 tablet by mouth daily.     NON FORMULARY Takes weekly allergy shots     pantoprazole (PROTONIX) 40 MG tablet TAKE 1 TABLET BY MOUTH EVERY DAY 90 tablet 3   rosuvastatin (CRESTOR) 40 MG tablet TAKE 1 TABLET BY MOUTH EVERY DAY 90 tablet 3   albuterol (VENTOLIN HFA) 108 (90 Base) MCG/ACT inhaler Inhale 1 puff into the lungs every 6 (six) hours as needed for shortness of breath. (Patient not taking: Reported on 08/16/2023)     budesonide (PULMICORT) 0.5 MG/2ML nebulizer solution Take 2 mLs by nebulization daily. (Patient not taking: Reported on 08/16/2023)     EPINEPHrine 0.3 mg/0.3 mL IJ SOAJ injection Inject 0.3 mg into the muscle as needed for anaphylaxis. (Patient not taking: Reported on 08/16/2023)     No current facility-administered medications for this visit.      Marland Kitchen  PHYSICAL EXAMINATION: ECOG PERFORMANCE STATUS: 0 - Asymptomatic  Vitals:   09/29/23 0920  BP: 120/82  Pulse: 77  Resp: 16  Temp: 98 F (36.7 C)  SpO2: 100%   Filed Weights   09/29/23 0920  Weight: 178 lb 12.8 oz (81.1 kg)    Physical Exam HENT:     Head: Normocephalic and atraumatic.     Mouth/Throat:     Pharynx: No oropharyngeal exudate.  Eyes:     Pupils: Pupils are equal, round, and reactive to light.  Cardiovascular:     Rate and Rhythm: Normal rate and regular rhythm.  Pulmonary:     Effort: Pulmonary effort is normal. No respiratory distress.     Breath sounds: Normal breath sounds. No wheezing.  Abdominal:     General: Bowel sounds are normal. There is no distension.     Palpations: Abdomen is soft. There is no mass.     Tenderness: There is no abdominal tenderness. There is no guarding or rebound.  Musculoskeletal:        General: No tenderness. Normal range of motion.     Cervical back: Normal range of motion and neck supple.  Skin:    General: Skin is warm.  Neurological:     Mental Status: She is alert and oriented to person, place, and time.   Psychiatric:        Mood and Affect: Affect normal.      LABORATORY DATA:  I have reviewed the data as listed Lab Results  Component Value Date   WBC 4.3 05/06/2023   HGB 13.2 05/06/2023   HCT 40.9 08/16/2023   MCV 83.4 05/06/2023   PLT 281 05/06/2023   Recent Labs    01/04/23 0909 01/22/23 1052 05/06/23 1041  NA 139 142 140  K 3.9 5.1 4.1  CL 106 104 108  CO2 22 24 25   GLUCOSE 102* 86 96  BUN 16 18 20   CREATININE 0.80 0.91 0.85  CALCIUM 9.1 9.7 9.2  GFRNONAA >60  --  >60  PROT 7.1 6.5 7.0  ALBUMIN 4.4 4.5 4.4  AST 22 19 19   ALT 24 29 23   ALKPHOS 81 92 74  BILITOT 0.7 0.4 0.5    RADIOGRAPHIC STUDIES: I have personally reviewed the radiological images as listed and agreed with the findings in the report. No results found.  ASSESSMENT & PLAN:   Malignant neoplasm of lower-outer quadrant of left breast of female, estrogen receptor positive (HCC) # APRIL 2022- Left breast- CA- s/p mastectomy-  pT1c pN0 [Stage IA]Grade 3.  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  S/p  adjuvant Herceptin [april 2023]. STABLE;  s/p Adjuvant herceptin [finished April 2023]. JUNE 2024- Diagnostic Mammogram Right Unilateral- WNL.   # most recently on  aromasin-tolerating poorly. Switch to Letrozole-   script sent. Recommend talking to PCP re: crestor re: myalgias.   # Musculoskeletal G-2-3- sec to AI- recommend Switch to Letrozole-  #Drop in ejection fraction- Asymptomatic. FEB 2024- left ventricular ejection fraction equals 55.7 % (comparable to 50.4 % on most recent MUGA scan)-  stable  # 2022-BMD- 2024- T-score of -0.2. continue vit D.   # IV access: PIV:   # DISPOSITION:  # as planned-  follow up in 2nd week JAN 2025- MD; labs- cbc/cmp; vit D 25-OH level- -- Dr.B    All questions were answered. The patient/family knows to call the clinic with any problems, questions or concerns.    [name], MD 09/29/2023 9:37 AM

## 2023-09-29 NOTE — Assessment & Plan Note (Signed)
#   APRIL 2022- Left breast- CA- s/p mastectomy-  pT1c pN0 [Stage IA]Grade 3.  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  S/p  adjuvant Herceptin [april 2023]. STABLE;  s/p Adjuvant herceptin [finished April 2023]. JUNE 2024- Diagnostic Mammogram Right Unilateral- WNL.   # most recently on  aromasin-tolerating poorly. Switch to Letrozole-   script sent. Recommend talking to PCP re: crestor re: myalgias.   # Musculoskeletal G-2-3- sec to AI- recommend Switch to Letrozole-  #Drop in ejection fraction- Asymptomatic. FEB 2024- left ventricular ejection fraction equals 55.7 % (comparable to 50.4 % on most recent MUGA scan)-  stable  # 2022-BMD- 2024- T-score of -0.2. continue vit D.   # IV access: PIV:   # DISPOSITION:  # as planned-  follow up in 2nd week JAN 2025- MD; labs- cbc/cmp; vit D 25-OH level- -- Dr.B

## 2023-10-06 DIAGNOSIS — M1711 Unilateral primary osteoarthritis, right knee: Secondary | ICD-10-CM | POA: Diagnosis not present

## 2023-10-13 ENCOUNTER — Other Ambulatory Visit: Payer: Self-pay | Admitting: Family

## 2023-10-14 DIAGNOSIS — J301 Allergic rhinitis due to pollen: Secondary | ICD-10-CM | POA: Diagnosis not present

## 2023-10-22 ENCOUNTER — Encounter: Payer: 59 | Admitting: Internal Medicine

## 2023-10-25 ENCOUNTER — Encounter: Payer: 59 | Admitting: Internal Medicine

## 2023-11-18 ENCOUNTER — Inpatient Hospital Stay: Payer: 59 | Attending: Internal Medicine

## 2023-11-18 ENCOUNTER — Inpatient Hospital Stay (HOSPITAL_BASED_OUTPATIENT_CLINIC_OR_DEPARTMENT_OTHER): Payer: 59 | Admitting: Internal Medicine

## 2023-11-18 ENCOUNTER — Encounter: Payer: Self-pay | Admitting: Internal Medicine

## 2023-11-18 VITALS — BP 132/86 | HR 70 | Temp 97.6°F | Resp 20 | Wt 177.7 lb

## 2023-11-18 DIAGNOSIS — Z170 Estrogen receptor positive status [ER+]: Secondary | ICD-10-CM | POA: Insufficient documentation

## 2023-11-18 DIAGNOSIS — D72819 Decreased white blood cell count, unspecified: Secondary | ICD-10-CM | POA: Diagnosis not present

## 2023-11-18 DIAGNOSIS — J301 Allergic rhinitis due to pollen: Secondary | ICD-10-CM | POA: Diagnosis not present

## 2023-11-18 DIAGNOSIS — C50512 Malignant neoplasm of lower-outer quadrant of left female breast: Secondary | ICD-10-CM | POA: Insufficient documentation

## 2023-11-18 DIAGNOSIS — Z79811 Long term (current) use of aromatase inhibitors: Secondary | ICD-10-CM | POA: Insufficient documentation

## 2023-11-18 LAB — CMP (CANCER CENTER ONLY)
ALT: 23 U/L (ref 0–44)
AST: 21 U/L (ref 15–41)
Albumin: 4.2 g/dL (ref 3.5–5.0)
Alkaline Phosphatase: 67 U/L (ref 38–126)
Anion gap: 8 (ref 5–15)
BUN: 17 mg/dL (ref 6–20)
CO2: 26 mmol/L (ref 22–32)
Calcium: 9 mg/dL (ref 8.9–10.3)
Chloride: 107 mmol/L (ref 98–111)
Creatinine: 0.84 mg/dL (ref 0.44–1.00)
GFR, Estimated: >60 mL/min (ref >60)
Glucose, Bld: 108 mg/dL — ABNORMAL HIGH (ref 70–99)
Potassium: 3.5 mmol/L (ref 3.5–5.1)
Sodium: 141 mmol/L (ref 135–145)
Total Bilirubin: 0.5 mg/dL (ref 0.0–1.2)
Total Protein: 7 g/dL (ref 6.5–8.1)

## 2023-11-18 LAB — CBC WITH DIFFERENTIAL (CANCER CENTER ONLY)
Abs Immature Granulocytes: 0.01 10*3/uL (ref 0.00–0.07)
Basophils Absolute: 0 10*3/uL (ref 0.0–0.1)
Basophils Relative: 1 %
Eosinophils Absolute: 0.1 10*3/uL (ref 0.0–0.5)
Eosinophils Relative: 4 %
HCT: 38.8 % (ref 36.0–46.0)
Hemoglobin: 12.7 g/dL (ref 12.0–15.0)
Immature Granulocytes: 0 %
Lymphocytes Relative: 38 %
Lymphs Abs: 1.4 10*3/uL (ref 0.7–4.0)
MCH: 27 pg (ref 26.0–34.0)
MCHC: 32.7 g/dL (ref 30.0–36.0)
MCV: 82.4 fL (ref 80.0–100.0)
Monocytes Absolute: 0.3 10*3/uL (ref 0.1–1.0)
Monocytes Relative: 8 %
Neutro Abs: 1.8 10*3/uL (ref 1.7–7.7)
Neutrophils Relative %: 49 %
Platelet Count: 267 10*3/uL (ref 150–400)
RBC: 4.71 MIL/uL (ref 3.87–5.11)
RDW: 14 % (ref 11.5–15.5)
WBC Count: 3.7 10*3/uL — ABNORMAL LOW (ref 4.0–10.5)
nRBC: 0 % (ref 0.0–0.2)

## 2023-11-18 LAB — VITAMIN D 25 HYDROXY (VIT D DEFICIENCY, FRACTURES): Vit D, 25-Hydroxy: 72.35 ng/mL (ref 30–100)

## 2023-11-18 MED ORDER — LETROZOLE 2.5 MG PO TABS: 2.5000 mg | ORAL_TABLET | Freq: Every day | ORAL | 1 refills | Status: DC

## 2023-11-18 NOTE — Assessment & Plan Note (Addendum)
#   APRIL 2022- Left breast- CA- s/p mastectomy-  pT1c pN0 [Stage IA]Grade 3.  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  S/p  adjuvant Herceptin  [april 2023]. STABLE;  s/p Adjuvant herceptin  [finished April 2023]. JUNE 2024- Diagnostic Mammogram Right Unilateral- WNL.   # currently on Letrozole -  Tolerating well.  Continue letrozole .  # Musculoskeletal G-1- tolerating well Letrozole -  #Drop in ejection fraction- Asymptomatic. FEB 2024- left ventricular ejection fraction equals 55.7 % (comparable to 50.4 % on most recent MUGA scan)-  stable  # 2022-BMD- 2024- T-score of -0.2. continue vit D-50,k/weeekly  stable  # Leucopenia- 3.7- likely benign ethnic-asymptomatic.  # IV access: PIV:   # DISPOSITION:  # screening RIght mammo in June 2025.  # as planned-  follow up in 6 month   MD; labs- cbc/cmp; vit D 25-OH level- -- Dr.B

## 2023-11-18 NOTE — Progress Notes (Signed)
 Patient has no concerns today.

## 2023-11-18 NOTE — Progress Notes (Signed)
 [facility] CONSULT NOTE  Patient Care Team: Fernand Fredy RAMAN, MD as PCP - General (Internal Medicine) Cindie Jesusa HERO, RN as Registered Nurse Rennie Cindy SAUNDERS, MD as Consulting Physician (Internal Medicine) Lane Shope, MD as Consulting Physician (General Surgery) Lowery Estefana RAMAN, DO as Attending Physician (Plastic Surgery)  CHIEF COMPLAINTS/PURPOSE OF CONSULTATION: Breast cancer   Oncology History Overview Note  # April 2022-stage Ia mammary carcinoma ER/PR positive HER2 positive [TRIPLE positive]; negative margins s/p simple mastectomy with plan for immediate reconstruction [Dr.Rodenberg/[name]]; NO RT  # May 2nd, 2022- Taxol -Herceptin   # OCT 3rd, 2022- Anastrazole 1mg /day [AUG 2022- BMD- WNL];   # AUG 2023- DISCONTINUE  Anastrazole- sec to [facility] side effects; start Aromasin ; stopped in OCT 2024- sec to [facility].   # NOV 20th, 2024- start letrozole + chrondoitin biflex  # MUGA scan- 64% [03/06/2021]-   # LMP- mid 2020; Uterine ablation- [id number]; # HTN; Asthma- well controlled [on allergy shots];SABRA    Malignant neoplasm of lower-outer quadrant of left breast of female, estrogen receptor positive (HCC)  12/25/2020 Initial Diagnosis   Malignant neoplasm of lower-outer quadrant of left breast of female, estrogen receptor positive (HCC)   02/26/2021 Cancer Staging   Staging form: Breast, AJCC 8th Edition - Pathologic: Stage IA (pT1c, pN0, cM0, G3, ER+, PR+, HER2+) - Signed by Rennie Cindy SAUNDERS, MD on 02/26/2021 Mitotic count score: Score 3 Histologic grading system: 3 grade system   03/10/2021 -  Chemotherapy   Patient is on Treatment Plan : BREAST Paclitaxel  + Trastuzumab  q7d / Trastuzumab  q21d      Genetic Testing   Negative genetic testing. No pathogenic variants identified on the [facility] Multi-Cancer+RNA panel. 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 The report date is 04/29/2021.  The Multi-Cancer Panel + RNA offered by [facility] includes sequencing  and/or deletion duplication testing of the following 84 genes: AIP, ALK, APC, ATM, AXIN2,BAP1,  BARD1, BLM, BMPR1A, BRCA1, BRCA2, BRIP1, CASR, CDC73, CDH1, CDK4, CDKN1B, CDKN1C, CDKN2A (p14ARF), CDKN2A (p16INK4a), CEBPA, CHEK2, CTNNA1, DICER1, DIS3L2, EGFR (c.2369C>T, p.Thr790Met variant only), EPCAM (Deletion/duplication testing only), FH, FLCN, GATA2, GPC3, GREM1 (Promoter region deletion/duplication testing only), HOXB13 (c.251G>A, p.Gly84Glu), HRAS, KIT, MAX, MEN1, MET, MITF (c.952G>A, p.Glu318Lys variant only), MLH1, MSH2, MSH3, MSH6, MUTYH, NBN, NF1, NF2, NTHL1, PALB2, PDGFRA, PHOX2B, PMS2, POLD1, POLE, POT1, PRKAR1A, PTCH1, PTEN, RAD50, RAD51C, RAD51D, RB1, RECQL4, RET, RUNX1, SDHAF2, SDHA (sequence changes only), SDHB, SDHC, SDHD, SMAD4, SMARCA4, SMARCB1, SMARCE1, STK11, SUFU, TERC, TERT, TMEM127, TP53, TSC1, TSC2, VHL, WRN and WT1.    HISTORY OF PRESENTING ILLNESS: Alone.  Ambulating independently.  Judy Padilla 58 y.o.  female newly diagnosed breast cancer stage I ER/PR positive HER2/neu positive s/p adjuvant Herceptin ; most recently on letrozole  is here for follow-up.   Notes her improvement of her joint pains--no worsening pain on letrozole .  No worsening hot flashes.  No constipation or diarrhea.  Review of Systems  Constitutional:  Negative for chills, diaphoresis, fever, malaise/fatigue and weight loss.  HENT:  Negative for nosebleeds and sore throat.   Eyes:  Negative for double vision.  Respiratory:  Negative for cough, hemoptysis, sputum production, shortness of breath and wheezing.   Cardiovascular:  Negative for chest pain, palpitations, orthopnea and leg swelling.  Gastrointestinal:  Negative for abdominal pain, blood in stool, constipation, diarrhea, heartburn, melena, nausea and vomiting.  Genitourinary:  Negative for dysuria, frequency and urgency.  Musculoskeletal:  Negative for back pain and joint pain.  Skin: Negative.  Negative for itching and rash.  Neurological:  Negative for dizziness, tingling, focal weakness, weakness and headaches.  Endo/Heme/Allergies:  Does not bruise/bleed easily.  Psychiatric/Behavioral:  Negative for depression. The patient is not nervous/anxious and does not have insomnia.      MEDICAL HISTORY:  Past Medical History:  Diagnosis Date   Asthma    well controlled   Breast cancer (HCC)    Family history of adverse reaction to anesthesia    sister-hard time waking up   Family history of breast cancer    Family history of prostate cancer    Family history of uterine cancer    GERD (gastroesophageal reflux disease)    Hypertension    Personal history of chemotherapy     SURGICAL HISTORY: Past Surgical History:  Procedure Laterality Date   ABLATION     BREAST BIOPSY Left 2011   Benign per pt   BREAST BIOPSY Left 12/12/2020   3:30 3 cmfn, Q marker, pos   BREAST BIOPSY Left 12/12/2020   3:30 1 cmfn, Vision marker, pos   BREAST RECONSTRUCTION WITH PLACEMENT OF TISSUE EXPANDER AND FLEX HD (ACELLULAR HYDRATED DERMIS) Left 02/10/2021   Procedure: IMMEDIATE LEFT BREAST RECONSTRUCTION WITH PLACEMENT OF TISSUE EXPANDER AND FLEX HD (ACELLULAR HYDRATED DERMIS);  Surgeon: Lowery Estefana RAMAN, DO;  Location: [facility];  Service: Plastics;  Laterality: Left;   COLONOSCOPY  01/15/2020   MASTECTOMY Left 2022   PORTACATH PLACEMENT Right 02/10/2021   Procedure: INSERTION PORT-A-CATH;  Surgeon: Lane Shope, MD;  Location: [facility];  Service: General;  Laterality: Right;   REMOVAL OF TISSUE EXPANDER AND PLACEMENT OF IMPLANT Left 07/09/2021   Procedure: REMOVAL OF TISSUE EXPANDER AND PLACEMENT OF IMPLANT LEFT BREAST;  Surgeon: Lowery Estefana RAMAN, DO;  Location: [facility];  Service: Plastics;  Laterality: Left;   SIMPLE MASTECTOMY WITH AXILLARY SENTINEL NODE BIOPSY Left 02/10/2021   Procedure: SIMPLE MASTECTOMY WITH AXILLARY SENTINEL NODE BIOPSY;  Surgeon: Lane Shope, MD;  Location: [facility];  Service:  General;  Laterality: Left;    SOCIAL HISTORY: Social History   Socioeconomic History   Marital status: Married    Spouse name: Not on file   Number of children: Not on file   Years of education: Not on file   Highest education level: Not on file  Occupational History   Not on file  Tobacco Use   Smoking status: Never   Smokeless tobacco: Never  Vaping Use   Vaping status: Never Used  Substance and Sexual Activity   Alcohol use: Not Currently   Drug use: Never   Sexual activity: Not on file  Other Topics Concern   Not on file  Social History Narrative   Lives in [place] with husband; kids- college. Works for [employer]- working from home. No smoking or alcohol.    Social Drivers of Corporate Investment Banker Strain: Not on file  Food Insecurity: Not on file  Transportation Needs: Not on file  Physical Activity: Not on file  Stress: Not on file  Social Connections: Not on file  Intimate Partner Violence: Not on file    FAMILY HISTORY: Family History  Problem Relation Age of Onset   Hypertension Mother    Diabetes Mother    Hypertension Father    Diabetes Father    Cancer Father        prostate cancer-70s   Cancer Maternal Grandmother        uterine cancer   Breast cancer Sister        in in  60s.     ALLERGIES:  is allergic to shrimp extract and other.  MEDICATIONS:  Current Outpatient Medications  Medication Sig Dispense Refill   amLODipine (NORVASC) 10 MG tablet TAKE 1 TABLET BY MOUTH EVERY DAY 90 tablet 3   Cholecalciferol (VITAMIN D3) 1.25 MG (50000 UT) CAPS TAKE 1 CAPSULE BY MOUTH ONE TIME PER WEEK 12 capsule 3   DUPIXENT 300 MG/2ML SOPN Inject into the skin.     fluticasone (FLONASE) 50 MCG/ACT nasal spray Place 1 spray into both nostrils daily as needed for allergies.     Multiple Vitamins-Minerals (CENTRUM ADULTS) TABS Take 1 tablet by mouth daily.     NON FORMULARY Takes weekly allergy shots     pantoprazole (PROTONIX) 40 MG  tablet TAKE 1 TABLET BY MOUTH EVERY DAY 90 tablet 3   rosuvastatin (CRESTOR) 40 MG tablet TAKE 1 TABLET BY MOUTH EVERY DAY (Patient taking differently: Take 20 mg by mouth daily.) 90 tablet 3   albuterol (VENTOLIN HFA) 108 (90 Base) MCG/ACT inhaler Inhale 1 puff into the lungs every 6 (six) hours as needed for shortness of breath. (Patient not taking: Reported on 08/16/2023)     budesonide (PULMICORT) 0.5 MG/2ML nebulizer solution Take 2 mLs by nebulization daily. (Patient not taking: Reported on 08/16/2023)     EPINEPHrine  0.3 mg/0.3 mL IJ SOAJ injection Inject 0.3 mg into the muscle as needed for anaphylaxis. (Patient not taking: Reported on 11/18/2023)     letrozole  (FEMARA ) 2.5 MG tablet Take 1 tablet (2.5 mg total) by mouth daily. 90 tablet 1   No current facility-administered medications for this visit.      SABRA  PHYSICAL EXAMINATION: ECOG PERFORMANCE STATUS: 0 - Asymptomatic  Vitals:   11/18/23 0944  BP: 132/86  Pulse: 70  Resp: 20  Temp: 97.6 F (36.4 C)  SpO2: 100%   Filed Weights   11/18/23 0944  Weight: 177 lb 11.2 oz (80.6 kg)    Physical Exam HENT:     Head: Normocephalic and atraumatic.     Mouth/Throat:     Pharynx: No oropharyngeal exudate.  Eyes:     Pupils: Pupils are equal, round, and reactive to light.  Cardiovascular:     Rate and Rhythm: Normal rate and regular rhythm.  Pulmonary:     Effort: Pulmonary effort is normal. No respiratory distress.     Breath sounds: Normal breath sounds. No wheezing.  Abdominal:     General: Bowel sounds are normal. There is no distension.     Palpations: Abdomen is soft. There is no mass.     Tenderness: There is no abdominal tenderness. There is no guarding or rebound.  Musculoskeletal:        General: No tenderness. Normal range of motion.     Cervical back: Normal range of motion and neck supple.  Skin:    General: Skin is warm.  Neurological:     Mental Status: She is alert and oriented to person, place, and time.   Psychiatric:        Mood and Affect: Affect normal.      LABORATORY DATA:  I have reviewed the data as listed Lab Results  Component Value Date   WBC 3.7 (L) 11/18/2023   HGB 12.7 11/18/2023   HCT 38.8 11/18/2023   MCV 82.4 11/18/2023   PLT 267 11/18/2023   Recent Labs    01/04/23 0909 01/22/23 1052 05/06/23 1041 11/18/23 0925  NA 139 142 140 141  K 3.9 5.1 4.1  3.5  CL 106 104 108 107  CO2 22 24 25 26   GLUCOSE 102* 86 96 108*  BUN 16 18 20 17   CREATININE 0.80 0.91 0.85 0.84  CALCIUM 9.1 9.7 9.2 9.0  GFRNONAA >60  --  >60 >60  PROT 7.1 6.5 7.0 7.0  ALBUMIN 4.4 4.5 4.4 4.2  AST 22 19 19 21   ALT 24 29 23 23   ALKPHOS 81 92 74 67  BILITOT 0.7 0.4 0.5 0.5    RADIOGRAPHIC STUDIES: I have personally reviewed the radiological images as listed and agreed with the findings in the report. No results found.  ASSESSMENT & PLAN:   Malignant neoplasm of lower-outer quadrant of left breast of female, estrogen receptor positive (HCC) # APRIL 2022- Left breast- CA- s/p mastectomy-  pT1c pN0 [Stage IA]Grade 3.  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  S/p  adjuvant Herceptin  [april 2023]. STABLE;  s/p Adjuvant herceptin  [finished April 2023]. JUNE 2024- Diagnostic Mammogram Right Unilateral- WNL.   # currently on Letrozole -  Tolerating well.  Continue letrozole .  # Musculoskeletal G-1- tolerating well Letrozole -  #Drop in ejection fraction- Asymptomatic. FEB 2024- left ventricular ejection fraction equals 55.7 % (comparable to 50.4 % on most recent MUGA scan)-  stable  # 2022-BMD- 2024- T-score of -0.2. continue vit D-50,k/weeekly  stable  # Leucopenia- 3.7- likely benign ethnic-asymptomatic.  # IV access: PIV:   # DISPOSITION:  # screening RIght mammo in June 2025.  # as planned-  follow up in 6 month   MD; labs- cbc/cmp; vit D 25-OH level- -- Dr.B     All questions were answered. The patient/family knows to call the clinic with any problems, questions or concerns.     Cindy JONELLE Joe, MD 11/18/2023 10:35 AM

## 2023-11-22 DIAGNOSIS — R0602 Shortness of breath: Secondary | ICD-10-CM | POA: Diagnosis not present

## 2023-11-23 DIAGNOSIS — J301 Allergic rhinitis due to pollen: Secondary | ICD-10-CM | POA: Diagnosis not present

## 2024-01-08 DIAGNOSIS — S4361XA Sprain of right sternoclavicular joint, initial encounter: Secondary | ICD-10-CM | POA: Diagnosis not present

## 2024-01-13 DIAGNOSIS — J301 Allergic rhinitis due to pollen: Secondary | ICD-10-CM | POA: Diagnosis not present

## 2024-01-18 ENCOUNTER — Encounter: Payer: Self-pay | Admitting: Internal Medicine

## 2024-01-18 ENCOUNTER — Ambulatory Visit (INDEPENDENT_AMBULATORY_CARE_PROVIDER_SITE_OTHER): Payer: 59 | Admitting: Internal Medicine

## 2024-01-18 VITALS — BP 122/76 | HR 80 | Ht 68.0 in | Wt 176.2 lb

## 2024-01-18 DIAGNOSIS — Z0001 Encounter for general adult medical examination with abnormal findings: Secondary | ICD-10-CM | POA: Diagnosis not present

## 2024-01-18 DIAGNOSIS — I10 Essential (primary) hypertension: Secondary | ICD-10-CM

## 2024-01-18 DIAGNOSIS — R7303 Prediabetes: Secondary | ICD-10-CM | POA: Diagnosis not present

## 2024-01-18 DIAGNOSIS — Z0000 Encounter for general adult medical examination without abnormal findings: Secondary | ICD-10-CM

## 2024-01-18 DIAGNOSIS — E782 Mixed hyperlipidemia: Secondary | ICD-10-CM | POA: Diagnosis not present

## 2024-01-18 NOTE — Progress Notes (Signed)
 Established Patient Office Visit  Subjective:  Patient ID: Judy Padilla, female    DOB: [birth year]  Age: 58 y.o. MRN: [id number]  Chief Complaint  Patient presents with   Annual Exam    CPE    Patient is here for complete physical.  She is up-to-date on her mammogram and DEXA scan. Her last Pap smear was in 2021, negative and HPV negative. Colonoscopy was also 2021-negative. Patient generally feels well except for knee pain. Asthma is under very good control since started Dupixent. She is taking all her medications regularly.    No other concerns at this time.   Past Medical History:  Diagnosis Date   Asthma    well controlled   Breast cancer (HCC)    Family history of adverse reaction to anesthesia    sister-hard time waking up   Family history of breast cancer    Family history of prostate cancer    Family history of uterine cancer    GERD (gastroesophageal reflux disease)    Hypertension    Personal history of chemotherapy     Past Surgical History:  Procedure Laterality Date   ABLATION     BREAST BIOPSY Left 2011   Benign per pt   BREAST BIOPSY Left 12/12/2020   3:30 3 cmfn, Q marker, pos   BREAST BIOPSY Left 12/12/2020   3:30 1 cmfn, Vision marker, pos   BREAST RECONSTRUCTION WITH PLACEMENT OF TISSUE EXPANDER AND FLEX HD (ACELLULAR HYDRATED DERMIS) Left 02/10/2021   Procedure: IMMEDIATE LEFT BREAST RECONSTRUCTION WITH PLACEMENT OF TISSUE EXPANDER AND FLEX HD (ACELLULAR HYDRATED DERMIS);  Surgeon: Peggye Form, DO;  Location: [facility];  Service: Plastics;  Laterality: Left;   COLONOSCOPY  01/15/2020   MASTECTOMY Left 2022   PORTACATH PLACEMENT Right 02/10/2021   Procedure: INSERTION PORT-A-CATH;  Surgeon: [name], MD;  Location: [facility];  Service: General;  Laterality: Right;   REMOVAL OF TISSUE EXPANDER AND PLACEMENT OF IMPLANT Left 07/09/2021   Procedure: REMOVAL OF TISSUE EXPANDER AND PLACEMENT OF IMPLANT LEFT BREAST;  Surgeon:  Peggye Form, DO;  Location: [facility];  Service: Plastics;  Laterality: Left;   SIMPLE MASTECTOMY WITH AXILLARY SENTINEL NODE BIOPSY Left 02/10/2021   Procedure: SIMPLE MASTECTOMY WITH AXILLARY SENTINEL NODE BIOPSY;  Surgeon: [name], MD;  Location: [facility];  Service: General;  Laterality: Left;    Social History   Socioeconomic History   Marital status: Married    Spouse name: Not on file   Number of children: Not on file   Years of education: Not on file   Highest education level: Not on file  Occupational History   Not on file  Tobacco Use   Smoking status: Never   Smokeless tobacco: Never  Vaping Use   Vaping status: Never Used  Substance and Sexual Activity   Alcohol use: Not Currently   Drug use: Never   Sexual activity: Not on file  Other Topics Concern   Not on file  Social History Narrative   Lives in [place] with husband; kids- college. Works for [employer]- working from home. No smoking or alcohol.    Social Drivers of Corporate investment banker Strain: Not on file  Food Insecurity: Not on file  Transportation Needs: Not on file  Physical Activity: Not on file  Stress: Not on file  Social Connections: Not on file  Intimate Partner Violence: Not on file    Family History  Problem Relation Age  of Onset   Hypertension Mother    Diabetes Mother    Hypertension Father    Diabetes Father    Cancer Father        prostate cancer-70s   Cancer Maternal Grandmother        uterine cancer   Breast cancer Sister        in in 69s.     Allergies  Allergen Reactions   Shrimp Extract Shortness Of Breath   Other Swelling    Cannot take Excedrin     Outpatient Medications Prior to Visit  Medication Sig   amLODipine (NORVASC) 10 MG tablet TAKE 1 TABLET BY MOUTH EVERY DAY   budesonide (PULMICORT) 0.5 MG/2ML nebulizer solution Take 2 mLs by nebulization daily.   DUPIXENT 300 MG/2ML SOPN Inject into the skin.    fluticasone (FLONASE) 50 MCG/ACT nasal spray Place 1 spray into both nostrils daily as needed for allergies.   letrozole (FEMARA) 2.5 MG tablet Take 1 tablet (2.5 mg total) by mouth daily.   Multiple Vitamins-Minerals (CENTRUM ADULTS) TABS Take 1 tablet by mouth daily.   NON FORMULARY Takes weekly allergy shots   pantoprazole (PROTONIX) 40 MG tablet TAKE 1 TABLET BY MOUTH EVERY DAY   rosuvastatin (CRESTOR) 40 MG tablet TAKE 1 TABLET BY MOUTH EVERY DAY (Patient taking differently: Take 20 mg by mouth daily.)   albuterol (VENTOLIN HFA) 108 (90 Base) MCG/ACT inhaler Inhale 1 puff into the lungs every 6 (six) hours as needed for shortness of breath. (Patient not taking: Reported on 01/18/2024)   EPINEPHrine 0.3 mg/0.3 mL IJ SOAJ injection Inject 0.3 mg into the muscle as needed for anaphylaxis. (Patient not taking: Reported on 01/18/2024)   [DISCONTINUED] Cholecalciferol (VITAMIN D3) 1.25 MG (50000 UT) CAPS TAKE 1 CAPSULE BY MOUTH ONE TIME PER WEEK (Patient not taking: Reported on 01/18/2024)   No facility-administered medications prior to visit.    Review of Systems  Constitutional: Negative.  Negative for chills, diaphoresis, fever, malaise/fatigue and weight loss.  HENT: Negative.  Negative for congestion, sinus pain and sore throat.   Eyes: Negative.   Respiratory: Negative.  Negative for cough and shortness of breath.   Cardiovascular: Negative.  Negative for chest pain, palpitations and leg swelling.  Gastrointestinal: Negative.  Negative for abdominal pain, blood in stool, constipation, diarrhea, heartburn, melena, nausea and vomiting.  Genitourinary: Negative.  Negative for dysuria and flank pain.  Musculoskeletal: Negative.  Negative for back pain, joint pain, myalgias and neck pain.  Skin: Negative.   Neurological: Negative.  Negative for dizziness, tingling, tremors and headaches.  Endo/Heme/Allergies: Negative.   Psychiatric/Behavioral: Negative.  Negative for depression and suicidal  ideas. The patient is not nervous/anxious.        Objective:   BP 122/76   Pulse 80   Ht 5\' 8"  (1.727 m)   Wt 176 lb 3.2 oz (79.9 kg)   LMP 01/04/2018   SpO2 99%   BMI 26.79 kg/m   Vitals:   01/18/24 1046  BP: 122/76  Pulse: 80  Height: 5\' 8"  (1.727 m)  Weight: 176 lb 3.2 oz (79.9 kg)  SpO2: 99%  BMI (Calculated): 26.8    Physical Exam Vitals and nursing note reviewed.  Constitutional:      Appearance: Normal appearance.  HENT:     Head: Normocephalic and atraumatic.     Nose: Nose normal.     Mouth/Throat:     Mouth: Mucous membranes are moist.     Pharynx: Oropharynx is clear.  Eyes:     Conjunctiva/sclera: Conjunctivae normal.     Pupils: Pupils are equal, round, and reactive to light.  Cardiovascular:     Rate and Rhythm: Normal rate and regular rhythm.     Pulses: Normal pulses.     Heart sounds: Normal heart sounds. No murmur heard. Pulmonary:     Effort: Pulmonary effort is normal.     Breath sounds: Normal breath sounds. No wheezing.  Abdominal:     General: Bowel sounds are normal.     Palpations: Abdomen is soft.     Tenderness: There is no abdominal tenderness. There is no right CVA tenderness or left CVA tenderness.  Musculoskeletal:        General: Normal range of motion.     Cervical back: Normal range of motion.     Right lower leg: No edema.     Left lower leg: No edema.  Skin:    General: Skin is warm and dry.  Neurological:     General: No focal deficit present.     Mental Status: She is alert and oriented to person, place, and time.  Psychiatric:        Mood and Affect: Mood normal.        Behavior: Behavior normal.      No results found for any visits on 01/18/24.  Recent Results (from the past 2160 hours)  VITAMIN D 25 Hydroxy (Vit-D Deficiency, Fractures)     Status: None   Collection Time: 11/18/23  9:25 AM  Result Value Ref Range   Vit D, 25-Hydroxy 72.35 30 - 100 ng/mL    Comment: (NOTE) Vitamin D deficiency has been  defined by the Institute of Medicine  and an Endocrine Society practice guideline as a level of serum 25-OH  vitamin D less than 20 ng/mL (1,2). The Endocrine Society went on to  further define vitamin D insufficiency as a level between 21 and 29  ng/mL (2).  1. IOM (Institute of Medicine). 2010. Dietary reference intakes for  calcium and D. [place]: [employer]. 2. Holick MF, Binkley Wallace, Bischoff-Ferrari HA, et al. Evaluation,  treatment, and prevention of vitamin D deficiency: an Endocrine  Society clinical practice guideline, JCEM. 2011 Jul; 96(7): 1911-30.  Performed at [facility], [address], [place], [place] [address]   CMP (Cancer Center only)     Status: Abnormal   Collection Time: 11/18/23  9:25 AM  Result Value Ref Range   Sodium 141 135 - 145 mmol/L   Potassium 3.5 3.5 - 5.1 mmol/L   Chloride 107 98 - 111 mmol/L   CO2 26 22 - 32 mmol/L   Glucose, Bld 108 (H) 70 - 99 mg/dL    Comment: Glucose reference range applies only to samples taken after fasting for at least 8 hours.   BUN 17 6 - 20 mg/dL   Creatinine 9.14 7.82 - 1.00 mg/dL   Calcium 9.0 8.9 - 95.6 mg/dL   Total Protein 7.0 6.5 - 8.1 g/dL   Albumin 4.2 3.5 - 5.0 g/dL   AST 21 15 - 41 U/L   ALT 23 0 - 44 U/L   Alkaline Phosphatase 67 38 - 126 U/L   Total Bilirubin 0.5 0.0 - 1.2 mg/dL   GFR, Estimated >21 >30 mL/min    Comment: (NOTE) Calculated using the CKD-EPI Creatinine Equation (2021)    Anion gap 8 5 - 15    Comment: Performed at [facility], [address] [place], [place] [address]  CBC with Differential (Cancer Center Only)     Status: Abnormal   Collection Time: 11/18/23  9:25 AM  Result Value Ref Range   WBC Count 3.7 (L) 4.0 - 10.5 K/uL   RBC 4.71 3.87 - 5.11 MIL/uL   Hemoglobin 12.7 12.0 - 15.0 g/dL   HCT 28.4 13.2 - 44.0 %   MCV 82.4 80.0 - 100.0 fL   MCH 27.0 26.0 - 34.0 pg   MCHC 32.7 30.0 - 36.0 g/dL   RDW 10.2 72.5 - 36.6 %   Platelet Count  267 150 - 400 K/uL   nRBC 0.0 0.0 - 0.2 %   Neutrophils Relative % 49 %   Neutro Abs 1.8 1.7 - 7.7 K/uL   Lymphocytes Relative 38 %   Lymphs Abs 1.4 0.7 - 4.0 K/uL   Monocytes Relative 8 %   Monocytes Absolute 0.3 0.1 - 1.0 K/uL   Eosinophils Relative 4 %   Eosinophils Absolute 0.1 0.0 - 0.5 K/uL   Basophils Relative 1 %   Basophils Absolute 0.0 0.0 - 0.1 K/uL   Immature Granulocytes 0 %   Abs Immature Granulocytes 0.01 0.00 - 0.07 K/uL    Comment: Performed at [facility], [address], [place], [place] [address]      Assessment & Plan:  To continue all meds as prescribed.  Check labs today. Problem List Items Addressed This Visit     Essential hypertension, benign   Relevant Orders   CMP14+EGFR   Mixed hyperlipidemia   Relevant Orders   Lipid Panel w/o Chol/HDL Ratio   Prediabetes   Relevant Orders   Hemoglobin A1c   Other Visit Diagnoses       Annual physical exam    -  Primary       Return in about 4 months (around 05/19/2024).   Total time spent: 30 minutes  [name], MD  01/18/2024   This document may have been prepared by [facility] Voice Recognition software and as such may include unintentional dictation errors.

## 2024-01-19 ENCOUNTER — Encounter: Payer: Self-pay | Admitting: Internal Medicine

## 2024-01-19 LAB — HEMOGLOBIN A1C
Est. average glucose Bld gHb Est-mCnc: 126 mg/dL
Hgb A1c MFr Bld: 6 % — ABNORMAL HIGH (ref 4.8–5.6)

## 2024-01-19 LAB — CMP14+EGFR
ALT: 19 IU/L (ref 0–32)
AST: 21 IU/L (ref 0–40)
Albumin: 4.5 g/dL (ref 3.8–4.9)
Alkaline Phosphatase: 89 IU/L (ref 44–121)
BUN/Creatinine Ratio: 16 (ref 9–23)
BUN: 14 mg/dL (ref 6–24)
Bilirubin Total: 0.5 mg/dL (ref 0.0–1.2)
CO2: 23 mmol/L (ref 20–29)
Calcium: 9.5 mg/dL (ref 8.7–10.2)
Chloride: 104 mmol/L (ref 96–106)
Creatinine, Ser: 0.87 mg/dL (ref 0.57–1.00)
Globulin, Total: 2.2 g/dL (ref 1.5–4.5)
Glucose: 81 mg/dL (ref 70–99)
Potassium: 4.4 mmol/L (ref 3.5–5.2)
Sodium: 140 mmol/L (ref 134–144)
Total Protein: 6.7 g/dL (ref 6.0–8.5)
eGFR: 77 mL/min/{1.73_m2} (ref >59)

## 2024-01-19 LAB — LIPID PANEL W/O CHOL/HDL RATIO
Cholesterol, Total: 191 mg/dL (ref 100–199)
HDL: 56 mg/dL (ref >39)
LDL Chol Calc (NIH): 118 mg/dL — ABNORMAL HIGH (ref 0–99)
Triglycerides: 92 mg/dL (ref 0–149)
VLDL Cholesterol Cal: 17 mg/dL (ref 5–40)

## 2024-01-27 DIAGNOSIS — M1711 Unilateral primary osteoarthritis, right knee: Secondary | ICD-10-CM | POA: Diagnosis not present

## 2024-01-27 DIAGNOSIS — J301 Allergic rhinitis due to pollen: Secondary | ICD-10-CM | POA: Diagnosis not present

## 2024-01-27 DIAGNOSIS — M25561 Pain in right knee: Secondary | ICD-10-CM | POA: Diagnosis not present

## 2024-02-02 NOTE — Progress Notes (Signed)
 Patient notified

## 2024-02-03 DIAGNOSIS — J301 Allergic rhinitis due to pollen: Secondary | ICD-10-CM | POA: Diagnosis not present

## 2024-02-08 DIAGNOSIS — J301 Allergic rhinitis due to pollen: Secondary | ICD-10-CM | POA: Diagnosis not present

## 2024-02-10 DIAGNOSIS — M1711 Unilateral primary osteoarthritis, right knee: Secondary | ICD-10-CM | POA: Diagnosis not present

## 2024-02-10 DIAGNOSIS — J301 Allergic rhinitis due to pollen: Secondary | ICD-10-CM | POA: Diagnosis not present

## 2024-02-17 DIAGNOSIS — J301 Allergic rhinitis due to pollen: Secondary | ICD-10-CM | POA: Diagnosis not present

## 2024-02-24 DIAGNOSIS — J301 Allergic rhinitis due to pollen: Secondary | ICD-10-CM | POA: Diagnosis not present

## 2024-03-09 DIAGNOSIS — J301 Allergic rhinitis due to pollen: Secondary | ICD-10-CM | POA: Diagnosis not present

## 2024-03-16 DIAGNOSIS — J301 Allergic rhinitis due to pollen: Secondary | ICD-10-CM | POA: Diagnosis not present

## 2024-03-23 DIAGNOSIS — J301 Allergic rhinitis due to pollen: Secondary | ICD-10-CM | POA: Diagnosis not present

## 2024-03-30 DIAGNOSIS — J301 Allergic rhinitis due to pollen: Secondary | ICD-10-CM | POA: Diagnosis not present

## 2024-04-14 ENCOUNTER — Ambulatory Visit
Admission: RE | Admit: 2024-04-14 | Discharge: 2024-04-14 | Disposition: A | Discharge status: Home / Self Care | Source: Ambulatory Visit | Attending: Internal Medicine | Admitting: Internal Medicine

## 2024-04-14 DIAGNOSIS — Z170 Estrogen receptor positive status [ER+]: Secondary | ICD-10-CM | POA: Insufficient documentation

## 2024-04-14 DIAGNOSIS — C50512 Malignant neoplasm of lower-outer quadrant of left female breast: Secondary | ICD-10-CM | POA: Insufficient documentation

## 2024-04-14 DIAGNOSIS — Z1231 Encounter for screening mammogram for malignant neoplasm of breast: Secondary | ICD-10-CM | POA: Insufficient documentation

## 2024-05-19 ENCOUNTER — Encounter: Payer: Self-pay | Admitting: Internal Medicine

## 2024-05-19 ENCOUNTER — Ambulatory Visit: Admitting: Internal Medicine

## 2024-05-23 ENCOUNTER — Other Ambulatory Visit: Payer: 59

## 2024-05-23 ENCOUNTER — Ambulatory Visit: Payer: 59 | Admitting: Internal Medicine

## 2024-05-26 ENCOUNTER — Ambulatory Visit: Admitting: Internal Medicine

## 2024-06-13 ENCOUNTER — Inpatient Hospital Stay (HOSPITAL_BASED_OUTPATIENT_CLINIC_OR_DEPARTMENT_OTHER): Admitting: Internal Medicine

## 2024-06-13 ENCOUNTER — Inpatient Hospital Stay: Attending: Internal Medicine

## 2024-06-13 ENCOUNTER — Encounter: Payer: Self-pay | Admitting: Internal Medicine

## 2024-06-13 VITALS — BP 141/89 | HR 71 | Temp 97.2°F | Resp 20 | Ht 68.0 in | Wt 177.5 lb

## 2024-06-13 DIAGNOSIS — Z17410 Hormone receptor positive with human epidermal growth factor receptor 2 positive status: Secondary | ICD-10-CM | POA: Insufficient documentation

## 2024-06-13 DIAGNOSIS — Z170 Estrogen receptor positive status [ER+]: Secondary | ICD-10-CM

## 2024-06-13 DIAGNOSIS — C50512 Malignant neoplasm of lower-outer quadrant of left female breast: Secondary | ICD-10-CM | POA: Insufficient documentation

## 2024-06-13 DIAGNOSIS — Z79811 Long term (current) use of aromatase inhibitors: Secondary | ICD-10-CM | POA: Diagnosis not present

## 2024-06-13 DIAGNOSIS — D72819 Decreased white blood cell count, unspecified: Secondary | ICD-10-CM | POA: Diagnosis not present

## 2024-06-13 LAB — CBC WITH DIFFERENTIAL (CANCER CENTER ONLY)
Abs Immature Granulocytes: 0.02 K/uL (ref 0.00–0.07)
Basophils Absolute: 0 K/uL (ref 0.0–0.1)
Basophils Relative: 0 %
Eosinophils Absolute: 0.1 K/uL (ref 0.0–0.5)
Eosinophils Relative: 2 %
HCT: 39.4 % (ref 36.0–46.0)
Hemoglobin: 12.9 g/dL (ref 12.0–15.0)
Immature Granulocytes: 0 %
Lymphocytes Relative: 41 %
Lymphs Abs: 2.2 K/uL (ref 0.7–4.0)
MCH: 26.7 pg (ref 26.0–34.0)
MCHC: 32.7 g/dL (ref 30.0–36.0)
MCV: 81.4 fL (ref 80.0–100.0)
Monocytes Absolute: 0.5 K/uL (ref 0.1–1.0)
Monocytes Relative: 9 %
Neutro Abs: 2.6 K/uL (ref 1.7–7.7)
Neutrophils Relative %: 48 %
Platelet Count: 298 K/uL (ref 150–400)
RBC: 4.84 MIL/uL (ref 3.87–5.11)
RDW: 14 % (ref 11.5–15.5)
WBC Count: 5.3 K/uL (ref 4.0–10.5)
nRBC: 0 % (ref 0.0–0.2)

## 2024-06-13 LAB — CMP (CANCER CENTER ONLY)
ALT: 23 U/L (ref 0–44)
AST: 17 U/L (ref 15–41)
Albumin: 4.3 g/dL (ref 3.5–5.0)
Alkaline Phosphatase: 73 U/L (ref 38–126)
Anion gap: 7 (ref 5–15)
BUN: 19 mg/dL (ref 6–20)
CO2: 24 mmol/L (ref 22–32)
Calcium: 9.3 mg/dL (ref 8.9–10.3)
Chloride: 107 mmol/L (ref 98–111)
Creatinine: 0.7 mg/dL (ref 0.44–1.00)
GFR, Estimated: >60 mL/min (ref >60)
Glucose, Bld: 100 mg/dL — ABNORMAL HIGH (ref 70–99)
Potassium: 4 mmol/L (ref 3.5–5.1)
Sodium: 138 mmol/L (ref 135–145)
Total Bilirubin: 0.5 mg/dL (ref 0.0–1.2)
Total Protein: 7 g/dL (ref 6.5–8.1)

## 2024-06-13 LAB — VITAMIN D 25 HYDROXY (VIT D DEFICIENCY, FRACTURES): Vit D, 25-Hydroxy: 70.14 ng/mL (ref 30–100)

## 2024-06-13 MED ORDER — LETROZOLE 2.5 MG PO TABS: 2.5000 mg | ORAL_TABLET | Freq: Every day | ORAL | 1 refills | Status: DC

## 2024-06-13 NOTE — Progress Notes (Signed)
 Mammogram 04/14/24. Needs refill letrozole , pended.

## 2024-06-13 NOTE — Assessment & Plan Note (Signed)
#   APRIL 2022- Left breast- CA- s/p mastectomy-  pT1c pN0 [Stage IA]Grade 3.  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  S/p  adjuvant Herceptin  [april 2023]. STABLE;  s/p Adjuvant herceptin  [finished April 2023]. JUNE 2025- Diagnostic Mammogram Right Unilateral- WNL- stable.   # currently on Letrozole -  Tolerating well.  Continue letrozole .  # Musculoskeletal G-1- tolerating well Letrozole -  # 2022-BMD- 2024- T-score of -0.2. continue vit D-50,k/weeekly  stable  # Leucopenia-  likely benign ethnic-asymptomatic.  # IV access: PIV:   # DISPOSITION:  #  follow up in 6 month   MD; labs- cbc/cmp; vit D 25-OH level- -- Dr.B

## 2024-06-13 NOTE — Progress Notes (Signed)
 [facility] CONSULT NOTE  Patient Care Team: Fernand Fredy RAMAN, MD as PCP - General (Internal Medicine) Cindie Jesusa HERO, RN as Registered Nurse Rennie Judy SAUNDERS, MD as Consulting Physician (Internal Medicine) Lane Shope, MD as Consulting Physician (General Surgery) Lowery Estefana RAMAN, DO as Attending Physician (Plastic Surgery)  CHIEF COMPLAINTS/PURPOSE OF CONSULTATION: Breast cancer   Oncology History Overview Note  # April 2022-stage Ia mammary carcinoma ER/PR positive HER2 positive [TRIPLE positive]; negative margins s/p simple mastectomy with plan for immediate reconstruction [Dr.Rodenberg/[name]]; NO RT  # May 2nd, 2022- Taxol -Herceptin   # OCT 3rd, 2022- Anastrazole 1mg /day [AUG 2022- BMD- WNL];   # AUG 2023- DISCONTINUE  Anastrazole- sec to [facility] side effects; start Aromasin ; stopped in OCT 2024- sec to [facility].   # NOV 20th, 2024- start letrozole + chrondoitin biflex  # MUGA scan- 64% [03/06/2021]-   # LMP- mid 2020; Uterine ablation- [phone]; # HTN; Asthma- well controlled [on allergy shots];SABRA    Malignant neoplasm of lower-outer quadrant of left breast of female, estrogen receptor positive (HCC)  12/25/2020 Initial Diagnosis   Malignant neoplasm of lower-outer quadrant of left breast of female, estrogen receptor positive (HCC)   02/26/2021 Cancer Staging   Staging form: Breast, AJCC 8th Edition - Pathologic: Stage IA (pT1c, pN0, cM0, G3, ER+, PR+, HER2+) - Signed by Rennie Judy SAUNDERS, MD on 02/26/2021 Mitotic count score: Score 3 Histologic grading system: 3 grade system   03/10/2021 -  Chemotherapy   Patient is on Treatment Plan : BREAST Paclitaxel  + Trastuzumab  q7d / Trastuzumab  q21d      Genetic Testing   Negative genetic testing. No pathogenic variants identified on the [facility] Multi-Cancer+RNA panel. 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 The report date is 04/29/2021.  The Multi-Cancer Panel + RNA offered by [facility] includes sequencing  and/or deletion duplication testing of the following 84 genes: AIP, ALK, APC, ATM, AXIN2,BAP1,  BARD1, BLM, BMPR1A, BRCA1, BRCA2, BRIP1, CASR, CDC73, CDH1, CDK4, CDKN1B, CDKN1C, CDKN2A (p14ARF), CDKN2A (p16INK4a), CEBPA, CHEK2, CTNNA1, DICER1, DIS3L2, EGFR (c.2369C>T, p.Thr790Met variant only), EPCAM (Deletion/duplication testing only), FH, FLCN, GATA2, GPC3, GREM1 (Promoter region deletion/duplication testing only), HOXB13 (c.251G>A, p.Gly84Glu), HRAS, KIT, MAX, MEN1, MET, MITF (c.952G>A, p.Glu318Lys variant only), MLH1, MSH2, MSH3, MSH6, MUTYH, NBN, NF1, NF2, NTHL1, PALB2, PDGFRA, PHOX2B, PMS2, POLD1, POLE, POT1, PRKAR1A, PTCH1, PTEN, RAD50, RAD51C, RAD51D, RB1, RECQL4, RET, RUNX1, SDHAF2, SDHA (sequence changes only), SDHB, SDHC, SDHD, SMAD4, SMARCA4, SMARCB1, SMARCE1, STK11, SUFU, TERC, TERT, TMEM127, TP53, TSC1, TSC2, VHL, WRN and WT1.    HISTORY OF PRESENTING ILLNESS: Alone.  Ambulating independently.  Judy Padilla 58 y.o.  female newly diagnosed breast cancer stage I ER/PR positive HER2/neu positive s/p adjuvant Herceptin ; most recently on letrozole  is here for follow-up/mammogram.   Patient seem to be tolerating letrozole  well. Compliant with letrozole .   No worsening hot flashes.  No constipation or diarrhea.  Review of Systems  Constitutional:  Negative for chills, diaphoresis, fever, malaise/fatigue and weight loss.  HENT:  Negative for nosebleeds and sore throat.   Eyes:  Negative for double vision.  Respiratory:  Negative for cough, hemoptysis, sputum production, shortness of breath and wheezing.   Cardiovascular:  Negative for chest pain, palpitations, orthopnea and leg swelling.  Gastrointestinal:  Negative for abdominal pain, blood in stool, constipation, diarrhea, heartburn, melena, nausea and vomiting.  Genitourinary:  Negative for dysuria, frequency and urgency.  Musculoskeletal:  Negative for back pain and joint pain.  Skin: Negative.  Negative for itching and rash.   Neurological:  Negative for dizziness, tingling, focal weakness, weakness and headaches.  Endo/Heme/Allergies:  Does not bruise/bleed easily.  Psychiatric/Behavioral:  Negative for depression. The patient is not nervous/anxious and does not have insomnia.      MEDICAL HISTORY:  Past Medical History:  Diagnosis Date   Asthma    well controlled   Breast cancer (HCC)    Family history of adverse reaction to anesthesia    sister-hard time waking up   Family history of breast cancer    Family history of prostate cancer    Family history of uterine cancer    GERD (gastroesophageal reflux disease)    Hypertension    Personal history of chemotherapy     SURGICAL HISTORY: Past Surgical History:  Procedure Laterality Date   ABLATION     BREAST BIOPSY Left 2011   Benign per pt   BREAST BIOPSY Left 12/12/2020   3:30 3 cmfn, Q marker, pos   BREAST BIOPSY Left 12/12/2020   3:30 1 cmfn, Vision marker, pos   BREAST RECONSTRUCTION WITH PLACEMENT OF TISSUE EXPANDER AND FLEX HD (ACELLULAR HYDRATED DERMIS) Left 02/10/2021   Procedure: IMMEDIATE LEFT BREAST RECONSTRUCTION WITH PLACEMENT OF TISSUE EXPANDER AND FLEX HD (ACELLULAR HYDRATED DERMIS);  Surgeon: Lowery Estefana RAMAN, DO;  Location: [facility];  Service: Plastics;  Laterality: Left;   COLONOSCOPY  01/15/2020   MASTECTOMY Left 2022   PORTACATH PLACEMENT Right 02/10/2021   Procedure: INSERTION PORT-A-CATH;  Surgeon: Lane Shope, MD;  Location: [facility];  Service: General;  Laterality: Right;   REMOVAL OF TISSUE EXPANDER AND PLACEMENT OF IMPLANT Left 07/09/2021   Procedure: REMOVAL OF TISSUE EXPANDER AND PLACEMENT OF IMPLANT LEFT BREAST;  Surgeon: Lowery Estefana RAMAN, DO;  Location: [facility];  Service: Plastics;  Laterality: Left;   SIMPLE MASTECTOMY WITH AXILLARY SENTINEL NODE BIOPSY Left 02/10/2021   Procedure: SIMPLE MASTECTOMY WITH AXILLARY SENTINEL NODE BIOPSY;  Surgeon: Lane Shope, MD;  Location: [facility];   Service: General;  Laterality: Left;    SOCIAL HISTORY: Social History   Socioeconomic History   Marital status: Married    Spouse name: Not on file   Number of children: Not on file   Years of education: Not on file   Highest education level: Not on file  Occupational History   Not on file  Tobacco Use   Smoking status: Never   Smokeless tobacco: Never  Vaping Use   Vaping status: Never Used  Substance and Sexual Activity   Alcohol use: Not Currently   Drug use: Never   Sexual activity: Not on file  Other Topics Concern   Not on file  Social History Narrative   Lives in [place] with husband; kids- college. Works for [employer]- working from home. No smoking or alcohol.    Social Drivers of Corporate investment banker Strain: Not on file  Food Insecurity: Not on file  Transportation Needs: Not on file  Physical Activity: Not on file  Stress: Not on file  Social Connections: Not on file  Intimate Partner Violence: Not on file    FAMILY HISTORY: Family History  Problem Relation Age of Onset   Hypertension Mother    Diabetes Mother    Hypertension Father    Diabetes Father    Cancer Father        prostate cancer-70s   Cancer Maternal Grandmother        uterine cancer   Breast cancer Sister        in in  60s.     ALLERGIES:  is allergic to shrimp extract and other.  MEDICATIONS:  Current Outpatient Medications  Medication Sig Dispense Refill   albuterol (VENTOLIN HFA) 108 (90 Base) MCG/ACT inhaler Inhale 1 puff into the lungs every 6 (six) hours as needed for shortness of breath.     amLODipine (NORVASC) 10 MG tablet TAKE 1 TABLET BY MOUTH EVERY DAY 90 tablet 3   DUPIXENT 300 MG/2ML SOPN Inject into the skin.     EPINEPHrine  0.3 mg/0.3 mL IJ SOAJ injection Inject 0.3 mg into the muscle as needed for anaphylaxis.     fluticasone (FLONASE) 50 MCG/ACT nasal spray Place 1 spray into both nostrils daily as needed for allergies.     Multiple  Vitamins-Minerals (CENTRUM ADULTS) TABS Take 1 tablet by mouth daily.     NON FORMULARY Takes weekly allergy shots     pantoprazole (PROTONIX) 40 MG tablet TAKE 1 TABLET BY MOUTH EVERY DAY 90 tablet 3   rosuvastatin (CRESTOR) 40 MG tablet TAKE 1 TABLET BY MOUTH EVERY DAY (Patient taking differently: Take 10 mg by mouth daily.) 90 tablet 3   budesonide (PULMICORT) 0.5 MG/2ML nebulizer solution Take 2 mLs by nebulization daily. (Patient not taking: Reported on 06/13/2024)     letrozole  (FEMARA ) 2.5 MG tablet Take 1 tablet (2.5 mg total) by mouth daily. 90 tablet 1   No current facility-administered medications for this visit.      SABRA  PHYSICAL EXAMINATION: ECOG PERFORMANCE STATUS: 0 - Asymptomatic  Vitals:   06/13/24 0936 06/13/24 0953  BP: (!) 129/93 (!) 141/89  Pulse: 71   Resp: 20   Temp: (!) 97.2 F (36.2 C)   SpO2: 100%    Filed Weights   06/13/24 0936  Weight: 177 lb 8 oz (80.5 kg)    Physical Exam HENT:     Head: Normocephalic and atraumatic.     Mouth/Throat:     Pharynx: No oropharyngeal exudate.  Eyes:     Pupils: Pupils are equal, round, and reactive to light.  Cardiovascular:     Rate and Rhythm: Normal rate and regular rhythm.  Pulmonary:     Effort: Pulmonary effort is normal. No respiratory distress.     Breath sounds: Normal breath sounds. No wheezing.  Abdominal:     General: Bowel sounds are normal. There is no distension.     Palpations: Abdomen is soft. There is no mass.     Tenderness: There is no abdominal tenderness. There is no guarding or rebound.  Musculoskeletal:        General: No tenderness. Normal range of motion.     Cervical back: Normal range of motion and neck supple.  Skin:    General: Skin is warm.  Neurological:     Mental Status: She is alert and oriented to person, place, and time.  Psychiatric:        Mood and Affect: Affect normal.      LABORATORY DATA:  I have reviewed the data as listed Lab Results  Component Value  Date   WBC 5.3 06/13/2024   HGB 12.9 06/13/2024   HCT 39.4 06/13/2024   MCV 81.4 06/13/2024   PLT 298 06/13/2024   Recent Labs    11/18/23 0925 01/18/24 1119 06/13/24 0936  NA 141 140 138  K 3.5 4.4 4.0  CL 107 104 107  CO2 26 23 24   GLUCOSE 108* 81 100*  BUN 17 14 19   CREATININE 0.84 0.87 0.70  CALCIUM 9.0  9.5 9.3  GFRNONAA >60  --  >60  PROT 7.0 6.7 7.0  ALBUMIN 4.2 4.5 4.3  AST 21 21 17   ALT 23 19 23   ALKPHOS 67 89 73  BILITOT 0.5 0.5 0.5    RADIOGRAPHIC STUDIES: I have personally reviewed the radiological images as listed and agreed with the findings in the report. No results found.  ASSESSMENT & PLAN:   Malignant neoplasm of lower-outer quadrant of left breast of female, estrogen receptor positive (HCC) # APRIL 2022- Left breast- CA- s/p mastectomy-  pT1c pN0 [Stage IA]Grade 3.  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  S/p  adjuvant Herceptin  [april 2023]. STABLE;  s/p Adjuvant herceptin  [finished April 2023]. JUNE 2025- Diagnostic Mammogram Right Unilateral- WNL- stable.   # currently on Letrozole -  Tolerating well.  Continue letrozole .  # Musculoskeletal G-1- tolerating well Letrozole -  # 2022-BMD- 2024- T-score of -0.2. continue vit D-50,k/weeekly  stable  # Leucopenia-  likely benign ethnic-asymptomatic.  # IV access: PIV:   # DISPOSITION:  #  follow up in 6 month   MD; labs- cbc/cmp; vit D 25-OH level- -- Dr.B      All questions were answered. The patient/family knows to call the clinic with any problems, questions or concerns.    Judy JONELLE Joe, MD 06/13/2024 10:22 AM

## 2024-06-14 ENCOUNTER — Other Ambulatory Visit: Payer: Self-pay

## 2024-06-24 ENCOUNTER — Other Ambulatory Visit: Payer: Self-pay | Admitting: Internal Medicine

## 2024-06-24 DIAGNOSIS — E782 Mixed hyperlipidemia: Secondary | ICD-10-CM

## 2024-06-27 ENCOUNTER — Encounter: Payer: Self-pay | Admitting: Internal Medicine

## 2024-06-27 ENCOUNTER — Ambulatory Visit (INDEPENDENT_AMBULATORY_CARE_PROVIDER_SITE_OTHER): Admitting: Internal Medicine

## 2024-06-27 VITALS — BP 138/88 | HR 77 | Ht 68.0 in | Wt 175.8 lb

## 2024-06-27 DIAGNOSIS — E782 Mixed hyperlipidemia: Secondary | ICD-10-CM | POA: Diagnosis not present

## 2024-06-27 DIAGNOSIS — Z170 Estrogen receptor positive status [ER+]: Secondary | ICD-10-CM

## 2024-06-27 DIAGNOSIS — I10 Essential (primary) hypertension: Secondary | ICD-10-CM

## 2024-06-27 DIAGNOSIS — J4540 Moderate persistent asthma, uncomplicated: Secondary | ICD-10-CM

## 2024-06-27 DIAGNOSIS — C50512 Malignant neoplasm of lower-outer quadrant of left female breast: Secondary | ICD-10-CM

## 2024-06-27 DIAGNOSIS — R7302 Impaired glucose tolerance (oral): Secondary | ICD-10-CM | POA: Diagnosis not present

## 2024-06-27 NOTE — Progress Notes (Signed)
 Established Patient Office Visit  Subjective:  Patient ID: Judy Padilla, female    DOB: [birth year]  Age: 58 y.o. MRN: [id number]  Chief Complaint  Patient presents with  . Follow-up    4 month follow up    Patient is here for 4 month FU visit. Reports she is feeling well today and has no complaints. Asthma is under very good control since started Dupixent.She is taking all her medications regularly. Denies any chest pain, shortness of breath, palpitations, headaches, nausea, vomiting, or diarrhea.    No other concerns at this time.   Past Medical History:  Diagnosis Date  . Asthma    well controlled  . Breast cancer (HCC)   . Family history of adverse reaction to anesthesia    sister-hard time waking up  . Family history of breast cancer   . Family history of prostate cancer   . Family history of uterine cancer   . GERD (gastroesophageal reflux disease)   . Hypertension   . Personal history of chemotherapy     Past Surgical History:  Procedure Laterality Date  . ABLATION    . BREAST BIOPSY Left 2011   Benign per pt  . BREAST BIOPSY Left 12/12/2020   3:30 3 cmfn, Q marker, pos  . BREAST BIOPSY Left 12/12/2020   3:30 1 cmfn, Vision marker, pos  . BREAST RECONSTRUCTION WITH PLACEMENT OF TISSUE EXPANDER AND FLEX HD (ACELLULAR HYDRATED DERMIS) Left 02/10/2021   Procedure: IMMEDIATE LEFT BREAST RECONSTRUCTION WITH PLACEMENT OF TISSUE EXPANDER AND FLEX HD (ACELLULAR HYDRATED DERMIS);  Surgeon: Lowery Estefana RAMAN, DO;  Location: [facility];  Service: Plastics;  Laterality: Left;  . COLONOSCOPY  01/15/2020  . MASTECTOMY Left 2022  . PORTACATH PLACEMENT Right 02/10/2021   Procedure: INSERTION PORT-A-CATH;  Surgeon: Lane Shope, MD;  Location: [facility];  Service: General;  Laterality: Right;  . REMOVAL OF TISSUE EXPANDER AND PLACEMENT OF IMPLANT Left 07/09/2021   Procedure: REMOVAL OF TISSUE EXPANDER AND PLACEMENT OF IMPLANT LEFT BREAST;  Surgeon: Lowery Estefana RAMAN, DO;  Location: [facility];  Service: Plastics;  Laterality: Left;  . SIMPLE MASTECTOMY WITH AXILLARY SENTINEL NODE BIOPSY Left 02/10/2021   Procedure: SIMPLE MASTECTOMY WITH AXILLARY SENTINEL NODE BIOPSY;  Surgeon: Lane Shope, MD;  Location: [facility];  Service: General;  Laterality: Left;    Social History   Socioeconomic History  . Marital status: Married    Spouse name: Not on file  . Number of children: Not on file  . Years of education: Not on file  . Highest education level: Not on file  Occupational History  . Not on file  Tobacco Use  . Smoking status: Never  . Smokeless tobacco: Never  Vaping Use  . Vaping status: Never Used  Substance and Sexual Activity  . Alcohol use: Not Currently  . Drug use: Never  . Sexual activity: Not on file  Other Topics Concern  . Not on file  Social History Narrative   Lives in [place] with husband; kids- college. Works for [employer]- working from home. No smoking or alcohol.    Social Drivers of Corporate investment banker Strain: Not on file  Food Insecurity: Not on file  Transportation Needs: Not on file  Physical Activity: Not on file  Stress: Not on file  Social Connections: Not on file  Intimate Partner Violence: Not on file    Family History  Problem Relation Age of Onset  . Hypertension Mother   .  Diabetes Mother   . Hypertension Father   . Diabetes Father   . Cancer Father        prostate cancer-70s  . Cancer Maternal Grandmother        uterine cancer  . Breast cancer Sister        in in 59s.     Allergies  Allergen Reactions  . Shrimp Extract Shortness Of Breath  . Other Swelling    Cannot take Excedrin     Outpatient Medications Prior to Visit  Medication Sig  . albuterol (VENTOLIN HFA) 108 (90 Base) MCG/ACT inhaler Inhale 1 puff into the lungs every 6 (six) hours as needed for shortness of breath.  SABRA amLODipine (NORVASC) 10 MG tablet TAKE 1 TABLET BY MOUTH  EVERY DAY  . DUPIXENT 300 MG/2ML SOPN Inject into the skin.  . EPINEPHrine  0.3 mg/0.3 mL IJ SOAJ injection Inject 0.3 mg into the muscle as needed for anaphylaxis.  . fluticasone (FLONASE) 50 MCG/ACT nasal spray Place 1 spray into both nostrils daily as needed for allergies.  . letrozole  (FEMARA ) 2.5 MG tablet Take 1 tablet (2.5 mg total) by mouth daily.  . Multiple Vitamins-Minerals (CENTRUM ADULTS) TABS Take 1 tablet by mouth daily.  . NON FORMULARY Takes weekly allergy shots  . pantoprazole (PROTONIX) 40 MG tablet TAKE 1 TABLET BY MOUTH EVERY DAY  . rosuvastatin (CRESTOR) 40 MG tablet TAKE 1 TABLET BY MOUTH EVERY DAY (Patient taking differently: Take 20 mg by mouth daily.)  . budesonide (PULMICORT) 0.5 MG/2ML nebulizer solution Take 2 mLs by nebulization daily. (Patient not taking: Reported on 06/27/2024)   No facility-administered medications prior to visit.    Review of Systems  Constitutional: Negative.   HENT: Negative.    Eyes: Negative.   Respiratory: Negative.  Negative for cough, shortness of breath and wheezing.   Cardiovascular: Negative.  Negative for chest pain, palpitations and leg swelling.  Gastrointestinal: Negative.  Negative for abdominal pain, constipation, diarrhea, heartburn, nausea and vomiting.  Genitourinary: Negative.  Negative for dysuria and flank pain.  Musculoskeletal: Negative.  Negative for joint pain and myalgias.  Skin: Negative.   Neurological: Negative.  Negative for dizziness and headaches.  Endo/Heme/Allergies: Negative.   Psychiatric/Behavioral: Negative.  Negative for depression and suicidal ideas. The patient is not nervous/anxious.        Objective:   BP 138/88   Pulse 77   Ht 5' 8 (1.727 m)   Wt 175 lb 12.8 oz (79.7 kg)   LMP 01/04/2018   SpO2 99%   BMI 26.73 kg/m   Vitals:   06/27/24 0957  BP: 138/88  Pulse: 77  Height: 5' 8 (1.727 m)  Weight: 175 lb 12.8 oz (79.7 kg)  SpO2: 99%  BMI (Calculated): 26.74    Physical  Exam Vitals and nursing note reviewed.  Constitutional:      Appearance: Normal appearance.  HENT:     Head: Normocephalic and atraumatic.     Nose: Nose normal.     Mouth/Throat:     Mouth: Mucous membranes are moist.     Pharynx: Oropharynx is clear.  Eyes:     Conjunctiva/sclera: Conjunctivae normal.     Pupils: Pupils are equal, round, and reactive to light.  Cardiovascular:     Rate and Rhythm: Normal rate and regular rhythm.     Pulses: Normal pulses.     Heart sounds: Normal heart sounds. No murmur heard. Pulmonary:     Effort: Pulmonary effort is normal.  Breath sounds: Normal breath sounds. No wheezing.  Abdominal:     General: Bowel sounds are normal.     Palpations: Abdomen is soft.     Tenderness: There is no abdominal tenderness. There is no right CVA tenderness or left CVA tenderness.  Musculoskeletal:        General: Normal range of motion.     Cervical back: Normal range of motion.     Right lower leg: No edema.     Left lower leg: No edema.  Skin:    General: Skin is warm and dry.     Capillary Refill: Capillary refill takes less than 2 seconds.  Neurological:     General: No focal deficit present.     Mental Status: She is alert and oriented to person, place, and time.  Psychiatric:        Mood and Affect: Mood normal.        Behavior: Behavior normal.    No results found for any visits on 06/27/24.  Recent Results (from the past 2160 hours)  VITAMIN D  25 Hydroxy (Vit-D Deficiency, Fractures)     Status: None   Collection Time: 06/13/24  9:36 AM  Result Value Ref Range   Vit D, 25-Hydroxy 70.14 30 - 100 ng/mL    Comment: (NOTE) Vitamin D  deficiency has been defined by the Institute of Medicine  and an Endocrine Society practice guideline as a level of serum 25-OH  vitamin D  less than 20 ng/mL (1,2). The Endocrine Society went on to  further define vitamin D  insufficiency as a level between 21 and 29  ng/mL (2).  1. IOM (Institute of  Medicine). 2010. Dietary reference intakes for  calcium and D. [place]: [employer]. 2. Holick MF, Binkley Salisbury, Bischoff-Ferrari HA, et al. Evaluation,  treatment, and prevention of vitamin D  deficiency: an Endocrine  Society clinical practice guideline, JCEM. 2011 Jul; 96(7): 1911-30.  Performed at [facility], [address], [place], [place] [address]   CMP (Cancer Center only)     Status: Abnormal   Collection Time: 06/13/24  9:36 AM  Result Value Ref Range   Sodium 138 135 - 145 mmol/L   Potassium 4.0 3.5 - 5.1 mmol/L   Chloride 107 98 - 111 mmol/L   CO2 24 22 - 32 mmol/L   Glucose, Bld 100 (H) 70 - 99 mg/dL    Comment: Glucose reference range applies only to samples taken after fasting for at least 8 hours.   BUN 19 6 - 20 mg/dL   Creatinine 9.29 9.55 - 1.00 mg/dL   Calcium 9.3 8.9 - 89.6 mg/dL   Total Protein 7.0 6.5 - 8.1 g/dL   Albumin 4.3 3.5 - 5.0 g/dL   AST 17 15 - 41 U/L   ALT 23 0 - 44 U/L   Alkaline Phosphatase 73 38 - 126 U/L   Total Bilirubin 0.5 0.0 - 1.2 mg/dL   GFR, Estimated >39 >39 mL/min    Comment: (NOTE) Calculated using the CKD-EPI Creatinine Equation (2021)    Anion gap 7 5 - 15    Comment: Performed at [facility], [address] [place], [place] [address]  CBC with Differential (Cancer Center Only)     Status: None   Collection Time: 06/13/24  9:36 AM  Result Value Ref Range   WBC Count 5.3 4.0 - 10.5 K/uL   RBC 4.84 3.87 - 5.11 MIL/uL   Hemoglobin 12.9 12.0 - 15.0 g/dL  HCT 39.4 36.0 - 46.0 %   MCV 81.4 80.0 - 100.0 fL   MCH 26.7 26.0 - 34.0 pg   MCHC 32.7 30.0 - 36.0 g/dL   RDW 85.9 88.4 - 84.4 %   Platelet Count 298 150 - 400 K/uL   nRBC 0.0 0.0 - 0.2 %   Neutrophils Relative % 48 %   Neutro Abs 2.6 1.7 - 7.7 K/uL   Lymphocytes Relative 41 %   Lymphs Abs 2.2 0.7 - 4.0 K/uL   Monocytes Relative 9 %   Monocytes Absolute 0.5 0.1 - 1.0 K/uL   Eosinophils Relative 2 %   Eosinophils Absolute 0.1 0.0  - 0.5 K/uL   Basophils Relative 0 %   Basophils Absolute 0.0 0.0 - 0.1 K/uL   Immature Granulocytes 0 %   Abs Immature Granulocytes 0.02 0.00 - 0.07 K/uL    Comment: Performed at [facility], [address] [place], [place] [address]      Assessment & Plan:  Continue taking medications as prescribed. Routine labs to be collected and patient to return in about 4 months. Onisha was seen today for follow-up.  Diagnoses and all orders for this visit:  Essential hypertension, benign -     CMP14+EGFR -     CBC with Diff  Mixed hyperlipidemia -     Lipid Panel w/o Chol/HDL Ratio  Impaired glucose tolerance -     Hemoglobin A1c  Moderate persistent asthma without complication  Malignant neoplasm of lower-outer quadrant of left breast of female, estrogen receptor positive (HCC)    Return in about 4 months (around 10/27/2024).     Total time spent: 30 minutes  FERNAND FREDY RAMAN, MD  06/27/2024   This document may have been prepared by [facility] Voice Recognition software and as such may include unintentional dictation errors.

## 2024-06-28 ENCOUNTER — Encounter: Payer: Self-pay | Admitting: Internal Medicine

## 2024-06-28 LAB — CBC WITH DIFFERENTIAL/PLATELET
Basophils Absolute: 0 x10E3/uL (ref 0.0–0.2)
Basos: 0 %
EOS (ABSOLUTE): 0.1 x10E3/uL (ref 0.0–0.4)
Eos: 2 %
Hematocrit: 62 % — ABNORMAL HIGH (ref 34.0–46.6)
Hemoglobin: 19.6 g/dL — ABNORMAL HIGH (ref 11.1–15.9)
Immature Grans (Abs): 0 x10E3/uL (ref 0.0–0.1)
Immature Granulocytes: 0 %
Lymphocytes Absolute: 2.2 x10E3/uL (ref 0.7–3.1)
Lymphs: 50 %
MCH: 27.1 pg (ref 26.6–33.0)
MCHC: 31.6 g/dL (ref 31.5–35.7)
MCV: 86 fL (ref 79–97)
Monocytes Absolute: 0.4 x10E3/uL (ref 0.1–0.9)
Monocytes: 9 %
Neutrophils Absolute: 1.7 x10E3/uL (ref 1.4–7.0)
Neutrophils: 39 %
Platelets: 172 x10E3/uL (ref 150–450)
RBC: 7.24 x10E6/uL (ref 3.77–5.28)
RDW: 15.8 % — ABNORMAL HIGH (ref 11.7–15.4)
WBC: 4.5 x10E3/uL (ref 3.4–10.8)

## 2024-06-28 LAB — CMP14+EGFR
ALT: 17 IU/L (ref 0–32)
AST: 17 IU/L (ref 0–40)
Albumin: 4.4 g/dL (ref 3.8–4.9)
Alkaline Phosphatase: 87 IU/L (ref 44–121)
BUN/Creatinine Ratio: 22 (ref 12–28)
BUN: 19 mg/dL (ref 8–27)
Bilirubin Total: 0.4 mg/dL (ref 0.0–1.2)
CO2: 23 mmol/L (ref 20–29)
Calcium: 9.6 mg/dL (ref 8.7–10.3)
Chloride: 107 mmol/L — ABNORMAL HIGH (ref 96–106)
Creatinine, Ser: 0.86 mg/dL (ref 0.57–1.00)
Globulin, Total: 2.2 g/dL (ref 1.5–4.5)
Glucose: 92 mg/dL (ref 70–99)
Potassium: 4.7 mmol/L (ref 3.5–5.2)
Sodium: 142 mmol/L (ref 134–144)
Total Protein: 6.6 g/dL (ref 6.0–8.5)
eGFR: 77 mL/min/1.73 (ref >59)

## 2024-06-28 LAB — LIPID PANEL W/O CHOL/HDL RATIO
Cholesterol, Total: 190 mg/dL (ref 100–199)
HDL: 64 mg/dL (ref >39)
LDL Chol Calc (NIH): 115 mg/dL — ABNORMAL HIGH (ref 0–99)
Triglycerides: 56 mg/dL (ref 0–149)
VLDL Cholesterol Cal: 11 mg/dL (ref 5–40)

## 2024-06-28 LAB — HEMOGLOBIN A1C
Est. average glucose Bld gHb Est-mCnc: 126 mg/dL
Hgb A1c MFr Bld: 6 % — ABNORMAL HIGH (ref 4.8–5.6)

## 2024-06-29 ENCOUNTER — Other Ambulatory Visit: Payer: Self-pay

## 2024-06-29 ENCOUNTER — Ambulatory Visit: Payer: Self-pay

## 2024-06-29 DIAGNOSIS — I10 Essential (primary) hypertension: Secondary | ICD-10-CM

## 2024-06-30 ENCOUNTER — Other Ambulatory Visit

## 2024-06-30 DIAGNOSIS — C50512 Malignant neoplasm of lower-outer quadrant of left female breast: Secondary | ICD-10-CM

## 2024-07-01 LAB — CBC WITH DIFFERENTIAL/PLATELET
Basophils Absolute: 0 x10E3/uL (ref 0.0–0.2)
Basos: 0 %
EOS (ABSOLUTE): 0.1 x10E3/uL (ref 0.0–0.4)
Eos: 3 %
Hematocrit: 40.2 % (ref 34.0–46.6)
Hemoglobin: 13 g/dL (ref 11.1–15.9)
Immature Grans (Abs): 0 x10E3/uL (ref 0.0–0.1)
Immature Granulocytes: 0 %
Lymphocytes Absolute: 2.1 x10E3/uL (ref 0.7–3.1)
Lymphs: 47 %
MCH: 27.1 pg (ref 26.6–33.0)
MCHC: 32.3 g/dL (ref 31.5–35.7)
MCV: 84 fL (ref 79–97)
Monocytes Absolute: 0.4 x10E3/uL (ref 0.1–0.9)
Monocytes: 10 %
Neutrophils Absolute: 1.8 x10E3/uL (ref 1.4–7.0)
Neutrophils: 40 %
Platelets: 246 x10E3/uL (ref 150–450)
RBC: 4.79 x10E6/uL (ref 3.77–5.28)
RDW: 14.3 % (ref 11.7–15.4)
WBC: 4.4 x10E3/uL (ref 3.4–10.8)

## 2024-07-01 LAB — FERRITIN: Ferritin: 28 ng/mL (ref 15–150)

## 2024-07-03 ENCOUNTER — Ambulatory Visit: Payer: Self-pay | Admitting: Internal Medicine

## 2024-07-28 ENCOUNTER — Encounter: Payer: Self-pay | Admitting: Plastic Surgery

## 2024-07-28 ENCOUNTER — Ambulatory Visit: Payer: 59 | Admitting: Plastic Surgery

## 2024-07-28 VITALS — BP 128/84 | HR 74 | Ht 68.0 in | Wt 174.2 lb

## 2024-07-28 DIAGNOSIS — N651 Disproportion of reconstructed breast: Secondary | ICD-10-CM | POA: Diagnosis not present

## 2024-07-28 DIAGNOSIS — Z08 Encounter for follow-up examination after completed treatment for malignant neoplasm: Secondary | ICD-10-CM | POA: Diagnosis not present

## 2024-07-28 DIAGNOSIS — Z9012 Acquired absence of left breast and nipple: Secondary | ICD-10-CM

## 2024-07-28 DIAGNOSIS — Z853 Personal history of malignant neoplasm of breast: Secondary | ICD-10-CM

## 2024-07-28 NOTE — Progress Notes (Signed)
 Patient ID: Judy Padilla, female    DOB: [birth year], 58 y.o.   MRN: [id number]   Chief Complaint  Patient presents with   Follow-up   Breast Problem    The patient is a 58 year old female here for follow-up after undergoing breast surgery.  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  In April 2022 she had a mastectomy and then reconstruction with expanders and implants.  By August 2022 she had 430 cc Mentor smooth round ultra high-profile gel implants placed.  She is 5 feet 8 inches tall.  She is pleased with her results and at this time not interested in a right mastopexy.  She might be interested in nipple areola tattooing.  She is still thinking about it.  Recent right breast mammogram was negative.    Review of Systems  Constitutional: Negative.   Eyes: Negative.   Respiratory: Negative.    Cardiovascular: Negative.   Gastrointestinal: Negative.   Endocrine: Negative.   Genitourinary: Negative.   Musculoskeletal: Negative.     Past Medical History:  Diagnosis Date   Asthma    well controlled   Breast cancer (HCC)    Family history of adverse reaction to anesthesia    sister-hard time waking up   Family history of breast cancer    Family history of prostate cancer    Family history of uterine cancer    GERD (gastroesophageal reflux disease)    Hypertension    Personal history of chemotherapy     Past Surgical History:  Procedure Laterality Date   ABLATION     BREAST BIOPSY Left 2011   Benign per pt   BREAST BIOPSY Left 12/12/2020   3:30 3 cmfn, Q marker, pos   BREAST BIOPSY Left 12/12/2020   3:30 1 cmfn, Vision marker, pos   BREAST RECONSTRUCTION WITH PLACEMENT OF TISSUE EXPANDER AND FLEX HD (ACELLULAR HYDRATED DERMIS) Left 02/10/2021   Procedure: IMMEDIATE LEFT BREAST RECONSTRUCTION WITH PLACEMENT OF TISSUE EXPANDER AND FLEX HD (ACELLULAR HYDRATED DERMIS);  Surgeon: Lowery Estefana RAMAN, DO;  Location: [facility];   Service: Plastics;  Laterality: Left;   COLONOSCOPY  01/15/2020   MASTECTOMY Left 2022   PORTACATH PLACEMENT Right 02/10/2021   Procedure: INSERTION PORT-A-CATH;  Surgeon: Lane Shope, MD;  Location: [facility];  Service: General;  Laterality: Right;   REMOVAL OF TISSUE EXPANDER AND PLACEMENT OF IMPLANT Left 07/09/2021   Procedure: REMOVAL OF TISSUE EXPANDER AND PLACEMENT OF IMPLANT LEFT BREAST;  Surgeon: Lowery Estefana RAMAN, DO;  Location: [facility];  Service: Plastics;  Laterality: Left;   SIMPLE MASTECTOMY WITH AXILLARY SENTINEL NODE BIOPSY Left 02/10/2021   Procedure: SIMPLE MASTECTOMY WITH AXILLARY SENTINEL NODE BIOPSY;  Surgeon: Lane Shope, MD;  Location: [facility];  Service: General;  Laterality: Left;      Current Outpatient Medications:    albuterol (VENTOLIN HFA) 108 (90 Base) MCG/ACT inhaler, Inhale 1 puff into the lungs every 6 (six) hours as needed for shortness of breath., Disp: , Rfl:    amLODipine (NORVASC) 10 MG tablet, TAKE 1 TABLET BY MOUTH EVERY DAY, Disp: 90 tablet, Rfl: 3   DUPIXENT 300 MG/2ML SOPN, Inject into the skin., Disp: , Rfl:    EPINEPHrine  0.3 mg/0.3 mL IJ SOAJ injection, Inject 0.3 mg into the muscle as needed for anaphylaxis., Disp: , Rfl:    fluticasone (FLONASE) 50 MCG/ACT nasal spray, Place 1 spray into both nostrils daily as needed for allergies., Disp: ,  Rfl:    letrozole  (FEMARA ) 2.5 MG tablet, Take 1 tablet (2.5 mg total) by mouth daily., Disp: 90 tablet, Rfl: 1   Multiple Vitamins-Minerals (CENTRUM ADULTS) TABS, Take 1 tablet by mouth daily., Disp: , Rfl:    NON FORMULARY, Takes weekly allergy shots, Disp: , Rfl:    pantoprazole (PROTONIX) 40 MG tablet, TAKE 1 TABLET BY MOUTH EVERY DAY, Disp: 90 tablet, Rfl: 3   rosuvastatin (CRESTOR) 40 MG tablet, TAKE 1 TABLET BY MOUTH EVERY DAY (Patient taking differently: Take 20 mg by mouth daily.), Disp: 90 tablet, Rfl: 3   budesonide (PULMICORT) 0.5 MG/2ML nebulizer solution, Take 2 mLs  by nebulization daily. (Patient not taking: Reported on 06/27/2024), Disp: , Rfl:    Objective:   Vitals:   07/28/24 0810  BP: 128/84  Pulse: 74  SpO2: 97%    Physical Exam Vitals reviewed.  Constitutional:      Appearance: Normal appearance.  HENT:     Head: Atraumatic.  Cardiovascular:     Rate and Rhythm: Normal rate.     Pulses: Normal pulses.  Pulmonary:     Effort: Pulmonary effort is normal.  Abdominal:     Palpations: Abdomen is soft.  Skin:    General: Skin is warm.     Capillary Refill: Capillary refill takes less than 2 seconds.  Neurological:     Mental Status: She is alert and oriented to person, place, and time.  Psychiatric:        Mood and Affect: Mood normal.        Behavior: Behavior normal.        Thought Content: Thought content normal.        Judgment: Judgment normal.     Assessment & Plan:  Breast asymmetry following reconstructive surgery  Acquired absence of left breast  Patient is a good candidate for nipple areola tattooing.  Is completely up to her and there is no time out so she can decide to do that at any time.  We can do that here in the office.  She is up-to-date on her mammogram for her right breast.  If she has any questions we can certainly do an ultrasound of the implant otherwise we will plan to see her back in 1 year.  Pictures were obtained of the patient and placed in the chart with the patient's or guardian's permission.   Estefana RAMAN Irais Mottram, DO

## 2024-07-29 ENCOUNTER — Other Ambulatory Visit: Payer: Self-pay

## 2024-09-15 ENCOUNTER — Other Ambulatory Visit: Payer: Self-pay | Admitting: Internal Medicine

## 2024-09-20 ENCOUNTER — Other Ambulatory Visit: Payer: Self-pay | Admitting: Internal Medicine

## 2024-10-27 ENCOUNTER — Encounter: Payer: Self-pay | Admitting: Internal Medicine

## 2024-10-27 ENCOUNTER — Ambulatory Visit: Admitting: Internal Medicine

## 2024-10-27 VITALS — BP 118/78 | HR 81 | Ht 68.0 in | Wt 174.0 lb

## 2024-10-27 DIAGNOSIS — J4540 Moderate persistent asthma, uncomplicated: Secondary | ICD-10-CM

## 2024-10-27 DIAGNOSIS — E782 Mixed hyperlipidemia: Secondary | ICD-10-CM

## 2024-10-27 DIAGNOSIS — I10 Essential (primary) hypertension: Secondary | ICD-10-CM

## 2024-10-27 DIAGNOSIS — R7303 Prediabetes: Secondary | ICD-10-CM

## 2024-10-27 DIAGNOSIS — R7302 Impaired glucose tolerance (oral): Secondary | ICD-10-CM

## 2024-10-27 NOTE — Progress Notes (Signed)
 "  Established Patient Office Visit  Subjective:  Patient ID: Judy Padilla, female    DOB: [birth year]  Age: 58 y.o. MRN: [id number]  Chief Complaint  Patient presents with   Follow-up    4 month follow up    Patient comes in for her follow-up today.  She is generally feeling well and has no new complaints.  Patient is taking all her medications regularly.  Her asthma is under good control with Dupixent injections, however she is not taking as prescribed, will start taking it regularly now.  She is fasting for her labs today. Patient is up-to-date with her mammograms. Last colonoscopy was 2021.    No other concerns at this time.   Past Medical History:  Diagnosis Date   Asthma    well controlled   Breast cancer (HCC)    Family history of adverse reaction to anesthesia    sister-hard time waking up   Family history of breast cancer    Family history of prostate cancer    Family history of uterine cancer    GERD (gastroesophageal reflux disease)    Hypertension    Personal history of chemotherapy     Past Surgical History:  Procedure Laterality Date   ABLATION     BREAST BIOPSY Left 2011   Benign per pt   BREAST BIOPSY Left 12/12/2020   3:30 3 cmfn, Q marker, pos   BREAST BIOPSY Left 12/12/2020   3:30 1 cmfn, Vision marker, pos   BREAST RECONSTRUCTION WITH PLACEMENT OF TISSUE EXPANDER AND FLEX HD (ACELLULAR HYDRATED DERMIS) Left 02/10/2021   Procedure: IMMEDIATE LEFT BREAST RECONSTRUCTION WITH PLACEMENT OF TISSUE EXPANDER AND FLEX HD (ACELLULAR HYDRATED DERMIS);  Surgeon: Lowery Estefana RAMAN, DO;  Location: [facility];  Service: Plastics;  Laterality: Left;   COLONOSCOPY  01/15/2020   MASTECTOMY Left 2022   PORTACATH PLACEMENT Right 02/10/2021   Procedure: INSERTION PORT-A-CATH;  Surgeon: Lane Shope, MD;  Location: [facility];  Service: General;  Laterality: Right;   REMOVAL OF TISSUE EXPANDER AND PLACEMENT OF IMPLANT Left 07/09/2021   Procedure: REMOVAL OF  TISSUE EXPANDER AND PLACEMENT OF IMPLANT LEFT BREAST;  Surgeon: Lowery Estefana RAMAN, DO;  Location: [facility];  Service: Plastics;  Laterality: Left;   SIMPLE MASTECTOMY WITH AXILLARY SENTINEL NODE BIOPSY Left 02/10/2021   Procedure: SIMPLE MASTECTOMY WITH AXILLARY SENTINEL NODE BIOPSY;  Surgeon: Lane Shope, MD;  Location: [facility];  Service: General;  Laterality: Left;    Social History   Socioeconomic History   Marital status: Married    Spouse name: Not on file   Number of children: Not on file   Years of education: Not on file   Highest education level: Not on file  Occupational History   Not on file  Tobacco Use   Smoking status: Never   Smokeless tobacco: Never  Vaping Use   Vaping status: Never Used  Substance and Sexual Activity   Alcohol use: Not Currently   Drug use: Never   Sexual activity: Not on file  Other Topics Concern   Not on file  Social History Narrative   Lives in [place] with husband; kids- college. Works for [employer]- working from home. No smoking or alcohol.    Social Drivers of Health   Tobacco Use: Low Risk (10/27/2024)   Patient History    Smoking Tobacco Use: Never    Smokeless Tobacco Use: Never    Passive Exposure: Not on file  Financial Resource Strain: Not  on file  Food Insecurity: Not on file  Transportation Needs: Not on file  Physical Activity: Not on file  Stress: Not on file  Social Connections: Not on file  Intimate Partner Violence: Not on file  Depression (PHQ2-9): Low Risk (06/13/2024)   Depression (PHQ2-9)    PHQ-2 Score: 0  Alcohol Screen: Not on file  Housing: Unknown (11/22/2023)   Received from [facility]   Epic    Unable to Pay for Housing in the Last Year: Not on file    Number of Times Moved in the Last Year: Not on file    At any time in the past 12 months, were you homeless or living in a shelter (including now)?: No  Utilities: Not on file  Health  Literacy: Not on file    Family History  Problem Relation Age of Onset   Hypertension Mother    Diabetes Mother    Hypertension Father    Diabetes Father    Cancer Father        prostate cancer-70s   Cancer Maternal Grandmother        uterine cancer   Breast cancer Sister        in in 65s.     Allergies[1]  Show/hide medication list[2]  Review of Systems  Constitutional: Negative.  Negative for chills, fever and malaise/fatigue.  HENT: Negative.  Negative for congestion and sore throat.   Eyes: Negative.  Negative for blurred vision and pain.  Respiratory: Negative.  Negative for cough and shortness of breath.   Cardiovascular: Negative.  Negative for chest pain, palpitations and leg swelling.  Gastrointestinal: Negative.  Negative for abdominal pain, blood in stool, constipation, diarrhea, heartburn, melena, nausea and vomiting.  Genitourinary: Negative.  Negative for dysuria, flank pain, frequency and urgency.  Musculoskeletal: Negative.  Negative for joint pain and myalgias.  Skin: Negative.   Neurological: Negative.  Negative for dizziness, tingling, sensory change, weakness and headaches.  Endo/Heme/Allergies: Negative.   Psychiatric/Behavioral: Negative.  Negative for depression and suicidal ideas. The patient is not nervous/anxious.        Objective:   BP 118/78   Pulse 81   Ht 5' 8 (1.727 m)   Wt 174 lb (78.9 kg)   LMP 01/04/2018   SpO2 99%   BMI 26.46 kg/m   Vitals:   10/27/24 0929  BP: 118/78  Pulse: 81  Height: 5' 8 (1.727 m)  Weight: 174 lb (78.9 kg)  SpO2: 99%  BMI (Calculated): 26.46    Physical Exam Vitals and nursing note reviewed.  Constitutional:      Appearance: Normal appearance.  HENT:     Head: Normocephalic and atraumatic.     Nose: Nose normal.     Mouth/Throat:     Mouth: Mucous membranes are moist.     Pharynx: Oropharynx is clear.  Eyes:     Conjunctiva/sclera: Conjunctivae normal.     Pupils: Pupils are equal, round,  and reactive to light.  Cardiovascular:     Rate and Rhythm: Normal rate and regular rhythm.     Pulses: Normal pulses.     Heart sounds: Normal heart sounds. No murmur heard. Pulmonary:     Effort: Pulmonary effort is normal.     Breath sounds: Normal breath sounds. No wheezing.  Abdominal:     General: Bowel sounds are normal.     Palpations: Abdomen is soft.     Tenderness: There is no abdominal tenderness. There is no right CVA  tenderness or left CVA tenderness.  Musculoskeletal:        General: Normal range of motion.     Cervical back: Normal range of motion.     Right lower leg: No edema.     Left lower leg: No edema.  Skin:    General: Skin is warm and dry.  Neurological:     General: No focal deficit present.     Mental Status: She is alert and oriented to person, place, and time.  Psychiatric:        Mood and Affect: Mood normal.        Behavior: Behavior normal.      No results found for any visits on 10/27/24.  No results found for this or any previous visit (from the past 2160 hours).    Assessment & Plan:  Continue all medications.  Check labs today. Problem List Items Addressed This Visit       Cardiovascular and Mediastinum   Essential hypertension, benign - Primary   Relevant Orders   CMP14+EGFR   CBC with Diff     Respiratory   Moderate persistent asthma without complication     Endocrine   Impaired glucose tolerance   Relevant Orders   Hemoglobin A1c     Other   Mixed hyperlipidemia   Relevant Orders   Lipid Panel w/o Chol/HDL Ratio   Prediabetes    Return in about 4 months (around 02/25/2025).   Total time spent: 30 minutes. This time includes review of previous notes and results and patient face to face interaction during today's visit.    FERNAND FREDY RAMAN, MD  10/27/2024   This document may have been prepared by [facility] Voice Recognition software and as such may include unintentional dictation errors.     [1]   Allergies Allergen Reactions   Shrimp Extract Shortness Of Breath   Other Swelling    Cannot take Excedrin   [2]  Outpatient Medications Prior to Visit  Medication Sig   albuterol (VENTOLIN HFA) 108 (90 Base) MCG/ACT inhaler Inhale 1 puff into the lungs every 6 (six) hours as needed for shortness of breath.   amLODipine (NORVASC) 10 MG tablet TAKE 1 TABLET BY MOUTH EVERY DAY   cetirizine (ZYRTEC) 10 MG tablet Take 10 mg by mouth daily.   DUPIXENT 300 MG/2ML SOPN Inject into the skin.   EPINEPHrine  0.3 mg/0.3 mL IJ SOAJ injection Inject 0.3 mg into the muscle as needed for anaphylaxis.   fluticasone (FLONASE) 50 MCG/ACT nasal spray Place 1 spray into both nostrils daily as needed for allergies.   letrozole  (FEMARA ) 2.5 MG tablet Take 1 tablet (2.5 mg total) by mouth daily.   Multiple Vitamins-Minerals (CENTRUM ADULTS) TABS Take 1 tablet by mouth daily.   pantoprazole (PROTONIX) 40 MG tablet TAKE 1 TABLET BY MOUTH EVERY DAY   rosuvastatin (CRESTOR) 40 MG tablet TAKE 1 TABLET BY MOUTH EVERY DAY (Patient taking differently: Take 20 mg by mouth daily.)   budesonide (PULMICORT) 0.5 MG/2ML nebulizer solution Take 2 mLs by nebulization daily. (Patient not taking: Reported on 06/27/2024)   Cholecalciferol 1.25 MG (50000 UT) capsule Take 50,000 Units by mouth once a week. (Patient not taking: Reported on 10/27/2024)   exemestane  (AROMASIN ) 25 MG tablet Take 25 mg by mouth daily after breakfast. (Patient not taking: Reported on 10/27/2024)   NON FORMULARY Takes weekly allergy shots (Patient not taking: Reported on 10/27/2024)   No facility-administered medications prior to visit.   "

## 2024-10-28 ENCOUNTER — Encounter: Payer: Self-pay | Admitting: Internal Medicine

## 2024-10-28 LAB — CBC WITH DIFFERENTIAL/PLATELET
Basophils Absolute: 0 x10E3/uL (ref 0.0–0.2)
Basos: 1 %
EOS (ABSOLUTE): 0.2 x10E3/uL (ref 0.0–0.4)
Eos: 4 %
Hematocrit: 40.2 % (ref 34.0–46.6)
Hemoglobin: 12.6 g/dL (ref 11.1–15.9)
Immature Grans (Abs): 0 x10E3/uL (ref 0.0–0.1)
Immature Granulocytes: 0 %
Lymphocytes Absolute: 1.9 x10E3/uL (ref 0.7–3.1)
Lymphs: 38 %
MCH: 26.5 pg — ABNORMAL LOW (ref 26.6–33.0)
MCHC: 31.3 g/dL — ABNORMAL LOW (ref 31.5–35.7)
MCV: 85 fL (ref 79–97)
Monocytes Absolute: 0.5 x10E3/uL (ref 0.1–0.9)
Monocytes: 11 %
Neutrophils Absolute: 2.2 x10E3/uL (ref 1.4–7.0)
Neutrophils: 45 %
Platelets: 277 x10E3/uL (ref 150–450)
RBC: 4.76 x10E6/uL (ref 3.77–5.28)
RDW: 14 % (ref 11.7–15.4)
WBC: 4.9 x10E3/uL (ref 3.4–10.8)

## 2024-10-28 LAB — LIPID PANEL W/O CHOL/HDL RATIO
Cholesterol, Total: 188 mg/dL (ref 100–199)
HDL: 59 mg/dL
LDL Chol Calc (NIH): 113 mg/dL — ABNORMAL HIGH (ref 0–99)
Triglycerides: 87 mg/dL (ref 0–149)
VLDL Cholesterol Cal: 16 mg/dL (ref 5–40)

## 2024-10-28 LAB — CMP14+EGFR
ALT: 19 IU/L (ref 0–32)
AST: 20 IU/L (ref 0–40)
Albumin: 4.5 g/dL (ref 3.8–4.9)
Alkaline Phosphatase: 84 IU/L (ref 49–135)
BUN/Creatinine Ratio: 10 — ABNORMAL LOW (ref 12–28)
BUN: 11 mg/dL (ref 8–27)
Bilirubin Total: 0.5 mg/dL (ref 0.0–1.2)
CO2: 25 mmol/L (ref 20–29)
Calcium: 9.9 mg/dL (ref 8.7–10.3)
Chloride: 104 mmol/L (ref 96–106)
Creatinine, Ser: 1.06 mg/dL — ABNORMAL HIGH (ref 0.57–1.00)
Globulin, Total: 2.5 g/dL (ref 1.5–4.5)
Glucose: 97 mg/dL (ref 70–99)
Potassium: 4.4 mmol/L (ref 3.5–5.2)
Sodium: 141 mmol/L (ref 134–144)
Total Protein: 7 g/dL (ref 6.0–8.5)
eGFR: 60 mL/min/1.73

## 2024-10-28 LAB — HEMOGLOBIN A1C
Est. average glucose Bld gHb Est-mCnc: 126 mg/dL
Hgb A1c MFr Bld: 6 % — ABNORMAL HIGH (ref 4.8–5.6)

## 2024-10-30 ENCOUNTER — Ambulatory Visit: Payer: Self-pay | Admitting: Internal Medicine

## 2024-10-30 DIAGNOSIS — E782 Mixed hyperlipidemia: Secondary | ICD-10-CM

## 2024-10-30 MED ORDER — EZETIMIBE 10 MG PO TABS: 10.0000 mg | ORAL_TABLET | Freq: Every day | ORAL | 3 refills | Status: AC

## 2024-12-09 ENCOUNTER — Other Ambulatory Visit: Payer: Self-pay | Admitting: Internal Medicine

## 2024-12-09 DIAGNOSIS — Z170 Estrogen receptor positive status [ER+]: Secondary | ICD-10-CM

## 2024-12-14 ENCOUNTER — Other Ambulatory Visit: Payer: Self-pay | Admitting: *Deleted

## 2024-12-14 DIAGNOSIS — C50512 Malignant neoplasm of lower-outer quadrant of left female breast: Secondary | ICD-10-CM

## 2024-12-15 ENCOUNTER — Inpatient Hospital Stay: Admitting: Internal Medicine

## 2024-12-15 ENCOUNTER — Inpatient Hospital Stay

## 2024-12-15 ENCOUNTER — Encounter: Payer: Self-pay | Admitting: Internal Medicine

## 2024-12-15 VITALS — BP 131/84 | HR 74 | Temp 97.1°F | Resp 18 | Ht 68.0 in | Wt 175.4 lb

## 2024-12-15 DIAGNOSIS — Z170 Estrogen receptor positive status [ER+]: Secondary | ICD-10-CM

## 2024-12-15 LAB — CMP (CANCER CENTER ONLY)
ALT: 19 U/L (ref 0–44)
AST: 21 U/L (ref 15–41)
Albumin: 4.4 g/dL (ref 3.5–5.0)
Alkaline Phosphatase: 83 U/L (ref 38–126)
Anion gap: 10 (ref 5–15)
BUN: 13 mg/dL (ref 6–20)
CO2: 25 mmol/L (ref 22–32)
Calcium: 9.5 mg/dL (ref 8.9–10.3)
Chloride: 107 mmol/L (ref 98–111)
Creatinine: 0.81 mg/dL (ref 0.44–1.00)
GFR, Estimated: >60 mL/min
Glucose, Bld: 95 mg/dL (ref 70–99)
Potassium: 4.5 mmol/L (ref 3.5–5.1)
Sodium: 142 mmol/L (ref 135–145)
Total Bilirubin: 0.4 mg/dL (ref 0.0–1.2)
Total Protein: 7.1 g/dL (ref 6.5–8.1)

## 2024-12-15 LAB — CBC WITH DIFFERENTIAL (CANCER CENTER ONLY)
Abs Immature Granulocytes: 0.02 10*3/uL (ref 0.00–0.07)
Basophils Absolute: 0 10*3/uL (ref 0.0–0.1)
Basophils Relative: 0 %
Eosinophils Absolute: 0.2 10*3/uL (ref 0.0–0.5)
Eosinophils Relative: 4 %
HCT: 39.3 % (ref 36.0–46.0)
Hemoglobin: 12.6 g/dL (ref 12.0–15.0)
Immature Granulocytes: 0 %
Lymphocytes Relative: 42 %
Lymphs Abs: 1.9 10*3/uL (ref 0.7–4.0)
MCH: 26.1 pg (ref 26.0–34.0)
MCHC: 32.1 g/dL (ref 30.0–36.0)
MCV: 81.4 fL (ref 80.0–100.0)
Monocytes Absolute: 0.4 10*3/uL (ref 0.1–1.0)
Monocytes Relative: 8 %
Neutro Abs: 2 10*3/uL (ref 1.7–7.7)
Neutrophils Relative %: 46 %
Platelet Count: 265 10*3/uL (ref 150–400)
RBC: 4.83 MIL/uL (ref 3.87–5.11)
RDW: 13.9 % (ref 11.5–15.5)
WBC Count: 4.5 10*3/uL (ref 4.0–10.5)
nRBC: 0 % (ref 0.0–0.2)

## 2024-12-15 LAB — VITAMIN D 25 HYDROXY (VIT D DEFICIENCY, FRACTURES): Vit D, 25-Hydroxy: 53.6 ng/mL (ref 30–100)

## 2024-12-15 NOTE — Assessment & Plan Note (Addendum)
#   APRIL 2022- Left breast- CA- s/p mastectomy-  pT1c pN0 [Stage IA]Grade 3.  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  S/p  adjuvant Herceptin  [april 2023]. STABLE;  s/p Adjuvant herceptin  [finished April 2023]. JUNE 2025- Diagnostic Mammogram Right Unilateral- WNL- stable.   # currently on Letrozole -  Tolerating well.  Continue letrozole .  # nose bleeds-no systemic causes.  Likely nasal dryness.  Recommend moisturizing/emollients  # Musculoskeletal G-1- tolerating well Letrozole - stable  # 2022-BMD- 2024- T-score of -0.2. 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   # IV access: PIV:   # DISPOSITION:  #  mammo-June 2026.  #  follow up in 6 month   MD; labs- cbc/cmp; vit D 25-OH level- -- Dr.B

## 2024-12-15 NOTE — Progress Notes (Signed)
 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   # NOV 20th, 2024- start letrozole + chrondoitin biflex  # MUGA scan- 64% [03/06/2021]-   # LMP- mid 2020; Uterine ablation- [phone]; # HTN; Asthma- well controlled [on allergy shots];SABRA    Malignant neoplasm of lower-outer quadrant of left breast of female, estrogen receptor positive (HCC)  12/25/2020 Initial Diagnosis   Malignant neoplasm of lower-outer quadrant of left breast of female, estrogen receptor positive (HCC)   02/26/2021 Cancer Staging   Staging form: Breast, AJCC 8th Edition - Pathologic: Stage IA (pT1c, pN0, cM0, G3, ER+, PR+, HER2+) - Signed by Rennie Cindy SAUNDERS, MD on 02/26/2021 Mitotic count score: Score 3 Histologic grading system: 3 grade system   03/10/2021 -  Chemotherapy   Patient is on Treatment Plan : BREAST Paclitaxel  + Trastuzumab  q7d / Trastuzumab  q21d      Genetic Testing   Negative genetic testing. No pathogenic variants identified on the [facility] Multi-Cancer+RNA panel. 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 The report date is 04/29/2021.  The Multi-Cancer Panel + RNA offered by [facility] includes sequencing and/or deletion duplication testing of the following 84 genes:  AIP, ALK, APC, ATM, AXIN2,BAP1,  BARD1, BLM, BMPR1A, BRCA1, BRCA2, BRIP1, CASR, CDC73, CDH1, CDK4, CDKN1B, CDKN1C, CDKN2A (p14ARF), CDKN2A (p16INK4a), CEBPA, CHEK2, CTNNA1, DICER1, DIS3L2, EGFR (c.2369C>T, p.Thr790Met variant only), EPCAM (Deletion/duplication testing only), FH, FLCN, GATA2, GPC3, GREM1 (Promoter region deletion/duplication testing only), HOXB13 (c.251G>A, p.Gly84Glu), HRAS, KIT, MAX, MEN1, MET, MITF (c.952G>A, p.Glu318Lys variant only), MLH1, MSH2, MSH3, MSH6, MUTYH, NBN, NF1, NF2, NTHL1, PALB2, PDGFRA, PHOX2B, PMS2, POLD1, POLE, POT1, PRKAR1A, PTCH1, PTEN, RAD50, RAD51C, RAD51D, RB1, RECQL4, RET, RUNX1, SDHAF2, SDHA (sequence changes only), SDHB, SDHC, SDHD, SMAD4, SMARCA4, SMARCB1, SMARCE1, STK11, SUFU, TERC, TERT, TMEM127, TP53, TSC1, TSC2, VHL, WRN and WT1.    HISTORY OF PRESENTING ILLNESS: Alone.  Ambulating independently.  Norvel LITTIE Bramble 59 y.o.  female newly diagnosed breast cancer stage I ER/PR positive HER2/neu positive s/p adjuvant Herceptin ; most recently on letrozole  is here for follow-up.   Discussed the use of AI scribe software for clinical note transcription with the patient, who gave verbal consent to proceed.  History of Present Illness   Judy Padilla is a 59 year old female with early stage ER/PR positive, HER2 positive breast cancer who presents for routine oncology follow-up.  She is currently receiving adjuvant letrozole  for early stage ER/PR positive, HER2 positive breast cancer. She reports no joint pain or other problems while taking letrozole . She denies pain, significant weight changes, or new breast symptoms. She is due for her next mammogram in June.  She recently resumed vitamin D  supplementation (50,000 units weekly) after a brief lapse. She is awaiting updated vitamin D  laboratory results.  Over the past several months, she has had intermittent episodes of epistaxis associated with nasal congestion, typically occurring after  forceful nose  blowing. She attributes these episodes to environmental factors and possible allergies in her new home. She and her husband use two humidifiers and maintain a lower bedroom temperature at night to reduce nasal dryness. She denies other bleeding symptoms or systemic complaints.       Review of Systems  Constitutional:  Negative for chills, diaphoresis, fever, malaise/fatigue and weight loss.  HENT:  Negative for nosebleeds and sore throat.   Eyes:  Negative for double vision.  Respiratory:  Negative for cough, hemoptysis, sputum production, shortness of breath and wheezing.   Cardiovascular:  Negative for chest pain, palpitations, orthopnea and leg swelling.  Gastrointestinal:  Negative for abdominal pain, blood in stool, constipation, diarrhea, heartburn, melena, nausea and vomiting.  Genitourinary:  Negative for dysuria, frequency and urgency.  Musculoskeletal:  Negative for back pain and joint pain.  Skin: Negative.  Negative for itching and rash.  Neurological:  Negative for dizziness, tingling, focal weakness, weakness and headaches.  Endo/Heme/Allergies:  Does not bruise/bleed easily.  Psychiatric/Behavioral:  Negative for depression. The patient is not nervous/anxious and does not have insomnia.      MEDICAL HISTORY:  Past Medical History:  Diagnosis Date   Asthma    well controlled   Breast cancer (HCC)    Family history of adverse reaction to anesthesia    sister-hard time waking up   Family history of breast cancer    Family history of prostate cancer    Family history of uterine cancer    GERD (gastroesophageal reflux disease)    Hypertension    Personal history of chemotherapy     SURGICAL HISTORY: Past Surgical History:  Procedure Laterality Date   ABLATION     BREAST BIOPSY Left 2011   Benign per pt   BREAST BIOPSY Left 12/12/2020   3:30 3 cmfn, Q marker, pos   BREAST BIOPSY Left 12/12/2020   3:30 1 cmfn, Vision marker, pos   BREAST RECONSTRUCTION WITH  PLACEMENT OF TISSUE EXPANDER AND FLEX HD (ACELLULAR HYDRATED DERMIS) Left 02/10/2021   Procedure: IMMEDIATE LEFT BREAST RECONSTRUCTION WITH PLACEMENT OF TISSUE EXPANDER AND FLEX HD (ACELLULAR HYDRATED DERMIS);  Surgeon: Lowery Estefana RAMAN, DO;  Location: [facility];  Service: Plastics;  Laterality: Left;   COLONOSCOPY  01/15/2020   MASTECTOMY Left 2022   PORTACATH PLACEMENT Right 02/10/2021   Procedure: INSERTION PORT-A-CATH;  Surgeon: Lane Shope, MD;  Location: [facility];  Service: General;  Laterality: Right;   REMOVAL OF TISSUE EXPANDER AND PLACEMENT OF IMPLANT Left 07/09/2021   Procedure: REMOVAL OF TISSUE EXPANDER AND PLACEMENT OF IMPLANT LEFT BREAST;  Surgeon: Lowery Estefana RAMAN, DO;  Location: [facility];  Service: Plastics;  Laterality: Left;   SIMPLE MASTECTOMY WITH AXILLARY SENTINEL NODE BIOPSY Left 02/10/2021   Procedure: SIMPLE MASTECTOMY WITH AXILLARY SENTINEL NODE BIOPSY;  Surgeon: Lane Shope, MD;  Location: [facility];  Service: General;  Laterality: Left;    SOCIAL HISTORY: Social History   Socioeconomic History   Marital status: Married    Spouse name: Not on file   Number of children: Not on file   Years of education: Not on file   Highest education level: Not on file  Occupational History   Not on file  Tobacco Use   Smoking status: Never   Smokeless tobacco: Never  Vaping Use   Vaping status: Never Used  Substance and Sexual Activity   Alcohol use: Not Currently   Drug use: Never   Sexual activity: Not on file  Other Topics Concern   Not on file  Social History Narrative   Lives in [place] with husband; kids- college. Works for [employer]- working from home. No smoking or alcohol.    Social Drivers of Health   Tobacco Use: Low Risk (12/15/2024)   Patient History    Smoking Tobacco Use: Never    Smokeless Tobacco Use: Never    Passive Exposure: Not on file  Financial Resource Strain: Not on file  Food  Insecurity: Not on file  Transportation Needs: Not on file  Physical Activity: Not on file  Stress: Not on file  Social Connections: Not on file  Intimate Partner Violence: Not on file  Depression (PHQ2-9): Low Risk (12/15/2024)   Depression (PHQ2-9)    PHQ-2 Score: 0  Alcohol Screen: Not on file  Housing: Unknown (11/23/2024)   Received from [facility]   Epic    Unable to Pay for Housing in the Last Year: Not on file    Number of Times Moved in the Last Year: Not on file    At any time in the past 12 months, were you homeless or living in a shelter (including now)?: No  Utilities: Not on file  Health Literacy: Not on file    FAMILY HISTORY: Family History  Problem Relation Age of Onset   Hypertension Mother    Diabetes Mother    Hypertension Father    Diabetes Father    Cancer Father        prostate cancer-70s   Cancer Maternal Grandmother        uterine cancer   Breast cancer Sister        in in 80s.     ALLERGIES:  is allergic to shrimp extract and other.  MEDICATIONS:  Current Outpatient Medications  Medication Sig Dispense Refill   albuterol (VENTOLIN HFA) 108 (90 Base) MCG/ACT inhaler Inhale 1 puff into the lungs every 6 (six) hours as needed for shortness of breath.     amLODipine (NORVASC) 10 MG tablet TAKE 1 TABLET BY MOUTH EVERY DAY 90 tablet 3   cetirizine (ZYRTEC) 10 MG tablet Take 10 mg by mouth daily as needed.     DUPIXENT 300 MG/2ML SOPN Inject into the skin.     EPINEPHrine  0.3 mg/0.3 mL IJ SOAJ injection Inject 0.3 mg into the muscle as needed for anaphylaxis.     fluticasone (FLONASE) 50 MCG/ACT nasal spray Place 1 spray into both nostrils daily as needed for allergies.     letrozole  (FEMARA ) 2.5 MG tablet TAKE 1 TABLET BY MOUTH EVERY DAY 90 tablet 1   Multiple Vitamins-Minerals (CENTRUM ADULTS) TABS Take 1 tablet by mouth daily.     pantoprazole (PROTONIX) 40 MG tablet TAKE 1 TABLET BY MOUTH EVERY DAY 90 tablet 3   rosuvastatin  (CRESTOR) 40 MG tablet TAKE 1 TABLET BY MOUTH EVERY DAY (Patient taking differently: Take 20 mg by mouth daily.) 90 tablet 3   ezetimibe  (ZETIA ) 10 MG tablet Take 1 tablet (10 mg total) by mouth daily. (Patient not taking: Reported on 12/15/2024) 90 tablet 3   No current facility-administered medications for this visit.      SABRA  PHYSICAL EXAMINATION: ECOG PERFORMANCE STATUS: 0 - Asymptomatic  Vitals:   12/15/24 0906  BP: 131/84  Pulse: 74  Resp: 18  Temp: (!) 97.1 F (36.2 C)  SpO2: 100%    Filed Weights   12/15/24 0906  Weight: 175 lb 6.4 oz (79.6 kg)  Physical Exam HENT:     Head: Normocephalic and atraumatic.     Mouth/Throat:     Pharynx: No oropharyngeal exudate.  Eyes:     Pupils: Pupils are equal, round, and reactive to light.  Cardiovascular:     Rate and Rhythm: Normal rate and regular rhythm.  Pulmonary:     Effort: Pulmonary effort is normal. No respiratory distress.     Breath sounds: Normal breath sounds. No wheezing.  Abdominal:     General: Bowel sounds are normal. There is no distension.     Palpations: Abdomen is soft. There is no mass.     Tenderness: There is no abdominal tenderness. There is no guarding or rebound.  Musculoskeletal:        General: No tenderness. Normal range of motion.     Cervical back: Normal range of motion and neck supple.  Skin:    General: Skin is warm.  Neurological:     Mental Status: She is alert and oriented to person, place, and time.  Psychiatric:        Mood and Affect: Affect normal.      LABORATORY DATA:  I have reviewed the data as listed Lab Results  Component Value Date   WBC 4.5 12/15/2024   HGB 12.6 12/15/2024   HCT 39.3 12/15/2024   MCV 81.4 12/15/2024   PLT 265 12/15/2024   Recent Labs    06/13/24 0936 06/27/24 1049 10/27/24 1010 12/15/24 0923  NA 138 142 141 142  K 4.0 4.7 4.4 4.5  CL 107 107* 104 107  CO2 24 23 25 25   GLUCOSE 100* 92 97 95  BUN 19 19 11 13   CREATININE 0.70 0.86  1.06* 0.81  CALCIUM 9.3 9.6 9.9 9.5  GFRNONAA >60  --   --  >60  PROT 7.0 6.6 7.0 7.1  ALBUMIN 4.3 4.4 4.5 4.4  AST 17 17 20 21   ALT 23 17 19 19   ALKPHOS 73 87 84 83  BILITOT 0.5 0.4 0.5 0.4    RADIOGRAPHIC STUDIES: I have personally reviewed the radiological images as listed and agreed with the findings in the report. No results found.  ASSESSMENT & PLAN:   Malignant neoplasm of lower-outer quadrant of left breast of female, estrogen receptor positive (HCC) # APRIL 2022- Left breast- CA- s/p mastectomy-  pT1c pN0 [Stage IA]Grade 3.  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  S/p  adjuvant Herceptin  [april 2023]. STABLE;  s/p Adjuvant herceptin  [finished April 2023]. JUNE 2025- Diagnostic Mammogram Right Unilateral- WNL- stable.   # currently on Letrozole -  Tolerating well.  Continue letrozole .  # Musculoskeletal G-1- tolerating well Letrozole - stable  # 2022-BMD- 2024- T-score of -0.2. 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   # IV access: PIV:   # DISPOSITION:  #  mammo-June 2026.  #  follow up in 6 month   MD; labs- cbc/cmp; vit D 25-OH level- -- Dr.B      All questions were answered. The patient/family knows to call the clinic with any problems, questions or concerns.    Cindy JONELLE Joe, MD 12/15/2024 10:26 AM

## 2024-12-15 NOTE — Progress Notes (Signed)
 C/o nose bleeds for a month.

## 2025-03-02 ENCOUNTER — Ambulatory Visit: Admitting: Internal Medicine

## 2025-04-20 ENCOUNTER — Encounter

## 2025-06-15 ENCOUNTER — Inpatient Hospital Stay

## 2025-06-15 ENCOUNTER — Inpatient Hospital Stay: Admitting: Internal Medicine

## 2025-07-27 ENCOUNTER — Ambulatory Visit: Admitting: Plastic Surgery
